# Patient Record
Sex: Male | Born: 1950 | Race: White | Hispanic: No | Smoking: Current some day smoker
Health system: Southern US, Community
[De-identification: ages and names within clinical notes are randomized; demographics above are authoritative.]

## PROBLEM LIST (undated history)

## (undated) DIAGNOSIS — R05 Cough: Secondary | ICD-10-CM

## (undated) DIAGNOSIS — I255 Ischemic cardiomyopathy: Secondary | ICD-10-CM

## (undated) DIAGNOSIS — E785 Hyperlipidemia, unspecified: Secondary | ICD-10-CM

## (undated) DIAGNOSIS — I519 Heart disease, unspecified: Secondary | ICD-10-CM

## (undated) DIAGNOSIS — I5022 Chronic systolic (congestive) heart failure: Secondary | ICD-10-CM

## (undated) DIAGNOSIS — Z95810 Presence of automatic (implantable) cardiac defibrillator: Secondary | ICD-10-CM

## (undated) DIAGNOSIS — G8929 Other chronic pain: Secondary | ICD-10-CM

## (undated) DIAGNOSIS — I480 Paroxysmal atrial fibrillation: Secondary | ICD-10-CM

## (undated) DIAGNOSIS — I2510 Atherosclerotic heart disease of native coronary artery without angina pectoris: Secondary | ICD-10-CM

## (undated) DIAGNOSIS — K829 Disease of gallbladder, unspecified: Secondary | ICD-10-CM

## (undated) DIAGNOSIS — N200 Calculus of kidney: Secondary | ICD-10-CM

## (undated) DIAGNOSIS — I482 Chronic atrial fibrillation, unspecified: Secondary | ICD-10-CM

## (undated) HISTORY — DX: Disease of gallbladder, unspecified: K82.9

## (undated) HISTORY — PX: CHOLECYSTECTOMY: SHX55

## (undated) HISTORY — DX: Calculus of kidney: N20.0

## (undated) HISTORY — DX: Heart disease, unspecified: I51.9

## (undated) HISTORY — DX: Paroxysmal atrial fibrillation: I48.0

## (undated) HISTORY — DX: Ischemic cardiomyopathy: I25.5

## (undated) HISTORY — DX: Atherosclerotic heart disease of native coronary artery without angina pectoris: I25.10

## (undated) HISTORY — DX: Other chronic pain: G89.29

---

## 2000-09-18 ENCOUNTER — Inpatient Hospital Stay (HOSPITAL_COMMUNITY): Admission: RE | Admit: 2000-09-18 | Discharge: 2000-09-19 | Payer: Self-pay | Admitting: Cardiology

## 2000-09-19 ENCOUNTER — Encounter: Payer: Self-pay | Admitting: Cardiology

## 2001-12-23 ENCOUNTER — Ambulatory Visit (HOSPITAL_COMMUNITY): Admission: RE | Admit: 2001-12-23 | Discharge: 2001-12-23 | Payer: Self-pay

## 2006-12-21 ENCOUNTER — Ambulatory Visit: Payer: Self-pay | Admitting: Internal Medicine

## 2006-12-21 ENCOUNTER — Inpatient Hospital Stay (HOSPITAL_COMMUNITY): Admission: EM | Admit: 2006-12-21 | Discharge: 2006-12-24 | Payer: Self-pay | Admitting: Emergency Medicine

## 2006-12-21 ENCOUNTER — Ambulatory Visit: Payer: Self-pay | Admitting: Cardiology

## 2006-12-25 ENCOUNTER — Ambulatory Visit: Payer: Self-pay | Admitting: Cardiology

## 2006-12-31 ENCOUNTER — Observation Stay (HOSPITAL_COMMUNITY): Admission: RE | Admit: 2006-12-31 | Discharge: 2007-01-01 | Payer: Self-pay | Admitting: Internal Medicine

## 2006-12-31 ENCOUNTER — Ambulatory Visit: Payer: Self-pay | Admitting: Internal Medicine

## 2006-12-31 HISTORY — PX: OTHER SURGICAL HISTORY: SHX169

## 2007-01-22 ENCOUNTER — Ambulatory Visit: Payer: Self-pay | Admitting: Internal Medicine

## 2007-01-26 ENCOUNTER — Ambulatory Visit: Payer: Self-pay | Admitting: Internal Medicine

## 2007-05-17 ENCOUNTER — Ambulatory Visit: Payer: Self-pay | Admitting: Internal Medicine

## 2007-07-16 ENCOUNTER — Ambulatory Visit: Payer: Self-pay | Admitting: Cardiology

## 2008-02-11 ENCOUNTER — Ambulatory Visit: Payer: Self-pay | Admitting: Internal Medicine

## 2008-03-28 ENCOUNTER — Ambulatory Visit: Payer: Self-pay | Admitting: Cardiology

## 2008-10-24 ENCOUNTER — Encounter: Payer: Self-pay | Admitting: Internal Medicine

## 2009-01-08 ENCOUNTER — Ambulatory Visit: Payer: Self-pay | Admitting: Cardiology

## 2009-02-05 ENCOUNTER — Encounter: Payer: Self-pay | Admitting: Cardiology

## 2009-02-07 ENCOUNTER — Encounter: Payer: Self-pay | Admitting: Internal Medicine

## 2009-02-07 ENCOUNTER — Ambulatory Visit: Payer: Self-pay | Admitting: Internal Medicine

## 2009-02-19 ENCOUNTER — Ambulatory Visit: Payer: Self-pay | Admitting: Cardiology

## 2009-02-28 ENCOUNTER — Encounter: Payer: Self-pay | Admitting: Physician Assistant

## 2009-02-28 ENCOUNTER — Encounter: Payer: Self-pay | Admitting: Cardiology

## 2009-03-07 ENCOUNTER — Ambulatory Visit: Payer: Self-pay | Admitting: Cardiology

## 2009-04-19 DIAGNOSIS — Z95810 Presence of automatic (implantable) cardiac defibrillator: Secondary | ICD-10-CM

## 2009-04-19 DIAGNOSIS — I255 Ischemic cardiomyopathy: Secondary | ICD-10-CM

## 2009-04-19 HISTORY — DX: Presence of automatic (implantable) cardiac defibrillator: Z95.810

## 2009-08-01 ENCOUNTER — Encounter: Payer: Self-pay | Admitting: Internal Medicine

## 2010-04-15 ENCOUNTER — Encounter (INDEPENDENT_AMBULATORY_CARE_PROVIDER_SITE_OTHER): Payer: Self-pay | Admitting: *Deleted

## 2010-06-11 ENCOUNTER — Encounter: Payer: Self-pay | Admitting: Cardiology

## 2010-06-13 ENCOUNTER — Encounter (INDEPENDENT_AMBULATORY_CARE_PROVIDER_SITE_OTHER): Payer: Self-pay | Admitting: *Deleted

## 2010-06-14 ENCOUNTER — Encounter: Payer: Self-pay | Admitting: Cardiology

## 2010-07-01 ENCOUNTER — Encounter: Payer: Self-pay | Admitting: Cardiology

## 2010-07-03 ENCOUNTER — Encounter: Payer: Self-pay | Admitting: Cardiology

## 2010-07-09 ENCOUNTER — Telehealth (INDEPENDENT_AMBULATORY_CARE_PROVIDER_SITE_OTHER): Payer: Self-pay | Admitting: *Deleted

## 2010-07-17 ENCOUNTER — Encounter: Payer: Self-pay | Admitting: Cardiology

## 2010-07-17 ENCOUNTER — Telehealth (INDEPENDENT_AMBULATORY_CARE_PROVIDER_SITE_OTHER): Payer: Self-pay | Admitting: *Deleted

## 2010-07-17 ENCOUNTER — Ambulatory Visit: Payer: Self-pay | Admitting: Cardiology

## 2010-07-17 DIAGNOSIS — I482 Chronic atrial fibrillation, unspecified: Secondary | ICD-10-CM

## 2010-07-17 DIAGNOSIS — R9431 Abnormal electrocardiogram [ECG] [EKG]: Secondary | ICD-10-CM

## 2010-07-17 DIAGNOSIS — I5022 Chronic systolic (congestive) heart failure: Secondary | ICD-10-CM | POA: Insufficient documentation

## 2010-07-31 ENCOUNTER — Telehealth: Payer: Self-pay | Admitting: Cardiology

## 2010-08-07 ENCOUNTER — Encounter (INDEPENDENT_AMBULATORY_CARE_PROVIDER_SITE_OTHER): Payer: Self-pay | Admitting: *Deleted

## 2010-08-20 ENCOUNTER — Telehealth (INDEPENDENT_AMBULATORY_CARE_PROVIDER_SITE_OTHER): Payer: Self-pay | Admitting: *Deleted

## 2010-08-21 DIAGNOSIS — E785 Hyperlipidemia, unspecified: Secondary | ICD-10-CM | POA: Insufficient documentation

## 2010-08-21 HISTORY — DX: Hyperlipidemia, unspecified: E78.5

## 2010-08-27 NOTE — Letter (Signed)
Summary: Device-Delinquent Check  [facility], Main Office  [address]   [place], [place] [address]   Phone: [phone]  Fax: [phone]     April 15, 2010 MRN: [id number]   [facility] [address] [place], [place]  [address]   Dear Mr. PULLARA,  According to our records, you have not had your implanted device checked in the recommended period of time.  We are unable to determine appropriate device function without checking your device on a regular basis.  Please call our office to schedule an appointment, with Dr. Johney Frame in [place], as soon as possible.  If you are having your device checked by another physician, please call us so that we may update our records.  Thank you,  Altha Harm, LPN  April 15, 2010 9:34 AM  [facility]

## 2010-08-27 NOTE — Letter (Signed)
Summary: Device-Delinquent Check  [facility], Main Office  [address]   [place], [place] [address]   Phone: [phone]  Fax: [phone]     June 13, 2010 MRN: [id number]   [facility] [address] [place], [place]  [address]   Dear Mr. DEMARIA,  According to our records, you have not had your implanted device checked in the recommended period of time.  We are unable to determine appropriate device function without checking your device on a regular basis.  Please call our office to schedule an appointment, with Dr Graciela Husbands ,  as soon as possible.  If you are having your device checked by another physician, please call us so that we may update our records.  Thank you,  Letta Moynahan, EMT  June 13, 2010 11:28 AM  [facility]

## 2010-08-27 NOTE — Letter (Signed)
Summary: Appointment -missed  [facility] [address]   [place], [place] [address]   Phone: [phone]  Fax: [phone]     August 01, 2009 MRN: [id number]      [facility] [address] [place], [place]  [address]      Dear Mr. BOYTE,  Our records indicate you missed your appointment on August 01, 2009                        with Dr. Johney Frame.   It is very important that we reach you to reschedule this appointment. We look forward to participating in your health care needs.   Please contact us at the number listed above at your earliest convenience to reschedule this appointment.   Sincerely,    Glass blower/designer

## 2010-08-28 ENCOUNTER — Encounter: Payer: Self-pay | Admitting: Cardiology

## 2010-08-29 NOTE — Progress Notes (Signed)
Summary: clarification on medical clearance  Phone Note Other Incoming Call back at ext. [phone] or [phone]   Caller: Dr. Gabriel Cirri - [place]  Summary of Call: Need to know if patient is clear for surgery.  Office note does not specifically mention this.   Hoover Brunette, LPN  July 17, 2010 2:13 PM   Please do addendum to office note.   Hoover Brunette, LPN  July 23, 2010 9:54 AM   Follow-up for Phone Call        chest patient is clear for surgery., I will make an addendum to the office note Follow-up by: Lewayne Bunting, MD, [facility],  July 23, 2010 11:29 AM  Additional Follow-up for Phone Call Additional follow up Details #1::        Will fax note to Dr. Gabriel Cirri.  Additional Follow-up by: Hoover Brunette, LPN,  July 23, 2010 4:52 PM

## 2010-08-29 NOTE — Progress Notes (Signed)
Summary: needs ov for cardiac clearance for gallbldder surgery  Phone Note Other Incoming Call back at [phone]    Caller: [facility] Summary of Call: Needs OV ASAP.  Needs clearance for gallbladder surgery.  Advised them that pt had not been seen since 01/2009 and also had multiple delinquent pacer checks.  Advised their office that MD would have to advise where to put on schedule.  Nothing available till late January.   Hoover Brunette, LPN  July 09, 2010 5:05 PM    Follow-up for Phone Call        Can see tomorrow on 1/2 day otherwise will have to wait until 1st available.  Follow-up by: Lewayne Bunting, MD, [facility],  July 16, 2010 4:51 AM  Additional Follow-up for Phone Call Additional follow up Details #1::        Already scheduled for 12/21 with GD.    Additional Follow-up by: Hoover Brunette, LPN,  July 16, 2010 9:00 AM

## 2010-08-29 NOTE — Letter (Signed)
Summary: Device-Delinquent Check  [facility], Main Office  [address]   [place], [place] [address]   Phone: [phone]  Fax: [phone]     August 07, 2010 MRN: [id number]   [facility] [address] [place], [place]  [address]   Dear [name],  According to our records, you have not had your implanted device checked in the recommended period of time.  We are unable to determine appropriate device function without checking your device on a regular basis.  Please call our office to schedule an appointment. with Dr Johney Frame, as soon as possible.  If you are having your device checked by another physician, please call us so that we may update our records.  Thank you,  Letta Moynahan, EMT  August 07, 2010 4:08 PM  [facility]  certified

## 2010-08-29 NOTE — Assessment & Plan Note (Signed)
Summary: pre-op clearance for gallbladder -agh   Visit Type:  Pre-op Evaluation Primary Provider:  Sherryll Burger   History of Present Illness: is a 60 year old male with a history of coronary artery disease, status post ICD implantation for primary prevention. The patient has severe ischemic cardiomyopathy with an ejection fraction of 30-35%. He has a baseline abnormal EKG with an old inferior and old anterolateral wall infarct pattern. He had multiple prior PCI's including of the LAD, circumflex and right coronary artery. He has a history of paroxysmal atrial fibrillation. He also has a history of appropriate tachycardia pacing for ventricular tachycardia.  The patient has been referred for preoperative evaluation. For quite some time now he has been suffering from abdominal pain. As a matter of fact the patient while sitting in the office complained of severe right lower quadrant pain. He states his pain has been constant for the last couple of weeks. It is intermittent and spasmodic in nature. At times he also had left-sided abdominal pain. He has underwent a CT scan of the abdomen which showed no acute abnormality although he does have bilateral nonobstructive renal calculi with the largest measuring only 3 mm. In addition he had a nuclear medicine gallbladder ultrasound done in the [facility] with an ejection fraction of 16%. He was also admitted with abdominal pain and the patient is now being considered for cholecystectomy. He also had elevated INR at that time.  The patient reports to me that he needs to be seen for preoperative clearance. He was told that he had a very abnormal EKG. However his EKG is totally unchanged from prior with an old inferior and anterior infarct pattern. He denies however any chest pain. He has chronic dyspnea. He tells me that since they stopped his Coumadin and also normal his other cardiac medications have been restarted. The patient is also currently taking aspirin. He  anticipates that surgery will be soon.the patient is a prior workup with colonoscopy several years ago but he has a repeat colonoscopy scheduled. Blood pressure in the office was however that the patient appears to have symptoms of irritable bowel syndrome particularly in the context of increased stress. He has had several bouts in his family with his brother-in-law who died in his sister who just recently had a stroke.  Preventive Screening-Counseling & Management  Alcohol-Tobacco     Smoking Status: current     Smoking Cessation Counseling: yes     Packs/Day: 1/2 PPD  Current Medications (verified): 1)  Percocet 5-325 Mg Tabs (Oxycodone-Acetaminophen) .... Take 1 Tablet By Mouth Three Times A Day 2)  Coreg 6.25 Mg Tabs (Carvedilol) .... Take 1 Tablet By Mouth Two Times A Day 3)  Lasix 20 Mg Tabs (Furosemide) .... Take 1 Tablet By Mouth Once A Day 4)  Lisinopril 5 Mg Tabs (Lisinopril) .... Take 1/2 Tab (2.5mg ) Daily 5)  Clonazepam 0.5 Mg Tabs (Clonazepam) .... Take 1/2 Tab (0.25mg ) Two Times A Day 6)  Dicyclomine Hcl 20 Mg Tabs (Dicyclomine Hcl) .... Take 1 Tablet By Mouth Three Times A Day As Needed 30 Minutes Before Meals  Allergies (verified): No Known Drug Allergies  Comments:  Nurse/Medical Assistant: The patient is currently on no medications. Old medications were removed from the medication list. Patient said he was taken off all meds by  PCP 2 weeks ago.  Past History:  Past Medical History: ICD - IN SITU (ICD-V45.02) CARDIOMYOPATHY, ISCHEMIC (ICD-414.8) Ejection fraction 30-35% Multiple PCI's involving the LAD circumflex and right coronary artery History  of atrial fibrillation. Renal calculi decreased ejection fraction gallbladder.     Social History: Packs/Day:  1/2 PPD  Review of Systems       The patient complains of blood in stool and leg swelling.  The patient denies fatigue, malaise, fever, weight gain/loss, vision loss, decreased hearing, hoarseness, chest  pain, palpitations, shortness of breath, prolonged cough, wheezing, sleep apnea, coughing up blood, abdominal pain, nausea, vomiting, diarrhea, heartburn, incontinence, blood in urine, muscle weakness, joint pain, rash, skin lesions, headache, fainting, dizziness, depression, anxiety, enlarged lymph nodes, easy bruising or bleeding, and environmental allergies.         the patient reports frequent bloating. He is on a clear liquid diet. He states that he has acute diarrhea he eats anything like tomatoes or vegetables. Abdominal pain as described  Vital Signs:  Patient profile:   60 year old male Height:      67 inches Weight:      201 pounds BMI:     31.59 Pulse rate:   91 / minute BP sitting:   110 / 73  (left arm) Cuff size:   large  Vitals Entered By: Carlye Grippe (July 17, 2010 10:00 AM)  Nutrition Counseling: Patient's BMI is greater than 25 and therefore counseled on weight management options.  Physical Exam  Additional Exam:  General:patient reports abdominal pain, right-sided. head: Normocephalic and atraumatic eyes PERRLA/EOMI intact, conjunctiva and lids normal nose: No deformity or lesions mouth normal dentition, normal posterior pharynx neck: Supple, no JVD.  No masses, thyromegaly or abnormal cervical nodes lungs: Normal breath sounds bilaterally without wheezing.  Normal percussion Chest: ICD well-positioned heart: regular rate and rhythm with normal S1 and S2, no S3 or S4.  PMI is normal.  No pathological murmurs abdomen: Normal bowel sounds, abdomen is soft and nontender without masses, organomegaly or hernias noted.  No hepatosplenomegaly, diffuse tenderness on palpation particularly right lower left lower quadrant. No rebound or guarding however. musculoskeletal: Back normal, normal gait muscle strength and tone normal pulsus: Pulse is normal in all 4 extremities Extremities: No peripheral pitting edema neurologic: Alert and oriented x 3 skin: Intact without  lesions or rashes cervical nodes: No significant adenopathy psychologic: Normal affect    EKG  Procedure date:  07/17/2010  Findings:      normal sinus rhythm. Inferior Q waves as well as anterolateral Q waves consistent with old inferior and anterior/lateral wall myocardial infarction   ICD Specifications Following MD:  Sherryl Manges, MD     ICD Vendor:  [employer] Jude     ICD Model Number:  [id number]     ICD Serial Number:  [id number] ICD DOI:  12/31/2006     ICD Implanting MD:  Sherryl Manges, MD  Lead 1:    Location: RA     DOI: 12/31/2006     Model #: [id number]     Serial #: [id number]     Status: active Lead 2:    Location: RV     DOI: 12/31/2006     Model #: [id number]     Serial #: [id number]     Status: active  Indications::  ICM, PAF   ICD Follow Up ICD Dependent:  No      Brady Parameters Mode DDD     Lower Rate Limit:  40     Upper Rate Limit 120 PAV 200     Sensed AV Delay:  180  Tachy Zones VF:  190     VT:  210  VT1:  240     Impression & Recommendations:  Problem # 1:  ELECTROCARDIOGRAM, ABNORMAL (ICD-794.31) EKG is totally unchanged from prior tracing. The patient has known ischemic cardiomyopathy with inferior and anterolateral Q waves by EKG. His ejection fraction is 35% The following medications were removed from the medication list:    Nitroglycerin 0.4 Mg Subl (Nitroglycerin) .Marland Kitchen... Dissolve one tab under tongue every 5 minutes as needed for severe chest pain, not to exceed 3 in 15 min time frame    Metoprolol Succinate 100 Mg Xr24h-tab (Metoprolol succinate) .Marland Kitchen... Take one tablet by mouth daily His updated medication list for this problem includes:    Coreg 6.25 Mg Tabs (Carvedilol) .Marland Kitchen... Take 1 tablet by mouth two times a day    Lisinopril 5 Mg Tabs (Lisinopril) .Marland Kitchen... Take 1/2 tab (2.5mg ) daily  Orders: EKG w/ Interpretation ([address])Future Orders: T-Basic Metabolic Panel [id number]) ... 07/24/2010  Problem # 2:  ICD - IN SITU (ICD-V45.02) the patient reports no  intercurrent discharges. His ICD will need to be disabled at the time of surgery. St. Jude can be called for this at the time of surgery.  Problem # 3:  CHRONIC SYSTOLIC HEART FAILURE (ICD-428.22) the patient has evidence of volume overload. He has lower extremity edema and he is currently not on any cardiac medications. Start the patient on Coreg 6.25 mg twice a day, Lasix 20 mg p.o. q. daily, lisinopril 2.5. Q. daily and we will obtain an electrolyte panel in one week. The following medications were removed from the medication list:    Nitroglycerin 0.4 Mg Subl (Nitroglycerin) .Marland Kitchen... Dissolve one tab under tongue every 5 minutes as needed for severe chest pain, not to exceed 3 in 15 min time frame    Metoprolol Succinate 100 Mg Xr24h-tab (Metoprolol succinate) .Marland Kitchen... Take one tablet by mouth daily    Digoxin 0.125 Mg Tabs (Digoxin) .Marland Kitchen... Take 1 tablet by mouth once a day His updated medication list for this problem includes:    Coreg 6.25 Mg Tabs (Carvedilol) .Marland Kitchen... Take 1 tablet by mouth two times a day    Lasix 20 Mg Tabs (Furosemide) .Marland Kitchen... Take 1 tablet by mouth once a day    Lisinopril 5 Mg Tabs (Lisinopril) .Marland Kitchen... Take 1/2 tab (2.5mg ) daily  Future Orders: T-Basic Metabolic Panel [id number]) ... 07/24/2010  Problem # 4:  ABDOMINAL PAIN OTHER SPECIFIED SITE (ICD-789.09) failure dramatic improvement in the patient's abdominal pain with dicyclomine in the office. Given the patient also prescription with dicyclomine 20 mg p.o. t.i.d. half an hour before meals in addition clonazepam p.o. b.i.d. there is a significant stress factor involved here as excess exacerbated the patient's presumed spastic colon. He may well have gallbladder disease but the pain that he was experiencing the office today is not consistent with gallbladder disease. He does have renal calculi but this has been evaluated by his primary care doctor. Future Orders: T-Basic Metabolic Panel [id number]) ...  07/24/2010  Problem # 5:  CARDIOMYOPATHY, ISCHEMIC (ICD-414.8) the patient denies any substernal chest pain. As long as he continued perioperatively on his medications he should be at relatively low risk for any cardiac complications. He does not need any ischemia testing. The following medications were removed from the medication list:    Nitroglycerin 0.4 Mg Subl (Nitroglycerin) .Marland Kitchen... Dissolve one tab under tongue every 5 minutes as needed for severe chest pain, not to exceed 3 in 15 min time frame    Metoprolol Succinate 100 Mg Xr24h-tab (Metoprolol succinate) .Marland KitchenMarland KitchenMarland KitchenMarland Kitchen  Take one tablet by mouth daily    Digoxin 0.125 Mg Tabs (Digoxin) .Marland Kitchen... Take 1 tablet by mouth once a day His updated medication list for this problem includes:    Coreg 6.25 Mg Tabs (Carvedilol) .Marland Kitchen... Take 1 tablet by mouth two times a day    Lasix 20 Mg Tabs (Furosemide) .Marland Kitchen... Take 1 tablet by mouth once a day    Lisinopril 5 Mg Tabs (Lisinopril) .Marland Kitchen... Take 1/2 tab (2.5mg ) daily  Problem # 6:  ATRIAL FIBRILLATION (ICD-427.31) patient has a history of atrial fibrillation but is in normal sinus rhythm. He has been taken off Coumadin. I suspect this is in anticipation of surgery. However patient will need to be started on Coumadin as soon as possible once surgery been completed. The following medications were removed from the medication list:    Metoprolol Succinate 100 Mg Xr24h-tab (Metoprolol succinate) .Marland Kitchen... Take one tablet by mouth daily    Digoxin 0.125 Mg Tabs (Digoxin) .Marland Kitchen... Take 1 tablet by mouth once a day His updated medication list for this problem includes:    Coreg 6.25 Mg Tabs (Carvedilol) .Marland Kitchen... Take 1 tablet by mouth two times a day  Patient Instructions: 1)  Coreg 6.25mg  two times a day 2)  Lasix 20mg  daily 3)  Lisinopril 2.5mg  daily 4)  Labs:  BMET in 7-10 days 5)  Clonazepam 0.25mg  two times a day  6)  Dicyclomine 20mg  30 minutes before meals as needed  7)  Follow up in  3  months Prescriptions: DICYCLOMINE HCL 20 MG TABS (DICYCLOMINE HCL) Take 1 tablet by mouth three times a day as needed 30 minutes before meals  #30 x 1   Entered by:   Hoover Brunette, LPN   Authorized by:   Lewayne Bunting, MD, [facility]   Signed by:   Hoover Brunette, LPN on 81/19/1478   Method used:   Electronically to        [facility] (retail)       [address]       [place], [place]  [address]       Ph: [phone]       Fax: [phone]   RxID:   [id number] CLONAZEPAM 0.5 MG TABS (CLONAZEPAM) take 1/2 tab (0.25mg ) two times a day  #15 x 0   Entered by:   Hoover Brunette, LPN   Authorized by:   Lewayne Bunting, MD, [facility]   Signed by:   Hoover Brunette, LPN on 34/74/2595   Method used:   Print then Give to Patient   RxID:   [id number] LISINOPRIL 5 MG TABS (LISINOPRIL) take 1/2 tab (2.5mg ) daily  #15 x 6   Entered by:   Hoover Brunette, LPN   Authorized by:   Lewayne Bunting, MD, [facility]   Signed by:   Hoover Brunette, LPN on 16/60/6301   Method used:   Electronically to        [facility] (retail)       [address]       [place], [place]  [address]       Ph: [phone]       Fax: [phone]   RxID:   [id number] LASIX 20 MG TABS (FUROSEMIDE) Take 1 tablet by mouth once a day  #30 x 6   Entered by:   Hoover Brunette, LPN   Authorized by:   Lewayne Bunting,  MD, [facility]   Signed by:   Hoover Brunette, LPN on 08/05/3233   Method used:   Electronically to        [facility] (retail)       [address]       [place], [place]  [address]       Ph: [phone]       Fax: [phone]   RxID:   [id number] COREG 6.25 MG TABS (CARVEDILOL) Take 1 tablet by mouth two times a day  #60 x 6   Entered by:   Hoover Brunette, LPN   Authorized by:   Lewayne Bunting, MD, [facility]   Signed by:   Hoover Brunette, LPN on 62/70/3500   Method used:   Electronically to        [facility] (retail)       [address]       [place], [place]   [address]       Ph: [phone]       Fax: [phone]   RxID:   [id number]    Medication Administration  Medication # 1:    Medication: Dicyclomine    Diagnosis: ABDOMINAL PAIN OTHER SPECIFIED SITE (ICD-789.09)    Dose: 20mg     Route: po    Comments: pain better on right than left     Patient tolerated medication without complications    Given by: Dr. Andee Lineman   Orders Added: 1)  T-Basic Metabolic Panel [id number] 2)  EKG w/ Interpretation [93[address]]   Appended Document: pre-op clearance for gallbladder -agh patient has significant underlying heart disease but is hemodynamically stable. He status post ICD implantation. He should be able to undergo gallbladder surgery at relatively low risk. ICD will need to be disabled perioperatively.

## 2010-08-29 NOTE — Progress Notes (Signed)
  Phone Note From Other Clinic   Caller: Dr. Justice Britain Summary of Call: I received a phone call from Dr. Gabriel Cirri regarding [name]. The patient is being scheduled for elective cholecystectomy. He is on Coumadin with a history of atrial fibrillation, actually off and on the medication over the last month. Dr. Marlene Bast explained that the patient had been back on Coumadin since his most recent visit with Dr. Andee Lineman and his INR level was subtherapeutic during a recent check with Dr. Sherryll Burger His typical Coumadin dose of 7.5 mg daily was augmented with a single 10 mg dose. Subsequent to this, Coumadin was held beginning approximately 4 days ago in anticipation of gallbladder surgery today, however a recheck INR was found to be 2.6 today and surgery was cancelled. Patient's history also includes ischemic cardiomyopathy with ICD. Dr. Gabriel Cirri and I discussed the situation, and I recommended that the patient continue to hold Coumadin with recheck INR on Friday. If INR level is still trending downward, could potentially reschedule surgery for Monday and coordinate ICD interrogation with a device representative on that day. I recommended that the Coumadin not be reversed with vitamin K at this point. Initial call taken by: Loreli Slot, MD, [facility],  July 31, 2010 1:15 PM     Appended Document:  Noted.

## 2010-08-29 NOTE — Medication Information (Signed)
Summary: [facility] D/C MEDICATION LIST   [facility] D/C MEDICATION LIST   Imported By: Zachary George 07/16/2010 14:23:09  _____________________________________________________________________  External Attachment:    Type:   Image     Comment:   External Document

## 2010-08-29 NOTE — Progress Notes (Signed)
Summary: Lipitor refill       Phone Note Call from Patient   Caller: VOICEMAIL MESSAGE  Summary of Call: Message left - needing Lipitor filled.  [facility].  Left message to return call.  Hoover Brunette, LPN  August 20, 2010 11:45 AM   Follow-up for Phone Call        States he was originally getting from [facility].  Stated that he would get refills from them for 3 years at a time.  At no point within that time frame did he have cholesterol labs to follow on this.  Lipitor was not on med list when here in December.   Would like to have [place] manage for him in the future.  Advised him to get labs at [facility] ([facility]) & after MD review we could supply med.  After review, GD may or may not decide to continue the Lipitor or give you a different med that he feels suitable for you.  Patient verbalized understanding.  Follow-up by: Hoover Brunette, LPN,  August 21, 2010 12:10 PM  Additional Follow-up for Phone Call Additional follow up Details #1::        yes I agree with that Additional Follow-up by: Lewayne Bunting, MD, [facility],  August 23, 2010 10:37 AM  New Problems: LONG-TERM (CURRENT) USE OF OTHER MEDICATIONS (ICD-V58.69) HYPERLIPIDEMIA (ICD-272.4)   New Problems: LONG-TERM (CURRENT) USE OF OTHER MEDICATIONS (ICD-V58.69) HYPERLIPIDEMIA (ICD-272.4)

## 2010-09-04 NOTE — Medication Information (Signed)
Summary: RX Folder/ [facility]PON[facility]  RX Folder/ [facility]ONSE   Imported By: Dorise Hiss 08/28/2010 15:31:53  _____________________________________________________________________  External Attachment:    Type:   Image     Comment:   External Document

## 2010-09-06 ENCOUNTER — Encounter: Payer: Self-pay | Admitting: Internal Medicine

## 2010-09-13 ENCOUNTER — Telehealth: Payer: Self-pay | Admitting: *Deleted

## 2010-09-24 NOTE — Cardiovascular Report (Signed)
Summary: Certified Letter Returned - Not doing f/u  Certified Letter Returned - Not doing f/u   Imported By: Debby Freiberg 09/18/2010 13:18:59  _____________________________________________________________________  External Attachment:    Type:   Image     Comment:   External Document

## 2010-10-11 ENCOUNTER — Encounter: Payer: Self-pay | Admitting: *Deleted

## 2010-10-23 ENCOUNTER — Ambulatory Visit: Payer: Self-pay | Admitting: Cardiology

## 2010-10-23 ENCOUNTER — Encounter: Payer: Self-pay | Admitting: Cardiology

## 2010-10-23 DIAGNOSIS — I2510 Atherosclerotic heart disease of native coronary artery without angina pectoris: Secondary | ICD-10-CM | POA: Insufficient documentation

## 2010-12-10 NOTE — Op Note (Signed)
[name], [name]             ACCOUNT NO.:  [id number]   MEDICAL RECORD NO.:  [id number]          PATIENT TYPE:  INP   LOCATION:  2852                         FACILITY:  [facility]   PHYSICIAN:  Duke Salvia, MD, FACCDATE OF BIRTH:  [birth year]   DATE OF PROCEDURE:  12/31/2006  DATE OF DISCHARGE:                               OPERATIVE REPORT   PREOPERATIVE DIAGNOSIS:  Ischemic cardiomyopathy, paroxysmal atrial  fibrillation.   POSTOPERATIVE DIAGNOSIS:  Ischemic cardiomyopathy, paroxysmal atrial  fibrillation.   PROCEDURE:  Dual-chamber defibrillator implantation.   Following obtaining informed consent, the patient was brought to the  electrophysiology laboratory and placed on the fluoroscopic table in  supine position.  After routine prep and drape of the right upper chest  (patient being left handed) lidocaine was infiltrated in prepectoral  groove and incision was made and carried down layer of the prepectoral  fascia electrocautery and sharp dissection.  A pocket was formed  similarly.  Hemostasis was obtained.   Thereafter, attention was turned to access to extrathoracic right  subclavian vein which was accomplished without difficulty without the  aspiration of air or puncture of the artery.  Sequentially 8-French and  7-French hemostatic tearaway introducer sheath was placed through which  was passed a St. Jude Durata 7122 single coil fact fixation  defibrillator lead serial number [id number] and a St. Jude 1688 TC active  fixation atrial lead serial number [id number].  Under fluoroscopic guidance  these were manipulated to the right ventricular apex and the right  atrial appendage respectively where the bipolar R wave was 14.2 with a  pace impedance of 835 ohms, a threshold 0.7 volts at 0.5 milliseconds,  the current at threshold 0.9 MA.  The bipolar P-wave was 3.9 with a pace  impedance of 743 ohms, a threshold 0.8 volts at 0.5 milliseconds.  Current threshold was 1.2 MA.   In both cases the current of injury was  brisk and there is no diaphragmatic stimulation at 10 volts.  These  leads were secured to prepectoral fascia and then attached to a St. Jude  Current DRRF ICD model 2207, serial number [id number].  Through the device  bipolar R wave was greater than 12 with a pace impedance of 650, a  threshold 0.5 at 0.5.  The P-wave was greater than 5 mV with a pace  impedance of 580 ohms, a threshold 0.7 at 0.5 milliseconds.  A high-  voltage impedance was 71 ohms, which resulted in a prediction of  appropriate pulse width or tilt of 50/50 were 5/3 milliseconds.   With these parameters recorded, the pocket was copiously irrigated with  antibiotic containing saline solution.  Hemostasis was assured.  The  leads and pulse generator were placed in the pocket secured to the  prepectoral fascia.   Defibrillation threshold testing was undertaken.  Ventricular  fibrillation was induced via the T-wave shock.  After a total duration  of 6.5 seconds, a 15 joules shock was delivered through a measured  resistance of 76 ohms terminating ventricular fibrillation restoring  sinus rhythm.  The device was implanted.  The wound was closed  in three  layers in  normal fashion.  Wound was washed and dried and a benzoin Steri-Strip  dressing was applied.  Needle counts, sponge counts and instrument  counts were correct at the end of procedure according to staff.  The  patient tolerated the procedure without apparent complication.      Duke Salvia, MD, [facility]  Electronically Signed     SCK/MEDQ  D:  12/31/2006  T:  12/31/2006  Job:  [id number]

## 2010-12-10 NOTE — Assessment & Plan Note (Signed)
[facility]                          [facility] NOTE   NAME:[name], [name]                    MRN:          [id number]  DATE:01/08/2009                            DOB:          08-11-1950    REFERRING PHYSICIAN:  Kirstie Peri, MD   HISTORY OF PRESENT ILLNESS:  The patient is a 60 year old male with a  history of coronary artery disease and previously implanted ICD for  primary prevention.  The patient has coronary artery disease with  multiple prior PCIs including of the LAD, circumflex and right coronary  artery.  The patient also has a prior history of atrial fibrillation.  The patient state at times his heart is still running away.  During  his last office visit, however, the patient appeared to be normal sinus  rhythm.  His episodes of palpitations, however, rather infrequent.  He  reports occasional blacking out spells always after coughing, this  occurs when he is sitting in the chair.  He unfortunately continues to  smoke.  The patient does not have any exertional syncope, however.  The  patient stated he blacked out 2 days ago when he had a severe coughing  spell.  He also had no recent chest x-ray to evaluate for underlying  lung disease.  As noted above, the patient does have a defibrillator  implanted.  He also reports double vision over the last several months  which last sometimes several hours.  He states that he was told that he  had blockages in his carotids, but that was never investigated.  He also  has sometimes difficulty walking and holding his balance.  He denies of  any chest pain, orthopnea, or PND.   MEDICATIONS:  1. Warfarin as directed.  2. Lipitor 80 mg half tab p.o. daily.  3. Digoxin 0.125 daily.  4. Aspirin 325 mg p.o. daily.  5. Metoprolol 50 mg p.o. daily.  6. Prednisone 40 mg p.o. daily.   REVIEW OF SYSTEMS:  The patient also reports symptoms consistent with  claudication bilaterally.  He states  that he is easily fatigued.  He  also reports suprapubic tenderness and dysuria and frequency.   PHYSICAL EXAMINATION:  VITAL SIGNS:  Blood pressure 119/85, heart rate  93, weight is 191 pounds.  GENERAL:  A well-nourished white male in no apparent distress.  HEENT:  Pupils are isocoric.  Conjunctivae are clear.  NECK:  Supple.  Normal carotid upstroke with no definite carotid bruits.  LUNGS:  Diminished breath sounds bilaterally with faint wheezes.  HEART:  Regular rate and rhythm.  Normal S1 and S2.  CHEST:  The ICD implant is noted in the right chest and moved over into  the anterior axillary line.  ABDOMEN:  Soft, nontender.  No rebound or guarding.  There is suprapubic  tenderness.  EXTREMITIES:  Difficult to palpate femoral pulses 1+ bilaterally, but no  bruits.  There is a dorsalis pedis pulses bilaterally, but no posterior  tibial pulses bilaterally.  There is no cyanosis.   PROBLEM LIST:  1. Ischemic cardiomyopathy, ejection fraction 30-35%.  2. Status post prior percutaneous  coronary intervention with details      above in 2008.  3. Noncompliance.  4. Cough syncope.  5. Status post implantable cardioverter-defibrillator implant with      lateral migration.  6. Rule out urinary tract infection/prostatitis.  7. Diplopia.  8. Rule out carotid artery disease.  9. Rule out peripheral vascular disease.   PLAN:  1. The patient will have a UA routine done given his complaints of      dysuria and frequency.  2. The patient requests lidocaine ointment, but unfortunately cannot      give this as there is no indication for arthritis.  3. I did give the patient a prescription for Tussionex, 1 teaspoon      q.12 for cough and we will obtain a chest x-ray to make sure that      he does not have any significant pulmonary abnormalities.  4. The patient will have carotid Dopplers done in the setting of his      diplopia and also ABIs for his claudication.  5. I asked him to discuss  with Dr. Graciela Husbands on the next visit whether      there is any indication to revise the ICD which has migrated to the      right frontal axillary line which impairs him with his seatbelt.     Learta Codding, MD,FACC  Electronically Signed    GED/MedQ  DD: 01/08/2009  DT: 01/09/2009  Job #: [id number]   cc:   Kirstie Peri, MD

## 2010-12-10 NOTE — Cardiovascular Report (Signed)
NAMESABIN, GIBEAULT             ACCOUNT NO.:  [id number]   MEDICAL RECORD NO.:  [id number]          PATIENT TYPE:  INP   LOCATION:  2921                         FACILITY:  [facility]   PHYSICIAN:  Arturo Morton. Riley Kill, MD, FACCDATE OF BIRTH:  03/17/1951   DATE OF PROCEDURE:  12/22/2006  DATE OF DISCHARGE:                            CARDIAC CATHETERIZATION   INDICATIONS:  Mr. Earwood is a 60 year old gentleman who has had a  complex past medical history.  He has had multivessel coronary  intervention at [facility].  He has been followed nicely by Dr. Call in  that capacity.  He has had known left ventricular dysfunction and the  issue of ICD has been considered.  He has had diarrhea and decreased  oral intake over the past few weeks, and basically stopped a lot of his  medications.  He is on Coumadin, aspirin, and long-term clopidogrel.  The patient was transferred emergently from the hospital in [place] to  [facility] and seen by Dr. Daleen Squibb, and subsequently referred to the  catheterization laboratory.  An informed consent was obtained from the  patient.   PROCEDURE:  1. Left heart catheterization  2. Selective coronary arteriography.  3. Selective left ventriculography.   DESCRIPTION OF PROCEDURE:  The patient was brought to the  catheterization laboratory, prepped and draped in usual fashion.  Through an anterior puncture the right femoral artery was easily  entered.  A 5-French sheath was initially placed.  Following this, views  of the left coronary artery were obtained.  There is essentially no left  main artery.  There were separate ostia for the LAD and circumflex.  The  right coronary artery was engaged.  Central aortic and left ventricular  pressures were measured with a pigtail.  Ventriculography was then  performed in the RAO projection.  There were no complications.  He was  taken off the table and into the holding area for direct manual  hemostasis.   HEMODYNAMIC  DATA:  1. Central aortic pressure 94/68, mean 81.  2. Left ventricular pressure 111/33.  3. There was no gradient on pullback across aortic valve.   ANGIOGRAPHIC DATA.:  1. Ventriculography was done in the RAO projection.  Estimated      ejection fraction would be in the range of 30-35%.  There did not      appear to be significant mitral regurgitation, although mild mitral      regurgitation cannot be excluded.  The anteroapical, apical and      distal inferior wall all appeared to be akinetic although the      inferior base and the anterior base seem to move reasonably well.  2. The left anterior descending artery has about 40% to perhaps 50%      segmental plaquing at the ostium.  The vessel then opens up and is      stented in the midportion which has 30-40% in-stent narrowing noted      in the RAO views, but this is less impressive in the RAO cranial      and LAO cranial views.  The mid and distal  LAD appear to be free of      critical disease.  3. The circumflex proper demonstrates a very large marginal branch.      There are overlapping stents going into the circumflex system.      There are multiple distal sub branches which are widely patent.      This stent itself is also patent with no more than about 10-20%      narrowing.  There is a small AV circumflex which comes off the      midportion of the stent.  This has sort of a hazy ostium.  There is      also suggestion from the right coronary injection that perhaps this      AV circumflex fills retrograde.  4. The right coronary artery has about 30-50% segmental narrowing      proximally and then there is a stent with probably 20-30%      narrowing.  The PDA and PLA are without critical narrowing.   IMPRESSION:  1. Moderately severe left ventricular dysfunction with elevated LVEDP      and wall motion abnormalities as described.  2. Continued patency of the LAD stent with moderate ostial LAD      disease, although not  critical.  3. Continued patency of the circumflex stent with subtotal occlusion      of the AV circumflex which is small.  4. Mild to moderate ostial narrowing of the right coronary artery with      continued patency of the mid vessel stent.   DISPOSITION:  The patient will likely need an EP consult.  We will get a  2-D echo to better assess overall left ventricular systolic function and  get an estimate from this.  ICD has been considered in the past.      Maisie Fus D. Riley Kill, MD, [facility]  Electronically Signed     TDS/MEDQ  D:  12/22/2006  T:  12/22/2006  Job:  [id number]   cc:   Thomas C. Daleen Squibb, MD, [facility]  [place]  Dr. Merlyn Albert Call

## 2010-12-10 NOTE — Assessment & Plan Note (Signed)
[name]                          [facility]ICE NOTE   NAME:[name], [name]                    MRN:          [id number]  DATE:02/19/2009                            DOB:          [birth year]    REFERRING PHYSICIAN:  Kirstie Peri, MD   HISTORY OF PRESENT ILLNESS:  The patient is a 60 year old male with a  history of coronary artery disease and status post ICD implantation for  primary prevention.  The patient was followed up recently by Dr. Graciela Husbands.  He had some intercurrent appropriate and tachycardia pacing.  The  patient has coronary artery disease with multiple prior PCIs including  of the LAD, circumflex, and right coronary artery.  The patient also has  a prior history of atrial fibrillation.  The patient states at times he  feels like his heart is running away.  He reported this during the last  office visit on January 08, 2009, however, at that time he appeared to be  in normal sinus rhythm.  He has had no further blacking out spells after  coughing.  He reports no exertional syncope.  He also reported dysuria  and frequency, and the UA revealed that the patient had hematuria and  bacteriuria, although he has no positive growth on culture.   CURRENT MEDICATIONS:  1. Warfarin as directed.  2. Lipitor 40 mg p.o. daily.  3. Digoxin 0.125 mg p.o. daily.  4. Aspirin 375 daily.  5. Metoprolol 50 mg p.o. daily.   PHYSICAL EXAMINATION:  VITAL SIGNS:  Blood pressure is 130/90 with a  heart rate of 108 beats per minute.  Weight is 193 pounds.  Pulse  oximetry reveals saturation of 96%.  GENERAL:  Well-nourished white male in no apparent distress.  HEENT:  Pupils isochoric.  Conjunctivae clear.  NECK:  Supple.  Normal carotid upstroke.  No definite carotid bruits.  LUNGS:  Diminished breath sounds bilaterally but no wheezing.  HEART:  Regular rate and rhythm.  Normal S1 and S2.  CHEST:  ICD implant noted with migration into the anterior  axillary  line.  ABDOMEN:  Soft, nontender.  No rebound or guarding.  Some suprapubic  tenderness.  EXTREMITIES:  Difficult to palpate femoral pulses, 1+ bilaterally.  No  bruits.  No cyanosis.   PROBLEMS:  1. Ischemic cardiomyopathy, ejection fraction 30-35%, stable, with no      intercurrent heart failure exacerbation.  2. Status post prior multiple percutaneous coronary interventions with      details above in 2008.  3. Status post implantable cardioverter-defibrillator interrogation      followed by Dr. Graciela Husbands with intercurrent antitachycardia pacing      (appropriate).  4. Cough syncope, resolved.  5. Rule out urinary tract infection/prostatitis (positive urinalysis      for bacteria and hematuria).  6. Diplopia, resolved with no evidence of carotid artery disease.  7. No evidence of peripheral vascular disease.   PLAN:  1. From a cardiac standpoint, the patient is stable.  His shortness of      breath is actually much improved.  He has no substernal chest pain.  2. I have reviewed with the patient all his intercurrent studies      including carotid Dopplers and ABIs.  They were all within normal      limits.  3. The patient's UA is abnormal, and I have given him prescription for      ciprofloxacin 250 mg p.o. q.12 x3 days.  He will have a repeat UA      done, and if still abnormal he will be referred to Urology.  4. Per Dr. Graciela Husbands, no indication for revision of ICD.  5. The patient has significant tachycardia on exam today, and I will      check an EKG to make sure he has no significant arrhythmia, i.e.,      atrial flutter or atrial fibrillation, although the latter is      unlikely given his regular rhythm.  I will also increase his      metoprolol to 100 mg p.o. daily.     [name] Codding, MD,FACC  Electronically Signed    GED/MedQ  DD: 02/19/2009  DT: 02/20/2009  Job #: [id number]   cc:   Kirstie Peri, MD

## 2010-12-10 NOTE — Discharge Summary (Signed)
NAMEKELLIN, [place]             ACCOUNT NO.:  [id number]   MEDICAL RECORD NO.:  [id number]          PATIENT TYPE:  INP   LOCATION:  3731                         FACILITY:  [facility]   PHYSICIAN:  Duke Salvia, MD, FACCDATE OF BIRTH:  02/15/1951   DATE OF ADMISSION:  12/31/2006  DATE OF DISCHARGE:  01/01/2007                               DISCHARGE SUMMARY   PRIMARY CARDIOLOGIST:  Dr. Juanito Doom.   SECONDARY PRIMARY CARDIOLOGIST:  Dr. Teodoro Kil, [place], [place].   PRIMARY CARE PHYSICIAN:  Dr. Neita Carp, [place], [place].   ELECTROPHYSIOLOGIST:  Dr. Sherryl Manges.   PROCEDURES PERFORMED DURING HOSPITALIZATION:  1. Implantation of dual-chamber defibrillator/pacemaker.      a.     This is a Production manager 7122 single-coil defibrillator,       lead serial [id number], and a St. Jude 1688TC active fixation       atrial lead, serial [id number], placed per Dr. Graciela Husbands on December 31, 2006.   DISCHARGE DIAGNOSES:  1. Ischemic cardiomyopathy.      a.     Ejection fraction decline to 30 to 35% per cath on Dec 22, 2006.  2. Atrial fibrillation.  3. Coronary artery disease.      a.     Status post multiple percutaneous coronary interventions       subsequent to myocardial infarctions in the past, all at [facility].  4. Hypertension.  5. Hyperlipidemia.  6. Coumadin therapy.  7. Status post cardiac catheterization Dec 22, 2006 revealing an EF 30      to 35%.  There did not appear to be significant mitral regurg,      although mild mitral regurgitation cannot be excluded.  The      anterior apical and distal inferior wall all appear to be akinetic      through the inferior base; the anterior base seemed to move      reasonably well.  Left anterior descending artery had about 40% to      perhaps 50% segmental plaquing at the ostium.  The vessel then      opens up to its stented in the mid portion which has 30 to 40% in-      stent narrowing noted in  the RA views, but this is less impressive      in the RAO cranial and LAD cranial views.  The mid and distal LAD      appear to be free of critical disease.  The circumflex proper      demonstrates a very large marginal branch.  There were overlapping      stents going through the circumflex system.  There are multiple      distal sub branches which are widely patent.  The stent itself is      also patent with no more than 10 to 20% narrowing.  There is a      small AV circumflex which comes off the mid portion of the stent.  This has sort of a hazy ostium.  This is also suggestion of a right      coronary ejection that perhaps this is an AV circumflex fills      retrograde.  The right coronary artery has about 30 to 50%      segmental narrowing proximally, and then there is a stent with      probably 20 to 30% narrowing.  The PDA and the PLA are without      critical narrowing.      a.     Moderate to severe left ventricular dysfunction with       elevated LVEDP and wall motion abnormalities, continued patency of       the LAD stent with moderate ostial LAD disease although not       critical, continued patency of the circumflex stent with subtotal       occlusion of the AV circumflex which is small, a mild to moderate       ostial narrowing of the right coronary artery with continued       patency of the mid vessel stent.   HOSPITAL COURSE:  This is a 60 year old Caucasian male patient of Dr.  Sherryl Manges and physicians as listed above who is admitted for  implantation of dual-chamber AICD defibrillator/pacemaker with the above-  mentioned diagnoses.  The patient was admitted on December 31, 2006 and  procedure was performed by Dr. Graciela Husbands as described above.  The patient  tolerated the procedure well without any complications, evidence of  hematoma or infection at the pacemaker site.  The patient did have good  pain control post procedure and recovered well.  The patient did have  some  continuation of chronic cough which was treated with guaifenesin.  The patient was seen and examined by Dr. Lewayne Bunting, and on discharge  had found to be stable.  The patient will be discharged home with post  AICD pacemaker implantation instructions and postoperative home care  with follow-up appointments with Dr. Graciela Husbands and primary care physician  and primary cardiologist on discharge.   LABS ON DISCHARGE:  PT 13.6, INR 1.0.  Hemoglobin 15.6, hematocrit 45.4,  white blood cells 11.9, platelets 252.  BNP 353.  Sodium 138, potassium  3.8, chloride 107, CO2 of 25, glucose 93, BUN 11, creatinine 0.87.   DISCHARGE MEDICATIONS:  1. Coumadin 75 mg p.o. daily.  2. Protonix 40 mg twice daily.  3. Zantac 150 daily at bedtime.  4. Mucinex DM 600 mg 2 in the a.m. and 2 in the p.m.  5. Prednisone 40 mg daily.  6. Toprol-XL 25 mg daily.  7. Amiodarone 200 mg daily.  8. Enteric-coated aspirin 325 daily.  9. Plavix 75 mg daily.  10.Lipitor 80 mg daily at bedtime.  11.Percocet 5/325 one to two tablets q.4h. p.r.n. pain.   ALLERGIES:  NO KNOWN DRUG ALLERGIES.   FOLLOWUP PLANS AND APPOINTMENT:  1. The patient will be followed in the [facility] on Wednesday, June      25 at 10;40 a.m. at the [facility] tertiary office.  2. The patient will follow up with Dr. Graciela Husbands on Tuesday, September 30      at noon.  3. The patient will follow up with primary care physician on his own      accord for continued medical management.  4. The patient will follow up with his primary cardiologist as well      for followup and continued cardiac  management post release by Dr.      Graciela Husbands.  5. The patient has been given post pacemaker implantation instructions      and home care and wound care.      Bettey Mare. Lyman Bishop, NP      Duke Salvia, MD, [facility]  Electronically Signed    KML/MEDQ  D:  01/01/2007  T:  01/01/2007  Job:  [id number]  cc:   Thomas C. Daleen Squibb, MD, [facility]  Dr. Teodoro Kil in [facility]

## 2010-12-10 NOTE — Assessment & Plan Note (Signed)
[facility]                          [facility] NOTE   NAME:[name], [name]                    MRN:          [id number]  DATE:03/28/2008                            DOB:          1951-05-01    HISTORY OF PRESENT ILLNESS:  The patient is a 60 year old male with a  history of ischemic cardiomyopathy.  The patient had a catheterization  in 2008 which demonstrated an ejection fraction of 30-35%.  He is also  status post ICD implant previously performed at [facility].  The patient  has a history of atrial fibrillation.  The patient was referred by Dr.  Graciela Husbands for general cardiology followup.  He has no chest pain, orthopnea,  or PND.  The patient reports left-sided flank pain.  He is concerned he  has a kidney stone.   MEDICATIONS:  1. Warfarin.  2. Lipitor.  3. Spironolactone.  4. Amiodarone, although the patient is not certain about that and he      thinks that he actually might be on metoprolol.  He will call back      to confirm.  5. Digoxin 0.125.  6. Baby aspirin 825 daily.   PHYSICAL EXAMINATION:  VITAL SIGNS:  Blood pressure is 123/82, heart  rate 84, and weight is 182.  NECK:  Normal carotid upstrokes.  No carotid bruits.  LUNGS:  Clear.  HEART:  Regular rate and rhythm with normal S1 and S2.  ABDOMEN:  Soft.  EXTREMITIES:  No edema.  Normal reflexes.   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   PROBLEM LIST:  1. Ischemic cardiomyopathy, ejection fraction of 30-35%.  2. Status post prior coronary intervention with last catheterization      in 2008, stable symptoms.  3. Noncompliance.   PLAN:  1. The patient will call back to confirm his medications.  2. I have ordered an echo to reassess his ejection fraction.  3. The patient can follow up with Dr. Graciela Husbands.  I did not make any      change in the patient's medical regime at the present time.     Learta Codding, MD,FACC  Electronically Signed    GED/MedQ  DD: 03/28/2008  DT: 03/29/2008  Job #: [id number]

## 2010-12-10 NOTE — H&P (Signed)
NAMEARMONDO, CECH             ACCOUNT NO.:  [id number]   MEDICAL RECORD NO.:  [id number]          PATIENT TYPE:  INP   LOCATION:  1823                         FACILITY:  [facility]   PHYSICIAN:  Thomas C. Wall, MD, FACCDATE OF BIRTH:  Jun 08, 1951   DATE OF ADMISSION:  12/21/2006  DATE OF DISCHARGE:                              HISTORY & PHYSICAL   PRIMARY CARDIOLOGIST:  Dr. Claris Gower in [place], [facility]   PRIMARY CARE PHYSICIAN:  Dr. Neita Carp [place]   CHIEF COMPLAINT:  Chest pain.   HISTORY OF PRESENT ILLNESS:  Mr. Kotlyar is a 60 year old male patient  with a history of coronary disease, status post multiple myocardial  infarctions in the past, treated with PCI which have all been done at  [facility] in [place], who presents in transfer  from [facility] today secondary to possible acute coronary  syndrome.  He notes a recent history of nausea, vomiting, and diarrhea  for the past week.  He has been diagnosed with gastroenteritis.  He has  not been able to take any of his medications.  This morning while he was  outside working with pool, he developed a left-sided chest discomfort  that he describes as pressure with radiation up to his left arm and  shoulder.  He had no associated diaphoresis or shortness of breath.  His  pain was similar to his previous unstable angina, and he went to the  emergency room.  In the emergency room, he was found to be sinus  tachycardia with a heart rate in the 140s.  He received nitroglycerin,  morphine, IV Lopressor x3, and one p.o. dose of Lopressor.  He had some  EKG changes that were somewhat concerning for acute coronary syndrome,  and he was transferred to [facility] Emergency Room.  He is  currently pain free.  Upon arrival, his pressure was in the 70s  systolically.  His nitroglycerin has been turned down and now has been  turned off.  He was noted to have a potassium of 2.9, and  we are  repleting that now.   PAST MEDICAL HISTORY:  1. Coronary artery disease.      a.     Status post multiple percutaneous coronary interventions       subsequent to myocardial infarctions in the past, all at [facility].  2. Hypertension.  3. Hyperlipidemia.  4. Paroxysmal atrial fibrillation.      a.     Coumadin therapy.   He denies any diabetes mellitus.   Denies any surgeries.   MEDICATIONS:  He is unsure about his medications.  From memory, he tells  [place] he is on Coreg 20 mg daily, Lipitor 40 mg daily, Coumadin, aspirin,  Plavix, lisinopril.  He thinks he is also on atenolol and a diuretic.   ALLERGIES:  NO KNOWN DRUG ALLERGIES.   SOCIAL HISTORY:  The patient lives in [place] [address] [place] with his brother and  his son.  He is divorced, has four children.  He is also disabled.  He  smokes  a quarter pack of cigarettes per day.   FAMILY HISTORY:  Significant for coronary disease in his sister as well  as his father.   REVIEW OF SYSTEMS:  Please see HPI.  Denies any fevers, chills,  headache, dysuria, hematuria, bright red blood per rectum, or melena.  He does sleep on two pillows.  Denies any paroxysmal nocturnal dyspnea.  He does have almost daily palpitations.  Denies any significant cough.  Rest of the systems are negative.   PHYSICAL EXAM:  GENERAL:  He is a well nourished, well nourished.  VITAL SIGNS:  Blood pressure 87/51, pulse 77, respirations 15.  HEENT:  Normal.  NECK:  Without JVD.  Carotids without bruits bilaterally.  Endocrine  without thyromegaly.  Lymph without lymphadenopathy.  CARDIAC:  Normal S1, S2; regular rate and rhythm without murmurs.  LUNGS:  Decreased breath sounds bilaterally.  Expiratory wheezes  diffusely.  SKIN:  Without rash.  ABDOMEN:  Soft, nontender with normoactive bowel sounds.  No  organomegaly.  EXTREMITIES: Without clubbing, cyanosis, or edema.  MUSCULOSKELETAL:  Without joint deformity.  NEUROLOGIC:  He is  alert and oriented x3.  Cranial nerves II-XII are  grossly intact.   Chest x-ray:  Report from [facility] is pending.  Upon initial  review, it appears that he has no acute disease in his chest.  EKG:  Normal sinus rhythm with a heart rate of 81.  Slight ST elevation in V1  through V3.  T-wave inversions in V2 through V6 - question acute  anterolateral MI.   LABORATORY DATA:  Initial cardiac enzymes negative.  INR 1.  AST 16, ALT  23, alkaline phosphatase 93, total protein 2.5, albumin 4.1, total  bilirubin 0.4.  Sodium 136, potassium 2.6, BUN 18, creatinine 1, glucose  138.  Hemoglobin 17.8, hematocrit 51.9, platelet count 301,000, white  count 13,000.   IMPRESSION:  1. Acute coronary syndrome.  2. Coronary artery disease.      a.     History of multiple percutaneous coronary interventions in       the past secondary to myocardial infarctions, per his report at       [facility]      b.     Records pending.  3. Hypertension.  4. Hyperlipidemia.  5. Paroxysmal atrial fibrillation.      a.     Coumadin therapy.  6. Hypokalemia.  7. Gastroenteritis.  8. Hypotension.  9. Tobacco abuse.   PLAN:  The patient was also interviewed and interviewed and examined by  Dr. Daleen Squibb.  Plan to admit him to step-down bed and treat him with  aspirin, heparin, beta blocker.  His nitroglycerin will be discontinued  secondary to his low blood pressure.  He is pain free at this time.  As  long as he remains pain free, we will plan on cardiac  catheterization tomorrow.  We will continue his isoenzymes and get his  records from [facility].  Risks and benefits of cardiac  catheterization have been explained to the patient as well as his  family, and he agrees to proceed.      Tereso Newcomer, PA-C      Jesse Sans. Daleen Squibb, MD, [facility]  Electronically Signed   SW/MEDQ  D:  12/21/2006  T:  12/21/2006  Job:  [id number]   cc:   Loney Loh, MD  Bertis Ruddy, MD, [facility]

## 2010-12-10 NOTE — Cardiovascular Report (Signed)
[facility]                   [facility] DEVICE CLINIC NOTE   NAME:[name], [name]                    MRN:          [id number]  DATE:02/07/2009                            DOB:          Nov 23, 1950    [name] is seen in followup for an ICD implanted for primary  prevention in the setting of ischemic heart disease.  He has had no  intercurrent problems with that.  (See below).   He has been having problems with some fatigue, weakness, cough as well  as some flank pain.  He has been seen by Dr. Sherryll Burger for this.  Nothing  specifically localizing has here to date been identified.   He also saw Dr. Andee Lineman recently, who undertook vascular studies results  of which are not yet back.   His current medications include Coumadin, Lipitor, digoxin, aspirin, and  metoprolol.   PHYSICAL EXAMINATION:  VITAL SIGNS:  His blood pressure today is 109/76,  his pulse is 89, his weight is 194.  NECK:  Veins were flat.  LUNGS:  Clear.  CHEST:  Normal.  There was a little bit of warmth over the defibrillator  site.  HEART:  Sounds were regular with an early systolic murmur.  ABDOMEN:  Soft.  EXTREMITIES:  Had no edema.  NEUROLOGIC:  He is in no acute distress.  SKIN:  Warm and dry.   Interrogation of his St. Jude ICD demonstrates battery voltage of 3.17  with an atrial impedance of 430, an amplitude of 5, and a threshold of  0.5.  The RV impedance was 360 with an amplitude of 11.5 and the  threshold was 1 volt of 0.5.  There are couple of episodes of  nonsustained atrial arrhythmia of relatively brief duration.   IMPRESSION:  1. Ischemic cardiomyopathy with prior percutaneous coronary      intervention.  2. Status post implantable cardioverter-defibrillator for primary      prevention.  3. Ventricular tachycardia with appropriate intercurrent ventricular      tachycardia therapy.  4. History of paroxysmal atrial fibrillation previously on  amiodarone,      but still on Coumadin.  5. Question systemic infection.   [name] is doing quite well.  There is a little warmth at his  device site.  I do not think there is any evidence of localized  infection and there has been no fevers or chills to suggest systemic  bacteremia.   We will keep an eye on this.  We will see him again in 3 months' time in  the Device Clinic.     Duke Salvia, MD, [facility]  Electronically Signed    SCK/MedQ  DD: 02/07/2009  DT: 02/08/2009  Job #: [id number]   cc:   Kirstie Peri, MD

## 2010-12-10 NOTE — Assessment & Plan Note (Signed)
[facility]                         ELECTROPHYSIOLOGY OFFICE NOTE   NAME:CAMPBELLHalton, Neas                    MRN:          [id number]  DATE:05/17/2007                            DOB:          1951/04/05    Mr. Rivet comes in with a myriad of complaints related to his  defibrillator.  His defibrillator is about 22 months old.  He described  discomfort with shifting gears.  He is also quite tender to touch.  He  mentioned to somebody, not to me, that he was on the street buying pain  medications for this as well as buying Viagra on the street.  He  describes multiple shocks in the past which are presumably related to  his atrial fibrillation.   MEDICATIONS:  1. Protonix 40 b.i.d.  2. Zantac.  3. Mucinex.  4. Prednisone 40.  5. Toprol 25.  6. Amiodarone 200.  7. Aspirin.  8. Plavix.  9. Lipitor.   PHYSICAL EXAMINATION:  VITAL SIGNS:  His blood pressure was 121/83.  His  pulse was 101.  LUNGS:  Clear.  HEART:  Sounds were regular.  EXTREMITIES:  Without edema.  CHEST:  The pacemaker pocket was well healed.  There was a little bit of  fullness just lateral to the device.  There was no overlying erythema or  calor.  It is tender.   Interrogation of his St. Jude current device demonstrates an R wave of 5  with an impedance of 465, threshold of 0.75 at 0.5, which was also true  in the RV.  The impedance was 530 and the amplitude was 12.   There were multiple episodes of false mode switch.  These were related  to a variety of phenomenon:  A.  PVCs resulting in isorhythmic  dissociation and A sense following in PVARP with inappropriate  ventricular pacing.  B.  PVCs resulting in a PMT like out rhythm.  We  have programmed his VIP on.  His outputs were reprogrammed.   I should also mention that the patient had an understanding that his  device implant procedure was complicated and long.  While I do not have  the specifics in front of me, I did  have the operative note which  described no complications.  The patient's procedure start time was  about 8:50 or so and he was out of the room at 10:10.  I do not have the  end time either, so there is some misunderstanding as to the duration of  the procedure.  The discharge summary also  describes no post implant complications.  We will plan to see him again  in 3 months' time in the device clinic.   We will need to make sure that his amiodarone surveillance labs are  being followed.     Duke Salvia, MD, [facility]  Electronically Signed    SCK/MedQ  DD: 05/17/2007  DT: 05/17/2007  Job #: [id number]

## 2010-12-10 NOTE — Discharge Summary (Signed)
[name], [name]             ACCOUNT NO.:  [id number]   MEDICAL RECORD NO.:  [id number]          PATIENT TYPE:  INP   LOCATION:  2041                         FACILITY:  [facility]   PHYSICIAN:  Jesse Sans. Wall, MD, FACCDATE OF BIRTH:  [birth year]   DATE OF ADMISSION:  12/21/2006  DATE OF DISCHARGE:  12/24/2006                               DISCHARGE SUMMARY   This patient has no known drug allergies.   Time for this dictation greater than 40 minutes.   FINAL DIAGNOSES:  1. Admission with chest pain left-sided with radiation to the left      arm, similar to anginal equivalent in past.  2. History of myocardial infarctions 1993, 1998, 2003 with      complication of ventricular fibrillation arrest, 2005.  3. Troponin I studies 0.02 then 0.02 and then 0.02.  4. Left heart catheterization May 27 showing decline in ejection      fraction from prior study August of 2005 which was 40 to 45% to 30      to 35%.  Stents which were placed are all patent.  Patient has a      stent in the mid-LAD at 30% in-stent restenosis.  The patient has a      long overlapping stent in the proximal left circumflex, no      restenosis.  The patient also has stents in the proximal-to-mid      right coronary artery which are patent.  5. History of cough syncope with pulmonary critical care consult this      admission and medical adjustment prior to discharge.   SECONDARY DIAGNOSES:  1. Coronary artery disease with history of prior myocardial      infarctions, known coronary artery disease and previous stenting.  2. Hypertension.  3. Dyslipidemia.  4. Paroxysmal atrial fibrillation/Coumadin therapy.  5. Echocardiogram October of 2005, ejection fraction 40 to 45%.  6. Strong family history of coronary artery disease.  7. Ongoing tobacco habituation.  8. Class II chronic, systolic congestive heart failure by [place]      Heart Association classification.   Patient has a decline in ejection fraction despite  maximum medical  therapy.  Patient will also receive treatment for cough syncope  consisting of Protonix twice daily, Zantac at bedtime, prednisone daily  and the choice of a cardioselective beta-blocker.   BRIEF HISTORY:  [name] is a 60 year old male.  He has known  coronary artery disease and a history of prior myocardial infarctions.  He has had stents placed at [facility] in the right  coronary artery, the proximal left circumflex and the mid-LAD.  Patient  presented with left-sided chest pain with radiation to the left arm May  26, this was similar to his known anginal equivalent in the past.  In  the emergency room, troponin I studies were 0.2 x3; his BNP was 148.  Patient will be schedule for catheterization, will continue his beta-  blocker, his ACE inhibitor.  His Coumadin is on hold and INR is  subtherapeutic allowing for a catheterization to proceed.   HOSPITAL COURSE:  Patient  presented with left-sided chest pain.  He  underwent left heart catheterization on May 2.  The study showed that  the stents in the right coronary artery, stents in the left circumflex  and the stents in the mid-LAD were all patent.  His ejection fraction  was 30 to 35%, this represents a decrease from a prior echocardiogram  October of 2005 with an ejection fraction of 40 to 45%.  Patient has  been on maximum medical therapy for coronary artery disease/congestive  heart failure/ischemic cardiomyopathy.  Because the patient has a  history of myocardial infarction and declining ejection fraction, he was  referred to electrophysiology for possible cardioverted defibrillator  implantation.  His activity levels are fairly contained.  He has a [place] Heart Association class II chronic systolic congestive heart  failure with little functional impairment.  He also has a history of  ventricular fibrillation arrest in 2003 after heart attack.  This also  would qualify him for  cardioverted defibrillator.  During history and  physical for electrophysiology, the patient mentioned that he has had  syncope five or six times usually following severe protracted coughing.  When he is driving, he simply pulls over to the side of the road when  he feels coughing come on.  To this end Dr. Graciela Husbands, electrophysiologist,  had ordered a pulmonary consult and they recommended several medications  which will be outlined in the discharge of medications list.  Patient  was discharged on May 29 to return to the hospital for cardioverted  defibrillator implant on Thursday, June 5.  [name] goes home on  the following medications:   1. Protonix 40 mg twice daily before meals.  2. Zantac 150 mg daily at bedtime.  3. Mucinex DM 600 mg tablets, two tabs in the morning, two tabs in the      evening.  4. Prednisone 40 mg daily, this is a new medication.  5. Toprol XL 25 mg daily, this is also new.  6. Amiodarone 200 mg daily.  7. Enteric-coated aspirin 325 mg daily.  8. Plavix 75 mg daily.  9. Lipitor 80 mg daily at bedtime, this is a new dose.  10.Coumadin 7.5 mg daily.  He is to start Thursday, May 29 and to stop      after his Sunday dose, June 1.   He is to stop  taking Coreg, stop taking lisinopril, stop taking  atenolol.   He will return to [facility] center at 6 o'clock in the  morning Thursday, June 5.  He is asked to eat nothing after midnight  Wednesday, June 4.  He may take his medications in the morning of June  5.   LABORATORY STUDIES THIS ADMISSION:  Serum electrolytes on May 28:  Sodium 138, potassium 3.8, chloride 107, carbonate 25, BUN is 11,  creatinine 0.87, glucose is 93.  Complete blood count on the day of discharge May 29:  Hemoglobin 15.6,  hematocrit 45.4, white cells 11.9, platelets are 252.  The protime on  admission May 26, 13.8, INR 1.0.  His alkaline phosphatase this admission was 72, SGOT 17, SGPT is 12.  Troponin I studies this   admission:  0.02 x3.  His TSH this admission was 1.951.      Maple Mirza, PA      Maisie Fus C. Daleen Squibb, MD, [facility]  Electronically Signed    GM/MEDQ  D:  12/24/2006  T:  12/24/2006  Job:  [id number]   cc:  Dr. Neita Carp  Dr. Vida Rigger, MD, [facility]

## 2010-12-10 NOTE — Cardiovascular Report (Signed)
NAMEBUNYAN, [name]             ACCOUNT NO.:  [id number]   MEDICAL RECORD NO.:  [id number]          PATIENT TYPE:  INP   LOCATION:  2921                         FACILITY:  [facility]   PHYSICIAN:  [name] Morton. Riley Kill, MD, FACCDATE OF BIRTH:  [birth year]   DATE OF PROCEDURE:  12/22/2006  DATE OF DISCHARGE:                            CARDIAC CATHETERIZATION   INDICATIONS:  [name] is a complicated 60 year old gentleman who  has had prior multivessel percutaneous coronary intervention.  He is  also had known left ventricular dysfunction.  He presented with diarrhea  and dehydration and subsequently stopped all of his medicines.  He then  presented with chest pain and a heart rate 140.  He was transferred to  [facility], seen by Dr. Daleen [name] and referred for cardiac  catheterization after improvement in his condition.   PROCEDURE:  1. Left heart catheterization  2. Selective coronary angiography.  3. Selective left ventriculography.   DESCRIPTION OF PROCEDURE:  The patient was brought to the  catheterization laboratory after informed consent and prepped and draped  in usual fashion.  Through an anterior puncture the right femoral artery  was easily entered.  A 5-French sheath was placed.  There was no left  main ostium. Views of the left and right coronaries were obtained as  well as central aortic and left ventricular pressures.  Ventriculography  was performed in the RAO projection.  We ended up using a JL-3 5  diagnostic catheter to engage the left coronary to better visualize this  vessel.  The patient tolerated the procedure well, although the LVEDP  was noted to be quite high.  He was taken to the holding area in  satisfactory clinical condition.   HEMODYNAMIC DATA.:  1. Central aortic pressure 94/68, mean 81.  2. Left ventricular pressure 111/33.  3. No gradient on pullback across the aortic valve.   ANGIOGRAPHIC DATA.:  1. Ventriculography was done in the RAO  projection.  Overall estimated      ejection fraction is in the range of 30 to 35%.  The anteroapical,      apical and distal inferior wall all appear to be severely      hypokinetic to almost dyskinetic.  The proximal basal segment      anterolaterally and the inferobasal segment all appear to move.  2. There are separate ostia for the LAD and circumflex.  3. The LAD has about 40% segmental to 50% segmental plaquing at the      ostium.  However, it does not appear to be critical.  The mid      vessel has a previously placed stent with about 30% narrowing in      the RAO views.  This is less so in the RAO cranial views.  There      are several diagonal branches all of which appear to be intact.  4. The circumflex has overlapping stents.  It overlaps a small AV      circumflex which is probably hazy and subtotally occluded.  The      distal circumflex consists of multiple bifurcating marginal  branches which are widely patent.  5. The right coronary artery has diffuse 30-50% narrowing in the      ostium and then a widely patent stent.  There is a PDA and      posterolateral branch all of which appear to be patent and there      does appear to be potentially a collateral into the AV circumflex.   IMPRESSION:  1. Moderately severe reduction in left ventricular function as noted      previously.  2. Continued patencies of the LAD, circumflex and right coronary      artery stents.  3. 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  4. 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   DISPOSITION:  At the present time the patient likely will need to be  treated medically.  He will need close follow-up and we will get an EP  consult as he may need an implantable defibrillator.  I will have Dr.  Ladona [name] see him in the hospital.      [name] Morton. Riley Kill, MD, [facility]  Electronically Signed      TDS/MEDQ  D:  12/22/2006  T:  12/22/2006  Job:  [id number]   cc:   [name] C. [name] Squibb, MD, [facility]  Dr. Merlyn Albert [name]  [name]

## 2010-12-10 NOTE — Consult Note (Signed)
[name], [name]             ACCOUNT NO.:  [id number]   MEDICAL RECORD NO.:  [id number]          PATIENT TYPE:  INP   LOCATION:  2041                         FACILITY:  [facility]   PHYSICIAN:  Charlaine Dalton. Sherene Sires, MD, FCCPDATE OF BIRTH:  07[birth year]12[birth year]1950   DATE OF CONSULTATION:  DATE OF DISCHARGE:                                 CONSULTATION   CHIEF COMPLAINT:  Cough   HISTORY:  This is a 60 year old white man says he said allergies for  years typically worse in the spring and the fall and associated with  nasal and throat congestion, with tendency to cough that is mostly  productive in the mornings of maybe a tablespoon or two of mucoid  sputum.  He is much worse over the last several months, however,  especially over the last month with coughing paroxysms that are so  severe that he passes out.  This has occurred apparently while on ACE  inhibitors (he was not real clear on his medicines, but lisinopril is  listed).  He is better since being in the hospital and of course he has  been not allowed to smoke while he is in the hospital nor is he any  longer on an ACE, though not clear when his last dose was.  He denies  any increased itching, sneezing or sinus pain over baseline or purulent  sputum, pleuritic pain, orthopnea, PND or nocturnal exacerbation of  cough.  He does complain of chronic dyspnea on exertion, but was found  to have an LVEDP of 33 on Cath on 05/27 with ejection fraction of 35%  and is maintained on nonspecific beta blockers as well.   The patient has already tried Claritin, multiple over-the-counter  antihistamines and multiple inhalers which he says maybe helps may be  not.   PAST MEDICAL HISTORY:  1. Ischemic heart disease, status post multiple percutaneous      interventions at [facility].  2. Hypertension.  3. Hyperlipidemia.  4. Paroxysmal atrial fibrillation.   ALLERGIES:  None known.   MEDICATIONS:  At present he is  maintained on amiodarone, Coreg (only at  6.25 mg b.i.d. however) Ecotrin, Lipitor, Plavix, Protonix.   SOCIAL HISTORY:  He continues to smoke maybe a 1/4 pack per day.  He is  on disability.  He is divorced with four children.  He lives in  [place].  Marland KitchenHe has no unusual travel, pet or hobby exposure history.  He has difficulty paying for his medications.   FAMILY HISTORY:  His negative for respiratory diseases. Positive for  coronary disease in his sister as well as his father.   REVIEW OF SYSTEMS:  Taken in detail and significant for occasional  reflux.   PHYSICAL EXAMINATION:  GENERAL:  This is a pleasant white male in no  acute distress.  VITAL SIGNS:  Stable.  HEENT:  Reveals middle turbinate edema and oropharynx clear.  NECK:  Supple without cervical adenopathy or tenderness. Trachea is  midline.  LUNGS:  Fields reveal slight hyperresonant to percussion with some more  pseudo wheeze than true wheeze appreciated over the upper airway.  When  he felt the urge to cough it occurred early on expiration, but he did  not have any coughing fits during inspiratory or expiratory maneuvers.  HEART:  Regular rhythm with a soft S3.  ABDOMEN:  Soft and benign with negative Hoover sign.  No palpable  ascites, palpable tenderness or palpable hepatosplenomegaly.  EXTREMITIES:  Warm without calf tenderness, cyanosis, clubbing, or  edema.  NEUROLOGIC:  No focal deficits or pathologic reflexes.  SKIN: Exam was warm and dry.   Chest x-ray:  Showed minimal increase in interstitial markings.   LAB DATA:  Included a normal BMET, BNP of 353.   IMPRESSION:  Classic cough syncope, which has occurred at least three  times in a patient who has what appears to be seasonal rhinitis with  post nasal drip syndrome on the surface, but is probably being fueled by  both reflux and ACE inhibitors.  Therefore, he obviously needs to be  maintained off of ACE inhibitors, but also treated for reflux   aggressively and part of reflux treatment of course is to have him stop  smoking completely and avoid all mint and menthol products.   I discussed all this with the patient who seemed reluctant to either  commit to taking medications directed at reflux (because he is not sure  he can afford them) or on the other hand also stopping smoking.  In this  setting I do not believe it would be safe to allow this patient to drive  a car and I told point-blank that this was the case.   Hopefully off of ACE inhibitors, although he may still have some  coughing, the upper airway component that is so severe will abate  although he still may have a chronic cough, he will not cough so hard he  has syncope.   Ideally he should also not be on non-specific beta blockers, so if the  cough persists after implementing the above recommendations I would  consider changing coreg to bisoprolol if ok with cardiology (Dr Caprice Red  has approved this substitution in similar patients if needed).      Charlaine Dalton. Sherene Sires, MD, [facility]  Electronically Signed     MBW/MEDQ  D:  12/23/2006  T:  12/24/2006  Job:  [id number]   cc:   Fara Chute

## 2010-12-10 NOTE — Cardiovascular Report (Signed)
[facility]                   [facility] DEVICE CLINIC NOTE   NAME:CAMPBELLHadley, Detloff                    MRN:          [id number]  DATE:02/11/2008                            DOB:          1950/09/02    Mr. Bee was seen in followup for coronary artery disease with  previously implanted ICD, as best as I know was implanted for primary  prevention.  He has coronary artery disease with multiple prior PCI.  He  underwent catheterization in May 2008 with patencies of the stents in  his LAD, circ, and right, and he subsequently underwent ICD implantation  as noted.  He also has a history of atrial fibrillation.   He has not been taking his medication for reasons that he is not all  that forthright about today with his son in the room.  The list was  legion and include warfarin, Plavix, Lipitor, spironolactone,  amiodarone, digoxin, NitroQuick, Viagra, and prednisone.   PHYSICAL EXAMINATION:  VITAL SIGNS:  On examination today, his blood  pressure was 119/83 with a pulse of 103.  He says that his heart rate  has been fast for years.  NECK:  His neck veins were flat.  His carotids are brisk.  HEART:  His heart sounds were regular with an early systolic murmur.  ABDOMEN:  Soft.  EXTREMITIES:  No edema.   Interrogation of his [employer] ICD demonstrates a P-wave of greater than  5 with impedance of 410.  The threshold 0.75 at 0.5 in both chambers.  The R-wave was 12.  The impedance of 410.  The intervals was greater  than 3.20.   Also, demonstrated multiple episodes of ventricular tachycardia treated  with antitachycardia pacing.   IMPRESSION:  1. Ischemic cardiomyopathy with multiple prior percutaneous coronary      intervention and patent stents most recently implanted a number of      years ago.  2. Previously implanted implantable cardioverter-defibrillator for      primary prevention with appropriate subsequent antitachycardia  pacing.  3. Previous care at [facility] and apparently he was told that he was no      longer welcome there, following his having been cared for at [facility]?  4. Difficulties with affording medications.  5. Sinus tachycardia.   Mr. Bonfanti tachycardia is not as best I can tell all that old,  reviewing the chart from 2008 at [facility], his heart rates were in  the 70s and 80s.  We will check a BMET, CBC, and TSH today to see if  they are not clarifying.   We also renewed his medications including Toprol 50 mg a day, digoxin  0.125 mg a day, Coumadin 5 mg followed by Dr. Sherril Croon and Lipitor at 80.  Currently, we will plan to hold off on his  amiodarone.  He is not taking his Plavix anyway, and it has been some  years since the stents were placed.   We will plan to see him again in 3 months' time in the Device Clinic.     Duke Salvia, MD, [facility]  Electronically Signed    SCK/MedQ  DD: 02/11/2008  DT: 02/12/2008  Job #: [id number]

## 2010-12-10 NOTE — Consult Note (Signed)
NAMEDEIDRICK, RAINEY             ACCOUNT NO.:  [id number]   MEDICAL RECORD NO.:  [id number]          PATIENT TYPE:  INP   LOCATION:  2921                         FACILITY:  [facility]   PHYSICIAN:  Duke Salvia, MD, FACCDATE OF BIRTH:  July 07, 1951   DATE OF CONSULTATION:  12/23/2006  DATE OF DISCHARGE:                                 CONSULTATION   REFERRING PHYSICIAN:  Jesse Sans. Wall, MD   REASON FOR CONSULTATION:  Thank you very much for asking [place] to see [name] in consultation for consideration of ICD implantation  for primary prevention of sudden cardiac death.   [name] is a 60 year old gentleman with complex and severe coronary  artery disease.  He has history of repeated myocardial infarctions in  1993, 1998, 2000 and 2003, the latter of which was complicated by VF  arrest.  He has had multiple stents placed including the LAD,  overlapping stents in the circumflex and a proximal stent in the RCA.  He has modest residual disease.  His ejection fraction has deteriorated  currently from a couple years ago about 45% now down to the 30% range.   He presented to the hospital this time because of chest pain and  tachycardia in the context of dehydration.  He ruled out for myocardial  infarction and underwent a catheterization which demonstrated the  results noted previously.   The patient has a history of recurrent syncope.  This has been going on  over the last couple of months and is always provoked by severe  coughing.  The patient smokes.  She says smoking doesn't cause my  problems and at least prior to this reporter's discussion, was not  willing to consider discontinuing.   The patient has tachy-palpitations and carries a diagnosis of paroxysmal  atrial fibrillation; he takes Coumadin and amiodarone.   Thromboembolic risk factors are notable for LV dysfunction and  hypertension.   PAST MEDICAL HISTORY:  In addition to the above is notable for:  1. GE  reflux disease.  2. Dyslipidemia.  3. Hypertension.   REVIEW OF SYSTEMS:  Noted on the intake sheet and is broadly negative,  apart from was noted previously.   PAST SURGICAL HISTORY:  Negative.   MEDICATIONS ON ADMISSION:  Not clear to the patient; the currently  include:  1. Aspirin.  2. Lipitor 80.  3. Plavix 75.  4. Carvedilol 6.25.  5. Amiodarone 200.  6. Protonix 40.   ALLERGIES:  He has no known drug allergies.   SOCIAL HISTORY:  He is divorced.  He lives with his brother, who has  recently been diagnosed with cancer and is anticipating new-onset  radiation therapy.  He smokes a quarter of a pack per day or so.   He does not use alcohol or recreational drugs.   EXAMINATION:  GENERAL:  He is a middle-aged Caucasian male appearing his  stated age of 60.  VITAL SIGNS:  His blood pressure 101/73.  His pulse was 88.  HEENT:  Exam demonstrated no icterus or xanthoma.  NECK:  Veins were flat.  Carotids were brisk and  full bilaterally  without bruits.  BACK:  Without kyphosis or scoliosis.  LUNGS:  Clear.  HEART:  Sounds were regular with an early systolic murmur.  ABDOMEN:  Soft with active bowel sounds, without midline pulsation or  hepatomegaly.  EXTREMITIES:  Femoral pulses were 2+.  Distal pulses were intact.  There  was no clubbing, cyanosis or edema.  NEUROLOGICAL:  Exam was grossly  normal.  SKIN:  Warm and dry.   LABORATORY AND ACCESSORY CLINICAL DATA:  Electrocardiogram dated  yesterday demonstrated sinus rhythm at 85 with intervals of  0.15/0.10/0.37.  The axis was at 100 degrees.  There was evidence of a  prior inferoposterior wall MI and evidence of a prior anterior wall MI.  There was low voltage and occasional PVCs.   Other laboratories were notable for a creatinine of 0.8, a BNP of 330, a  hemoglobin of 15.5.   IMPRESSION:  1. Ischemic heart disease.      a.     Status post multiple myocardial infarctions.      b.     Depressed left ventricular  function with ejection fraction       of 30% to 35%.      c.     Class II congestive symptoms.  2. Narrow QRS.  3. Paroxysmal atrial fibrillation, on Coumadin and amiodarone.  4. Recurrent syncope -- cough, subacute over months.   DISCUSSION:  [name] has recurrent syncope in the setting of  smoking and coughing.  This is typically a pulmonary inflammatory  process and I have ask Pulmonary to come in and consult to give [place] some  advice as how to ameliorated the situation and try to prevent further  cough and cough-induced syncope.  He is advised that he should not be  driving because of cough-induced syncope.   Of parallel importance is the issue of primary prevention of sudden  cardiac death in this relatively young gentleman with severe ischemic  heart disease.  He has had recurrent palpitations and while these have  been presumed to be atrial fibrillation, I guess it is possible that  they could represent ventricular tachycardia.  In any case, his risks of  sudden death are real and the potential benefits of life-saving therapy  such as an ICD are demonstrated in MADIT II Trial.  I have reviewed with  the patient the potential benefits as well as potential risks of ICD  implantation and he would like to proceed.  However, his brother is  beginning radiation therapy for newly diagnosed cancer this week and he  would like to be discharged and come back next week for implantation and  I think that that is a very reasonable decision.   He is also left-handed and so it will be important to implant the device  on the right side.   I have reviewed with him the potential benefits well as potential risks  including, but not limited to death, perforation, infection and device  malfunctioning and he understands these risks and is willing to proceed.   Thank you for the consultation.      Duke Salvia, MD, [facility]  Electronically Signed    SCK/MEDQ  D:  12/23/2006  T:   12/24/2006  Job:  [id number]   cc:   [facility]  [facility] Electrophysiology Laboratory  [facility]  Loney Loh MD

## 2010-12-13 NOTE — Discharge Summary (Signed)
[place]  Patient:    [name], [name]                    MRN: [id number] Adm. Date:  [id number] Disc. Date: [id number] Attending:  Talitha Givens Dictator:   Brita Romp, P.A. CC:         Dr. Fara Chute, [address], [place], [place] [address]  [facility], [place], [place]  Lewayne Bunting, M.D.   Discharge Summary  DISCHARGE DIAGNOSES: 1. Coronary artery disease. 2. Hyperlipidemia. 3. Gastroesophageal reflux disease.  HOSPITAL COURSE:  The patient received in transfer from [facility] with the diagnosis of possible unstable angina.  He was brought to [facility] for urgent catheterization.  The patient was taken to the catheterization lab by Dr. Charlies Constable.  He noted there was no left main coronary arteries since there were separate ostia of both the LAD and circumflex artery.  In the left anterior descending, the proximal and mid vessel stent had a 40% narrowing proximally and a 50% distally.  The rest of the vessel was free of major obstruction.  In the marginal branch of the circumflex artery, there were tandem 50%, 40% and 50% lesion in proximal and mid portions.  In the right coronary artery, there noted to be tandem overlying stents in the proximal region with 30% segmental narrowing in the mid to distal portions of the stent.  There was also noted to be 50% narrowing in the mid right coronary artery.  Left ventriculography showed hypokinesis in the inferior wall.  Estimated ejection fraction was 45%. Dr. Juanda Chance felt that none of the lesions appeared to be tight enough to be flow limiting or to cause ischemia.  He was uncertain of the etiology of the patients chest pain, tachycardia or elevated white count.  He discussed the findings with Dr. Antoine Poche.  The following day, the patient was seen by Dr. Bonnee Quin.  The patient stated that he felt better.  Dr. Riley Kill noted the patients heart rate, while previously fast, was  now well-controlled on Lopressor.  The EKG showed no acute changes and the patients groin looked stable.  He also noted no edema o calf tenderness and felt the patient was stable for discharge.  DISCHARGE MEDICATIONS: 1. Lipitor 20 mg q.d. 2. Lopressor 25 mg b.i.d. 3. Enteric coated aspirin 325 mg q.d. 4. Nexium as previously taken.  ACTIVITY:  Avoid heavy activity for 48 hours.  DIET:  Eat a heart-healthy diet.  SPECIAL INSTRUCTIONS:  The patient was instructed to carefully clean the wound area with soap and water.  FOLLOWUP:  On discharge, the patient is to contact the [facility] for follow-up appointment with Dr. Andee Lineman.  LABORATORY DATA AND X-RAY FINDINGS:  No lab values were obtained during this hospital stay. DD:  10/23/00 TD:  10/25/00 Job: [id number]

## 2010-12-13 NOTE — Cardiovascular Report (Signed)
[place]  Patient:    [name] [name]                    MRN: [id number] Proc. Date: 09/18/00 Adm. Date:  [id number] Disc. Date: [id number] Attending:  Talitha Givens CC:         Fara Chute, M.D.  [facility]  Cardiopulmonary Laboratory   Cardiac Catheterization  PROCEDURES PERFORMED:  Cardiac catheterization.  CLINICAL HISTORY:  Mr. Gendron is 60 years old and has documented coronary disease.  He has had multiple interventions at [facility] including tandem stents in the right coronary artery and a stent in the LAD.  His last intervention was, I believe, in 1997.  He presented to [facility] today with chest pain and tachycardia and had an elevated white count.  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  He was seen in consultation by Dr. Antoine Poche with a diagnosis of possible unstable angina and was transferred here for urgent catheterization.  DESCRIPTION OF PROCEDURE:  The procedure was performed via the right femoral artery using an arterial sheath and 6 French preformed coronary catheters.  A front wall arterial puncture was performed and Hexabrix contrast was used.  A distal aortogram was performed to rule out abdominal aortic aneurysm.  The patient tolerated the procedure well and left the laboratory in satisfactory condition.  RESULTS:  There was no left main since there were separate ostia of the LAD and circumflex artery.  Left anterior descending:  The left anterior descending artery gave rise to three diagonal branches and a large septal perforator.  The stent which was located in the proximal and mid vessel had 40% narrowing in its proximal portion and 50% narrowing in its distal portion.  The rest of the vessel was free of major obstruction.  Circumflex artery:  The circumflex artery gave rise to a large intermediate branch which bifurcated into three sub-branches.  The circumflex also  gave rise to an AV branch which terminated into two small posterolateral branches. There were tandem 50, 40 and 50 lesions in the proximal and midportion of the circumflex marginal branch.  Right coronary artery:  The right coronary is a moderate sized vessel that gave rise to two right ventricular branch, a posterior descending branch and a posterolateral branch.  There were tandem overlying stents in the proximal right coronary artery with 30% segmental narrowing in the mid to distal portions of the stents.  There was 50% narrowing in the mid right coronary artery.  LEFT VENTRICULOGRAPHY:  The left ventriculogram performed in the RAO projection showed hypokinesis of the inferior wall.  The anterolateral wall moved well.  The estimated ejection fraction was 45%.  DISTAL AORTOGRAM:  Distal aortogram was performed which showed patent renal artery stenosis and no significant aortoiliac obstruction.  The aortic pressure was 87/62 and left ventricular pressure was 87/22.  CONCLUSIONS:  Coronary artery disease, status post multiple prior percutaneous coronary interventions with 40% within the stent and the proximal LAD, 50% narrowing in the circumflex marginal vessel, 30% narrowing within the stents in the proximal right coronary artery with 50% narrowing in the mid right coronary artery and inferior wall hypokinesis.  RECOMMENDATIONS:  None of the lesions appear tight enough to be flow-limiting or to cause ischemia.  I am not certain of the etiology of the patients chest pain, tachycardia and elevated white count.  We will plan to keep him for observation and repeat his blood count tomorrow.  I discussed these findings with Dr. Antoine Poche. DD:  09/18/00 TD:  09/21/00 Job: [id number]

## 2011-01-07 ENCOUNTER — Other Ambulatory Visit: Payer: Self-pay | Admitting: Cardiology

## 2011-01-20 ENCOUNTER — Other Ambulatory Visit: Payer: Self-pay | Admitting: Cardiology

## 2011-04-14 ENCOUNTER — Other Ambulatory Visit: Payer: Self-pay | Admitting: Cardiology

## 2011-05-15 LAB — PROTIME-INR: INR: 1

## 2011-06-16 ENCOUNTER — Encounter: Payer: Self-pay | Admitting: Cardiology

## 2011-06-16 ENCOUNTER — Ambulatory Visit (INDEPENDENT_AMBULATORY_CARE_PROVIDER_SITE_OTHER): Payer: Self-pay | Admitting: Cardiology

## 2011-06-16 VITALS — BP 112/78 | HR 79 | Ht 67.0 in | Wt 196.0 lb

## 2011-06-16 DIAGNOSIS — Z79899 Other long term (current) drug therapy: Secondary | ICD-10-CM

## 2011-06-16 DIAGNOSIS — I4891 Unspecified atrial fibrillation: Secondary | ICD-10-CM

## 2011-06-16 DIAGNOSIS — R05 Cough: Secondary | ICD-10-CM

## 2011-06-16 DIAGNOSIS — I2510 Atherosclerotic heart disease of native coronary artery without angina pectoris: Secondary | ICD-10-CM

## 2011-06-16 DIAGNOSIS — E785 Hyperlipidemia, unspecified: Secondary | ICD-10-CM

## 2011-06-16 DIAGNOSIS — R054 Cough syncope: Secondary | ICD-10-CM

## 2011-06-16 DIAGNOSIS — I2589 Other forms of chronic ischemic heart disease: Secondary | ICD-10-CM

## 2011-06-16 DIAGNOSIS — R55 Syncope and collapse: Secondary | ICD-10-CM | POA: Insufficient documentation

## 2011-06-16 DIAGNOSIS — I5022 Chronic systolic (congestive) heart failure: Secondary | ICD-10-CM

## 2011-06-16 HISTORY — DX: Cough syncope: R05.4

## 2011-06-16 MED ORDER — LOSARTAN POTASSIUM 25 MG PO TABS: 25.0000 mg | ORAL_TABLET | Freq: Every day | ORAL | Status: DC

## 2011-06-16 MED ORDER — CARVEDILOL 12.5 MG PO TABS: 12.5000 mg | ORAL_TABLET | Freq: Two times a day (BID) | ORAL | Status: DC

## 2011-06-16 MED ORDER — RIVAROXABAN 20 MG PO TABS: 20.0000 mg | ORAL_TABLET | Freq: Every day | ORAL | Status: DC

## 2011-06-16 NOTE — Patient Instructions (Signed)
   Increase Carvedilol to 12.5mg  twice a day  Stop Metoprolol  Stop Lisinopril  Begin Losartan 25mg  daily  Stop Warfarin today   Begin Xarelto tomorrow (Tuesday, 06/17/11) 20mg  daily Your physician recommends that you go to the [facility] for lab work in 1 month for [employer]. If the results of your test are normal or stable, you will receive a letter.  If they are abnormal, the nurse will contact you by phone. Your physician wants you to follow up in: 6 months.  You will receive a reminder letter in the mail one-two months in advance.  If you don't receive a letter, please call our office to schedule the follow up appointment

## 2011-06-16 NOTE — Assessment & Plan Note (Signed)
No heart failure symptoms. The patient is euvolemic. Continue with current medical therapy.

## 2011-06-16 NOTE — Progress Notes (Signed)
CC: Status post cholecystectomy in a patient with ischemic heart disease  HPI: The patient is a 60 year old male with history of ischemic heart disease, status post ICD implantation, ejection fraction 30-35%. Status post prior multiple PCI's. This includes interventions to the LAD, circumflex and right coronary artery. The patient has paroxysmal atrial fibrillation but states that his PT/INR has been subtherapeutic. He also has a prior history of appropriate tachycardia pacing for ventricular tachycardia. He has done well after his cholecystectomy. He denies any chest pain. Shortness of breath orthopnea PND. He has no palpitations. The patient does report that he has a frequent cough which on several occasions has led to syncopal episodes which occur just very briefly.  PMH: reviewed and listed in Problem List in Electronic Records (and see below) Past Medical History  Diagnosis Date  . ICD (implantable cardiac defibrillator), biventricular, in situ   . Cardiomyopathy     cardiomyopathy ischemic ejection fraction 30-35%   . Arrhythmia     atrial fibrillation INR consistently subtherapeutic    . Renal calculi   . Gall bladder disease     decreased ejection fraction gallbladder status post cholecystectomy     Allergies/SH/FHX : available in Electronic Records for review Cough possibly secondary to ACE inhibitors.  Medications: Current Outpatient Prescriptions  Medication Sig Dispense Refill  . digoxin (LANOXIN) 0.125 MG tablet Take 125 mcg by mouth daily.        . furosemide (LASIX) 20 MG tablet Take 20 mg by mouth daily.        Marland Kitchen NITROSTAT 0.4 MG SL tablet DISSOLVE ONE TABLET UNDER THE TONGUE EVERY 5 MINUTES AS NEEDED FOR CHEST PAIN.  DO NOT EXCEED A TOTAL OF 3 DOSES IN 15 MINUTES  25 each  1  . omeprazole (PRILOSEC) 20 MG capsule Take 20 mg by mouth 2 (two) times daily.        Marland Kitchen warfarin (COUMADIN) 5 MG tablet Take 5 mg by mouth as directed. Per Dr. Sherryll Burger office         ROS: No nausea  or vomiting. No fever or chills.No melena or hematochezia.No bleeding.No claudication  Physical Exam: BP 112/78  Pulse 79  Ht 5\' 7"  (1.702 m)  Wt 196 lb (88.905 kg)  BMI 30.70 kg/m2 General:Well-nourished white male in no distress  Neck:Normal carotid upstroke no carotid bruits  Lungs:Clear breath sounds bilaterally no wheezing  Cardiac:Regular rate and rhythm with normal S1-S2 no murmur rubs or gallops  Vascular:No edema. Normal peripheral pulses  Skin:Warm and dry.  12lead ZOX:WRUEAV sinus rhythm  Limited bedside ECHO:N/A   Assessment and Plan

## 2011-06-16 NOTE — Assessment & Plan Note (Addendum)
No recurrent chest pain. Continue with current medical therapy. The patient was taken to beta blockers. We have discontinued metoprolol and increased carvedilol to 12.5 mg by mouth twice a day

## 2011-06-16 NOTE — Assessment & Plan Note (Signed)
Patient remains in normal sinus rhythm but INRs of been consistently subtherapeutic. I discussed with the patient the pros and cons of starting Xarelto and discontinuing Coumadin. We have given him samples and we will try to see if he does well with Xarelto. His INR was 2.1 today at asked him to stop his Coumadin today and start with Xarelto in the morning.

## 2011-06-16 NOTE — Assessment & Plan Note (Signed)
Follow by patient's primary care physician.

## 2011-06-16 NOTE — Assessment & Plan Note (Signed)
We'll discontinue lisinopril and change to losartan 25 mg by mouth daily

## 2011-07-15 ENCOUNTER — Other Ambulatory Visit: Payer: Self-pay | Admitting: Cardiology

## 2011-08-13 ENCOUNTER — Other Ambulatory Visit: Payer: Self-pay | Admitting: Cardiology

## 2011-11-08 ENCOUNTER — Other Ambulatory Visit: Payer: Self-pay | Admitting: Cardiology

## 2012-02-25 ENCOUNTER — Ambulatory Visit: Payer: Medicare Other | Admitting: Physician Assistant

## 2012-02-27 ENCOUNTER — Other Ambulatory Visit: Payer: Self-pay | Admitting: Cardiology

## 2012-03-01 DIAGNOSIS — R109 Unspecified abdominal pain: Secondary | ICD-10-CM

## 2012-03-02 DIAGNOSIS — I471 Supraventricular tachycardia: Secondary | ICD-10-CM

## 2012-03-02 DIAGNOSIS — I5023 Acute on chronic systolic (congestive) heart failure: Secondary | ICD-10-CM

## 2012-03-03 DIAGNOSIS — I4891 Unspecified atrial fibrillation: Secondary | ICD-10-CM

## 2012-03-03 DIAGNOSIS — R000 Tachycardia, unspecified: Secondary | ICD-10-CM

## 2012-03-08 ENCOUNTER — Telehealth: Payer: Self-pay | Admitting: Cardiology

## 2012-03-08 NOTE — Telephone Encounter (Signed)
Patient really concerned about his heart rate after taking new medication. It is 48. Would like nurse to call him asap

## 2012-03-08 NOTE — Telephone Encounter (Signed)
Left message for patient to call office.  

## 2012-03-09 NOTE — Telephone Encounter (Addendum)
Patient was given pacerone by Dr. Andee Lineman during hospitalization recently. Patient was informed to take 2/day for 30 days then decrease to daily.  Started taking this 2/day on Friday,Saturday and Sunday. Patient only took one yesterday due to low heart rate being 48 per monitor at [facility]. Patient denies having any dizziness or lightheaded. Patient hasn't taken HR today since he doesn't have a monitor at home but plans to purchase one. Patient did inform nurse that he saw his PCP Sherryll Burger) on yesterday and missed nurse's call due to being at [facility] having test done that were ordered by his PCP. Please advise.

## 2012-03-17 ENCOUNTER — Encounter: Payer: Self-pay | Admitting: Physician Assistant

## 2012-03-17 ENCOUNTER — Ambulatory Visit (INDEPENDENT_AMBULATORY_CARE_PROVIDER_SITE_OTHER): Payer: Medicare Other | Admitting: Physician Assistant

## 2012-03-17 VITALS — BP 102/71 | HR 93 | Ht 67.0 in | Wt 201.0 lb

## 2012-03-17 DIAGNOSIS — I4891 Unspecified atrial fibrillation: Secondary | ICD-10-CM

## 2012-03-17 DIAGNOSIS — Z95810 Presence of automatic (implantable) cardiac defibrillator: Secondary | ICD-10-CM

## 2012-03-17 DIAGNOSIS — I5022 Chronic systolic (congestive) heart failure: Secondary | ICD-10-CM

## 2012-03-17 NOTE — Progress Notes (Signed)
Primary Cardiologist: Simona Huh, MD   HPI: Post hospital followup from [facility], status post ICD discharge.  Interrogation of device indicated that this was an inappropriate shock, secondary to PAF RVR. NSR was restored. Patient had known history of PAF. He had been previously treated with Coumadin, then subsequently switched to Xarelto. He had recently run out of Xarelto, and this was resumed. Electrolytes, TSH normal.   2-D echo: EF 25-30%; apical aneurysm with false tendon, question LV thrombus  Patient was started on amiodarone at 400 mg twice a day (x3 days), then 400 mg daily (x1 month), and to be continued at 200 mg daily. Plan also was for outpatient EP referral.  Patient reports occasional palpitations since discharge; although Saturday night he awoke believing his defibrillator had gone off again, but now thinks that this was just a dream. From a heart failure standpoint, he weighs himself daily, refrains from added salt, and reportedly has lost 7 pounds since his hospitalization.   Recent labs, August 12: BUN 20, creatinine 1.1, potassium 4.0.  12-lead EKG today indicates maintenance of NSR at 88 bpm, occasional PVC   No Known Allergies  Current Outpatient Prescriptions  Medication Sig Dispense Refill  . amiodarone (PACERONE) 200 MG tablet Take 200 mg by mouth 2 (two) times daily.      . carvedilol (COREG) 12.5 MG tablet TAKE ONE TABLET BY MOUTH TWICE DAILY  60 tablet  6  . ciprofloxacin (CIPRO) 500 MG tablet Take 500 mg by mouth 2 (two) times daily.      . cyclobenzaprine (FLEXERIL) 10 MG tablet Take 10 mg by mouth as needed.      . digoxin (LANOXIN) 0.125 MG tablet Take 125 mcg by mouth daily.        . furosemide (LASIX) 40 MG tablet Take 40 mg by mouth 2 (two) times daily.      Marland Kitchen losartan (COZAAR) 25 MG tablet Take 1 tablet (25 mg total) by mouth daily.  30 tablet  6  . metroNIDAZOLE (FLAGYL) 500 MG tablet Take 500 mg by mouth 3 (three) times daily.      Marland Kitchen NITROSTAT 0.4 MG  SL tablet DISSOLVE ONE TABLET UNDER THE TONGUE EVERY 5 MINUTES AS NEEDED FOR CHEST PAIN.  DO NOT EXCEED A TOTAL OF 3 DOSES IN 15 MINUTES  25 each  2  . omeprazole (PRILOSEC) 20 MG capsule Take 20 mg by mouth 2 (two) times daily.        . potassium chloride SA (K-DUR,KLOR-CON) 20 MEQ tablet Take 20 mEq by mouth daily.      Carlena Hurl 20 MG TABS TAKE ONE TABLET BY MOUTH EVERY DAY  30 tablet  3  . DISCONTD: furosemide (LASIX) 20 MG tablet TAKE ONE TABLET BY MOUTH EVERY DAY  30 tablet  6    Past Medical History  Diagnosis Date  . ICD (implantable cardiac defibrillator), biventricular, in situ   . Cardiomyopathy     cardiomyopathy ischemic  . Arrhythmia     atrial fibrillation  . Renal calculi   . Gall bladder disease     decreased ejection fraction gallbladder    Past Surgical History  Procedure Date  . Difibrillator insertion     ICD-ST JUDE    History   Social History  . Marital Status: Married    Spouse Name: N/A    Number of Children: N/A  . Years of Education: N/A   Occupational History  . RETIRED    Social History Main Topics  .  Smoking status: Current Everyday Smoker -- 0.5 packs/day for 40 years    Types: Cigarettes  . Smokeless tobacco: Never Used  . Alcohol Use: Not on file  . Drug Use: Not on file  . Sexually Active: Not on file   Other Topics Concern  . Not on file   Social History Narrative   DISABLED ,DIVORCED    Family History  Problem Relation Age of Onset  . Coronary artery disease Other     family history of CAD    ROS: no nausea, vomiting; no fever, chills; no melena, hematochezia; no claudication  PHYSICAL EXAM: BP 102/71  Pulse 93  Ht 5\' 7"  (1.702 m)  Wt 201 lb (91.173 kg)  BMI 31.48 kg/m2 GENERAL: 61 year old male; NAD HEENT: NCAT, PERRLA, EOMI; sclera clear; no xanthelasma NECK: palpable bilateral carotid pulses, no bruits; no JVD; no TM LUNGS: CTA bilaterally CARDIAC: RRR (S1, S2); no significant murmurs; no rubs or  gallops ABDOMEN: soft, non-tender; intact BS EXTREMETIES: intact distal pulses; no significant peripheral edema SKIN: warm/dry; no obvious rash/lesions MUSCULOSKELETAL: no joint deformity NEURO: no focal deficit; NL affect   EKG: reviewed and available in Electronic Records   ASSESSMENT & PLAN:  Atrial fibrillation Maintaining NSR on amiodarone, which was recently started in hospital, per Dr. Andee Lineman. Plan is to continue amiodarone at a total of 400 mg daily (x1 month), then on a maintenance dose of 200 mg daily. Also was also recently resumed, after patient had recently run out of this prescription. We'll also continue both carvedilol and digoxin at current doses. Reassess clinical status in 3 months, with Dr. Andee Lineman.  CHRONIC SYSTOLIC HEART FAILURE Euvolemic by history and exam. Patient reportedly has lost 7 pounds since hospitalization. Recent post hospital labs indicated stable renal function. Continue current diuretic dose.  ICD - IN SITU Status post recent inappropriate ICD shock, secondary to AF RVR, converted to NSR. We will arrange patient to be seen by our EP service, here in [place], for further recommendations.    Gene Usbaldo Pannone, PAC

## 2012-03-17 NOTE — Patient Instructions (Signed)
   Amiodarone 400mg  daily (currently doing 200mg  twice a day) - should be on this x 1 month, then 200mg  daily  EP referral  Follow up in  3 months

## 2012-03-17 NOTE — Assessment & Plan Note (Signed)
Euvolemic by history and exam. Patient reportedly has lost 7 pounds since hospitalization. Recent post hospital labs indicated stable renal function. Continue current diuretic dose.

## 2012-03-17 NOTE — Assessment & Plan Note (Signed)
Status post recent inappropriate ICD shock, secondary to AF RVR, converted to NSR. We will arrange patient to be seen by our EP service, here in [place], for further recommendations.

## 2012-03-17 NOTE — Telephone Encounter (Signed)
Decrease pacerone to 200mg  qdaily and apply 24 hour Holter to monitor HR. Should have appointment in 1-2 weeks also. Patient has ICD in place so HR should not go lower then 40BPM anyway.

## 2012-03-17 NOTE — Assessment & Plan Note (Signed)
Maintaining NSR on amiodarone, which was recently started in hospital, per Dr. Andee Lineman. Plan is to continue amiodarone at a total of 400 mg daily (x1 month), then on a maintenance dose of 200 mg daily. Also was also recently resumed, after patient had recently run out of this prescription. We'll also continue both carvedilol and digoxin at current doses. Reassess clinical status in 3 months, with Dr. Andee Lineman.

## 2012-03-18 ENCOUNTER — Other Ambulatory Visit: Payer: Self-pay | Admitting: *Deleted

## 2012-03-18 MED ORDER — DIGOXIN 125 MCG PO TABS: 125.0000 ug | ORAL_TABLET | Freq: Every day | ORAL | Status: DC

## 2012-03-19 NOTE — Telephone Encounter (Signed)
Nurse informed Degent that patient seen by PA the same day he responded to phone note. MD said to disregard his previous message and follow plan of PA and office visit instructions.

## 2012-03-31 ENCOUNTER — Other Ambulatory Visit: Payer: Self-pay | Admitting: Cardiology

## 2012-04-15 ENCOUNTER — Encounter: Payer: Self-pay | Admitting: Internal Medicine

## 2012-04-15 ENCOUNTER — Ambulatory Visit (INDEPENDENT_AMBULATORY_CARE_PROVIDER_SITE_OTHER): Payer: Medicare Other | Admitting: Internal Medicine

## 2012-04-15 VITALS — BP 110/88 | HR 70 | Ht 67.0 in | Wt 199.8 lb

## 2012-04-15 DIAGNOSIS — Z79899 Other long term (current) drug therapy: Secondary | ICD-10-CM

## 2012-04-15 DIAGNOSIS — Z95810 Presence of automatic (implantable) cardiac defibrillator: Secondary | ICD-10-CM

## 2012-04-15 DIAGNOSIS — I2510 Atherosclerotic heart disease of native coronary artery without angina pectoris: Secondary | ICD-10-CM

## 2012-04-15 DIAGNOSIS — I2589 Other forms of chronic ischemic heart disease: Secondary | ICD-10-CM

## 2012-04-15 DIAGNOSIS — I4891 Unspecified atrial fibrillation: Secondary | ICD-10-CM

## 2012-04-15 DIAGNOSIS — I5022 Chronic systolic (congestive) heart failure: Secondary | ICD-10-CM

## 2012-04-15 LAB — ICD DEVICE OBSERVATION
AL AMPLITUDE: 5 mv
AL THRESHOLD: 0.75 V
ATRIAL PACING ICD: 1 pct
DEV-0020ICD: NEGATIVE
DEVICE MODEL ICD: 454266
RV LEAD THRESHOLD: 1.25 V
TZAT-0012SLOWVT: 200 ms
TZAT-0013SLOWVT: 4
TZAT-0018FASTVT: NEGATIVE
TZAT-0018SLOWVT: NEGATIVE
TZAT-0019SLOWVT: 7.5 V
TZAT-0020FASTVT: 1 ms
TZAT-0020SLOWVT: 1 ms
TZON-0003FASTVT: 285 ms
TZON-0003SLOWVT: 315 ms
TZON-0004SLOWVT: 12
TZON-0005FASTVT: 6
TZON-0005SLOWVT: 6
TZON-0010FASTVT: 80 ms
TZST-0001FASTVT: 2
TZST-0001SLOWVT: 2
TZST-0001SLOWVT: 4
TZST-0003FASTVT: 34 J
TZST-0003FASTVT: 34 J
TZST-0003SLOWVT: 30 J
TZST-0003SLOWVT: 34 J
VENTRICULAR PACING ICD: 8.4 pct

## 2012-04-15 MED ORDER — CARVEDILOL 25 MG PO TABS: 25.0000 mg | ORAL_TABLET | Freq: Two times a day (BID) | ORAL | Status: DC

## 2012-04-15 MED ORDER — AMIODARONE HCL 200 MG PO TABS: 200.0000 mg | ORAL_TABLET | Freq: Every day | ORAL | Status: DC

## 2012-04-15 NOTE — Assessment & Plan Note (Signed)
Normal ICD function See Pace Art report Zones reprogrammed to single VF zone at 211 bpm with 16 intervals

## 2012-04-15 NOTE — Patient Instructions (Addendum)
Remote monitoring is used to monitor your Pacemaker of ICD from home. This monitoring reduces the number of office visits required to check your device to one time per year. It allows [place] to keep an eye on the functioning of your device to ensure it is working properly. You are scheduled for a device check from home on July 19, 2012. You may send your transmission at any time that day. If you have a wireless device, the transmission will be sent automatically. After your physician reviews your transmission, you will receive a postcard with your next transmission date.   Your physician wants you to follow-up in: 1 year with Dr Johney Frame.  You will receive a reminder letter in the mail two months in advance. If you don't receive a letter, please call our office to schedule the follow-up appointment.  Your physician has recommended you make the following change in your medication: Decrease your amiodarone to 200 mg daily. Increased your carvedilol to 25 mg twice daily. You may take (2) of your 12.5 mg tablets twice daily until they are finished. All other medications will remain the same. Your physician recommends that you return for lab work in: today at [facility] for [facility] profile,CBC,TSH and T4.

## 2012-04-15 NOTE — Assessment & Plan Note (Signed)
No ischemic symptoms Increase coreg as tolerated   Follow-up in our general cardiology clinic

## 2012-04-15 NOTE — Progress Notes (Signed)
[facility], MD:  [name] is a 61 y.o. male with a h/o CAD and an ischemic CM sp ICD (SJM) by Dr Graciela Husbands  who presents today to reestablish care in the Electrophysiology device clinic.  He has not been recently compliant with follow-up and has not seen Dr Graciela Husbands in over three years. He recently presented with an ICD shocks due to afib with RVR.  He presented to [facility] and was initiated on amiodarone.  He is referred back to the EP clinic for further evaluation. His primary concern today is with chronic pain in his left mid abdomen.  This has been extensively evaluated (Per patient) without cause identified.  Today, he  denies symptoms of palpitations, chest pain, shortness of breath, orthopnea, PND, lower extremity edema, dizziness, presyncope, syncope, or neurologic sequela.  The patientis tolerating medications without difficulties and is otherwise without complaint today.   Past Medical History  Diagnosis Date  . Ischemic cardiomyopathy     cardiomyopathy ischemic  . Paroxysmal atrial fibrillation   . Renal calculi   . 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 Chronic systolic dysfunction of left ventricle   . Coronary artery disease   . Chronic pain     chronic left abdominal pain   Past Surgical History  Procedure Date  . Difibrillator insertion 12/31/06    SJM Current DR RF ICD implanted by Dr Graciela Husbands    History   Social History  . Marital Status: Married    Spouse Name: N/A    Number of Children: N/A  . Years of Education: N/A   Occupational History  . RETIRED    Social History Main Topics  . Smoking status: Current Some Day Smoker -- 0.5 packs/day for 40 years    Types: Cigarettes  . Smokeless tobacco: Never Used   Comment: he is not willing to quit  . Alcohol Use: No  . Drug Use: No  . Sexually Active: Not on file   Other Topics Concern  . Not on file   Social History Narrative   DISABLED ,DIVORCED    Family History    Problem Relation Age of Onset  . Coronary artery disease Other     family history of CAD    No Known Allergies  Current Outpatient Prescriptions  Medication Sig Dispense Refill  . amiodarone (PACERONE) 200 MG tablet Take 200 mg by mouth daily.       . carvedilol (COREG) 25 MG tablet Take 25 mg by mouth 2 (two) times daily with a meal.      . cyclobenzaprine (FLEXERIL) 10 MG tablet Take 10 mg by mouth as needed.      . digoxin (LANOXIN) 0.125 MG tablet Take 1 tablet (125 mcg total) by mouth daily.  30 tablet  6  . furosemide (LASIX) 40 MG tablet Take 40 mg by mouth 2 (two) times daily.      Marland Kitchen losartan (COZAAR) 25 MG tablet TAKE ONE TABLET BY MOUTH EVERY DAY  30 tablet  3  . NITROSTAT 0.4 MG SL tablet DISSOLVE ONE TABLET UNDER THE TONGUE EVERY 5 MINUTES AS NEEDED FOR CHEST PAIN.  DO NOT EXCEED A TOTAL OF 3 DOSES IN 15 MINUTES  25 each  2  . omeprazole (PRILOSEC) 20 MG capsule Take 20 mg by mouth 2 (two) times daily.        . potassium chloride SA (K-DUR,KLOR-CON) 20 MEQ tablet Take 20 mEq by mouth daily.      Marland Kitchen  XARELTO 20 MG TABS TAKE ONE TABLET BY MOUTH EVERY DAY  30 tablet  3    ROS- all systems are reviewed and negative except as per HPI  Physical Exam: Filed Vitals:   04/15/12 0818  BP: 110/88  Pulse: 70  Height: 5\' 7"  (1.702 m)  Weight: 199 lb 12.8 oz (90.629 kg)  SpO2: 94%    GEN- The patient is well appearing, alert and oriented x 3 today.   Head- normocephalic, atraumatic Eyes-  Sclera clear, conjunctiva pink Ears- hearing intact Oropharynx- clear Neck- supple, no JVP Lungs- Clear to ausculation bilaterally, normal work of breathing Chest- ICD pocket is well healed Heart- Regular rate and rhythm, no murmurs, rubs or gallops, PMI not laterally displaced GI- soft, moderatly ttp over the left flank which reproduces symptoms, no rebound/ guarding Extremities- no clubbing, cyanosis, or edema MS- no significant deformity or atrophy Skin- no rash or lesion Psych-  euthymic mood, full affect Neuro- strength and sensation are intact  Pacemaker interrogation- reviewed in detail today,  See PACEART report  Assessment and Plan:

## 2012-04-15 NOTE — Assessment & Plan Note (Signed)
Recent ICD shock for afib with RVR Decrease amiodarone to 200mg  daily today Check TFTs/LFTs As he is on xarelto, we will also check BMET and CBC today

## 2012-05-05 ENCOUNTER — Other Ambulatory Visit: Payer: Self-pay | Admitting: Cardiology

## 2012-05-05 ENCOUNTER — Other Ambulatory Visit: Payer: Self-pay | Admitting: *Deleted

## 2012-05-05 MED ORDER — FUROSEMIDE 40 MG PO TABS: 40.0000 mg | ORAL_TABLET | Freq: Two times a day (BID) | ORAL | Status: DC

## 2012-06-21 ENCOUNTER — Ambulatory Visit: Payer: Medicare Other | Admitting: Cardiology

## 2012-07-17 ENCOUNTER — Telehealth: Payer: Self-pay | Admitting: Physician Assistant

## 2012-07-17 NOTE — Telephone Encounter (Signed)
Patient called the answering service reporting that he is out of Xarelto tablets. He called his pharmacy for a refill, but was notified that there were none left. He does not know when his next follow-up appointment is. Reading his chart, he has had issues with following up in the past. Will provide 30 days of Xarelto and make appointment at [facility] office within 30 days for follow-up. Refills can be provided at that time.    Jacqulyn Bath, PA-C 07/17/2012 4:40 PM

## 2012-07-19 ENCOUNTER — Other Ambulatory Visit: Payer: Self-pay | Admitting: Cardiology

## 2012-07-19 ENCOUNTER — Encounter: Payer: Medicare Other | Admitting: *Deleted

## 2012-07-19 MED ORDER — NITROGLYCERIN 0.4 MG SL SUBL: 0.4000 mg | SUBLINGUAL_TABLET | SUBLINGUAL | Status: DC | PRN

## 2012-07-19 MED ORDER — RIVAROXABAN 20 MG PO TABS: 20.0000 mg | ORAL_TABLET | Freq: Every day | ORAL | Status: DC

## 2012-07-20 ENCOUNTER — Encounter: Payer: Self-pay | Admitting: *Deleted

## 2012-08-27 ENCOUNTER — Encounter: Payer: Self-pay | Admitting: *Deleted

## 2012-08-27 ENCOUNTER — Ambulatory Visit: Payer: Medicare Other | Admitting: Cardiology

## 2012-09-13 ENCOUNTER — Other Ambulatory Visit: Payer: Self-pay | Admitting: Cardiology

## 2012-09-13 MED ORDER — RIVAROXABAN 20 MG PO TABS: 20.0000 mg | ORAL_TABLET | Freq: Every day | ORAL | Status: DC

## 2012-09-13 MED ORDER — DIGOXIN 125 MCG PO TABS: 125.0000 ug | ORAL_TABLET | Freq: Every day | ORAL | Status: DC

## 2012-09-13 NOTE — Telephone Encounter (Signed)
RX RIFILL

## 2012-09-14 ENCOUNTER — Other Ambulatory Visit: Payer: Self-pay | Admitting: Physician Assistant

## 2012-09-30 ENCOUNTER — Encounter: Payer: Self-pay | Admitting: Adult Health

## 2012-09-30 NOTE — Progress Notes (Deleted)
   HPI: [name] is a 62 year old patient of Dr. Johney Frame with known history of CAD and ischemic heart myopathy status post ICD [employer] pacemaker. The patient is normally followed in the [facility] office and was admitted to [facility] in September of 2013 22 frequent ICD shocks in the setting of A. fib with RVR. He was initiated on amiodarone. He apparently has been followed in the past by Dr. Andee Lineman, the [facility] office or ongoing assessment and management.  No Known Allergies  Current Outpatient Prescriptions  Medication Sig Dispense Refill  . amiodarone (PACERONE) 200 MG tablet Take 1 tablet (200 mg total) by mouth daily.  30 tablet  6  . carvedilol (COREG) 25 MG tablet Take 1 tablet (25 mg total) by mouth 2 (two) times daily with a meal.  60 tablet  6  . cyclobenzaprine (FLEXERIL) 10 MG tablet Take 10 mg by mouth as needed.      . digoxin (LANOXIN) 0.125 MG tablet Take 1 tablet (125 mcg total) by mouth daily.  30 tablet  3  . furosemide (LASIX) 40 MG tablet Take 1 tablet (40 mg total) by mouth 2 (two) times daily.  60 tablet  3  . losartan (COZAAR) 25 MG tablet TAKE ONE TABLET BY MOUTH EVERY DAY  30 tablet  3  . nitroGLYCERIN (NITROSTAT) 0.4 MG SL tablet Place 1 tablet (0.4 mg total) under the tongue every 5 (five) minutes as needed for chest pain.  25 tablet  1  . omeprazole (PRILOSEC) 20 MG capsule Take 20 mg by mouth 2 (two) times daily.        . potassium chloride SA (K-DUR,KLOR-CON) 20 MEQ tablet Take 20 mEq by mouth daily.      . Rivaroxaban (XARELTO) 20 MG TABS Take 1 tablet (20 mg total) by mouth daily.  30 tablet  3   No current facility-administered medications for this visit.    Past Medical History  Diagnosis Date  . Ischemic cardiomyopathy     cardiomyopathy ischemic  . Paroxysmal atrial fibrillation   . Renal calculi   . 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 Chronic systolic dysfunction of left ventricle   . Coronary artery  disease   . Chronic pain     chronic left abdominal pain    Past Surgical History  Procedure Laterality Date  . Difibrillator insertion  12/31/06    SJM Current DR RF ICD implanted by Dr Graciela Husbands    ZOX:WRUEAV of systems complete and found to be negative unless listed above  PHYSICAL EXAM There were no vitals taken for this visit.  General: Well developed, well nourished, in no acute distress Head: Eyes PERRLA, No xanthomas.   Normal cephalic and atramatic  Lungs: Clear bilaterally to auscultation and percussion. Heart: HRRR S1 S2, without MRG.  Pulses are 2+ & equal.            No carotid bruit. No JVD.  No abdominal bruits. No femoral bruits. Abdomen: Bowel sounds are positive, abdomen soft and non-tender without masses or                  Hernia's noted. Msk:  Back normal, normal gait. Normal strength and tone for age. Extremities: No clubbing, cyanosis or edema.  DP +1 Neuro: Alert and oriented X 3. Psych:  Good affect, responds appropriately  EKG:  ASSESSMENT AND PLAN

## 2012-10-01 ENCOUNTER — Encounter: Payer: Medicare Other | Admitting: Adult Health

## 2012-10-03 NOTE — Progress Notes (Signed)
Rescheduled snow day.

## 2012-10-07 ENCOUNTER — Encounter: Payer: Self-pay | Admitting: Adult Health

## 2012-10-07 ENCOUNTER — Ambulatory Visit (INDEPENDENT_AMBULATORY_CARE_PROVIDER_SITE_OTHER): Payer: Medicare Other | Admitting: Adult Health

## 2012-10-07 ENCOUNTER — Ambulatory Visit (HOSPITAL_COMMUNITY)
Admission: RE | Admit: 2012-10-07 | Discharge: 2012-10-07 | Disposition: A | Discharge status: Home / Self Care | Payer: Medicare Other | Source: Ambulatory Visit | Attending: Adult Health | Admitting: Adult Health

## 2012-10-07 VITALS — BP 110/72 | HR 107 | Ht 67.0 in | Wt 198.0 lb

## 2012-10-07 DIAGNOSIS — I4891 Unspecified atrial fibrillation: Secondary | ICD-10-CM | POA: Insufficient documentation

## 2012-10-07 DIAGNOSIS — I2589 Other forms of chronic ischemic heart disease: Secondary | ICD-10-CM

## 2012-10-07 DIAGNOSIS — R0602 Shortness of breath: Secondary | ICD-10-CM | POA: Insufficient documentation

## 2012-10-07 DIAGNOSIS — I517 Cardiomegaly: Secondary | ICD-10-CM | POA: Insufficient documentation

## 2012-10-07 DIAGNOSIS — I2510 Atherosclerotic heart disease of native coronary artery without angina pectoris: Secondary | ICD-10-CM

## 2012-10-07 DIAGNOSIS — E785 Hyperlipidemia, unspecified: Secondary | ICD-10-CM

## 2012-10-07 DIAGNOSIS — Z95810 Presence of automatic (implantable) cardiac defibrillator: Secondary | ICD-10-CM | POA: Insufficient documentation

## 2012-10-07 LAB — COMPREHENSIVE METABOLIC PANEL
ALT: 26 U/L (ref 0–53)
CO2: 28 mEq/L (ref 19–32)
Calcium: 9.2 mg/dL (ref 8.4–10.5)
Chloride: 102 mEq/L (ref 96–112)
Creat: 1.12 mg/dL (ref 0.50–1.35)
Glucose, Bld: 91 mg/dL (ref 70–99)
Total Bilirubin: 0.5 mg/dL (ref 0.3–1.2)

## 2012-10-07 NOTE — Assessment & Plan Note (Signed)
During hospitalization he [place] in February of 2014 he was found to have A. fib RVR, had not been taking Xarelto or other medications. He was quite confused at that time. I reviewed notes completed by Dr. Andee Lineman during his evaluation of this patient. His heart rate is essentially controlled at this time on digoxin and amiodarone.Coreg was discontinued for unknown reason. I will check a BMET and a CBC for evaluation of anemia and kidney function with medication changes and use of Xarelto.Marland Kitchen

## 2012-10-07 NOTE — Progress Notes (Signed)
HPI: [name] is a 62 year old patient who was seen at [facility] by Dr. Andee Lineman that is followed in [place] office post discharge for ongoing evaluation and treatment of chronic atrial fibrillation, anemic heart myopathy, with history of St. Jude's ICD. He was admitted with confusion in February of 2014. The patient had echocardiogram which revealed LVEF of 25-30% with diastolic dysfunction. The patient had been without medications. And was restarted on this during hospitalization.   He comes today with complaints of left upper quadrant pain and flank pain on the left. He has a history of kidney stones and is due to followup with his nephrologist and by Dr. Kristian Covey in 2 days. He denies any medical noncompliance at this time. Has not been complaining of fluid retention or increased shortness of breath. He has a history of asthma and is using Symbicort inhalers for chronic wheezing and mild dyspnea on exertion. Otherwise he is without complaint with the exception of generalized fatigue. He has not had his pacemaker checked in approximately 9 months to one year.  No Known Allergies  Current Outpatient Prescriptions  Medication Sig Dispense Refill  . amiodarone (PACERONE) 200 MG tablet Take 1 tablet (200 mg total) by mouth daily.  30 tablet  6  . carvedilol (COREG) 25 MG tablet Take 1 tablet (25 mg total) by mouth 2 (two) times daily with a meal.  60 tablet  6  . cyclobenzaprine (FLEXERIL) 10 MG tablet Take 10 mg by mouth as needed.      . digoxin (LANOXIN) 0.125 MG tablet Take 1 tablet (125 mcg total) by mouth daily.  30 tablet  3  . furosemide (LASIX) 40 MG tablet Take 1 tablet (40 mg total) by mouth 2 (two) times daily.  60 tablet  3  . losartan (COZAAR) 25 MG tablet TAKE ONE TABLET BY MOUTH EVERY DAY  30 tablet  3  . nitroGLYCERIN (NITROSTAT) 0.4 MG SL tablet Place 1 tablet (0.4 mg total) under the tongue every 5 (five) minutes as needed for chest pain.  25 tablet  1  . omeprazole  (PRILOSEC) 20 MG capsule Take 20 mg by mouth 2 (two) times daily.        . potassium chloride SA (K-DUR,KLOR-CON) 20 MEQ tablet Take 20 mEq by mouth daily.      . Rivaroxaban (XARELTO) 20 MG TABS Take 1 tablet (20 mg total) by mouth daily.  30 tablet  3   No current facility-administered medications for this visit.    Past Medical History  Diagnosis Date  . Ischemic cardiomyopathy     cardiomyopathy ischemic  . Paroxysmal atrial fibrillation   . Renal calculi   . 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 Chronic systolic dysfunction of left ventricle   . Coronary artery disease   . Chronic pain     chronic left abdominal pain    Past Surgical History  Procedure Laterality Date  . Difibrillator insertion  12/31/06    SJM Current DR RF ICD implanted by Dr Graciela Husbands    ZOX:WRUEAV of systems complete and found to be negative unless listed above  PHYSICAL EXAM There were no vitals taken for this visit. General: Well developed, well nourished, in no acute distress, ruddy complexion.  Head: Eyes PERRLA, No xanthomas.   Normal cephalic and atramatic  Lungs: Inspiratory and expiratory wheezes, with frequent coughing.  Heart: HRIR S1 S2, distant heart sounds,without MRG.  Pulses are 2+ & equal.  No carotid bruit. No JVD.  No abdominal bruits. No femoral bruits. Abdomen: Bowel sounds are positive, abdomen soft and non-tender without masses or                  Hernia's noted. Msk:  Back normal, normal gait. Normal strength and tone for age. Extremities: No clubbing, cyanosis or edema.  DP +1 Neuro: Alert and oriented X 3. Psych:  Good affect, responds appropriately  [id number] fibrillation, evidence of inferior and laterolateral infarct. Rate of 107 bpm.  ASSESSMENT AND PLAN

## 2012-10-07 NOTE — Patient Instructions (Addendum)
Chest Xray today  Lab work today( CBC CMET )   Your physician recommends that you schedule a follow-up appointment in: 1 month

## 2012-10-07 NOTE — Assessment & Plan Note (Addendum)
There is no evidence of decompensation of systolic heart failure at this evaluation today. He is medically compliant but continues to have shortness of breath on exertion. I am hearing wheezes inspiratory and expiratory, and therefore will have a chest x-ray completed to evaluate more thoroughly. We will see him again in one month for ongoing evaluation and treatment close followup and encouragement with compliance compliance.   Review of echocardiogram completed during recent hospitalization in February of 20 14. Continue to demonstrated an EF of 25-30% with severely decreased left ventricular systolic function. He had multiple regional wall motion abnormalities. Was also found to have abnormal left ventricular diastolic function. If thrombus was visualized in the left ventricle. It will be important for him to continue Xarelto without fail to avoid CVA. He was noted by Dr. Andee Lineman that he felt the patient may have had a CVA secondary to medical noncompliance causing his acute mental status changes leading to recent hospitalization. Once he is on Xarelto for a minimum of 6 weeks we may need to consider TEE/ DCCV.  at Dr. Vern Claude discretion. Per echo the left atrium was mild to moderately dilated.

## 2012-10-07 NOTE — Assessment & Plan Note (Signed)
He states that he has no frank chest discomfort, but has chronic left upper quadrant pain which he said was also associated with an MI. I do not see where he has had a recent stress test although he was seen and evaluated at [facility] after medical noncompliance issues. He has had multiple PCI's including intervention of the LAD, circumflex, and right coronary artery. We will continue to follow him and consider repeating stress test should he remain symptomatic.

## 2012-10-07 NOTE — Assessment & Plan Note (Signed)
Did not find that he has had followup labs concerning this. He is due to see Dr. Sherryll Burger shortly for ongoing medical needs. It is recommended that he have fasting lipids completed for ongoing risk management.

## 2012-10-08 ENCOUNTER — Encounter: Payer: Self-pay | Admitting: *Deleted

## 2012-10-08 LAB — CBC
MCH: 32.3 pg (ref 26.0–34.0)
MCHC: 34.5 g/dL (ref 30.0–36.0)
MCV: 93.6 fL (ref 78.0–100.0)
Platelets: 223 10*3/uL (ref 150–400)
RBC: 5.3 MIL/uL (ref 4.22–5.81)
RDW: 13.4 % (ref 11.5–15.5)

## 2012-11-09 ENCOUNTER — Encounter: Payer: Self-pay | Admitting: Adult Health

## 2012-11-09 ENCOUNTER — Ambulatory Visit (INDEPENDENT_AMBULATORY_CARE_PROVIDER_SITE_OTHER): Payer: Medicare Other | Admitting: *Deleted

## 2012-11-09 ENCOUNTER — Ambulatory Visit (INDEPENDENT_AMBULATORY_CARE_PROVIDER_SITE_OTHER): Payer: Medicare Other | Admitting: Adult Health

## 2012-11-09 ENCOUNTER — Encounter: Payer: Self-pay | Admitting: *Deleted

## 2012-11-09 ENCOUNTER — Other Ambulatory Visit: Payer: Self-pay | Admitting: Internal Medicine

## 2012-11-09 VITALS — BP 110/77 | HR 100 | Ht 67.0 in | Wt 194.0 lb

## 2012-11-09 DIAGNOSIS — I5022 Chronic systolic (congestive) heart failure: Secondary | ICD-10-CM

## 2012-11-09 DIAGNOSIS — I2589 Other forms of chronic ischemic heart disease: Secondary | ICD-10-CM

## 2012-11-09 DIAGNOSIS — R0600 Dyspnea, unspecified: Secondary | ICD-10-CM

## 2012-11-09 DIAGNOSIS — R0609 Other forms of dyspnea: Secondary | ICD-10-CM

## 2012-11-09 DIAGNOSIS — R0602 Shortness of breath: Secondary | ICD-10-CM

## 2012-11-09 LAB — ICD DEVICE OBSERVATION
AL AMPLITUDE: 3.4 mv
AL IMPEDENCE ICD: 412.5 Ohm
BAMS-0001: 180 {beats}/min
BATTERY VOLTAGE: 2.5869 V
BRDY-0002RV: 40 {beats}/min
FVT: 0
HV IMPEDENCE: 70 Ohm
RV LEAD AMPLITUDE: 11.5 mv
RV LEAD IMPEDENCE ICD: 325 Ohm
TOT-0007: 3
TOT-0008: 0
TOT-0009: 1
TOT-0010: 38
VENTRICULAR PACING ICD: 5.8 pct
VF: 0

## 2012-11-09 MED ORDER — TRAMADOL HCL 50 MG PO TABS: 50.0000 mg | ORAL_TABLET | Freq: Four times a day (QID) | ORAL | Status: DC | PRN

## 2012-11-09 MED ORDER — DIGOXIN 250 MCG PO TABS: 250.0000 ug | ORAL_TABLET | Freq: Every day | ORAL | Status: DC

## 2012-11-09 NOTE — Patient Instructions (Addendum)
Your physician recommends that you schedule a follow-up appointment in:  1 - 1 -2 weeks with Dr Ladona Ridgel 2 - 3 months with Samara Deist  Your physician has recommended that you have a sleep study. This test records several body functions during sleep, including: brain activity, eye movement, oxygen and carbon dioxide blood levels, heart rate and rhythm, breathing rate and rhythm, the flow of air through your mouth and nose, snoring, body muscle movements, and chest and belly movement.  Your physician has recommended you make the following change in your medication:  1 - INCREASE Digoxin to 0.25 mg  2 - Tramadol 50 mg every 6 hours as needed Polysomnography (Sleep Studies) Polysomnography (PSG) is a series of tests used for detecting (diagnosing) obstructive sleep apnea and other sleep disorders. The tests measure how some parts of your body are working while you are sleeping. The tests are extensive and expensive. They are done in a sleep lab or hospital, and vary from center to center. Your caregiver may perform other more simple sleep studies and questionnaires before doing more complete and involved testing. Testing may not be covered by insurance. Some of these tests are:  An EEG (Electroencephalogram). This tests your brain waves and stages of sleep.  An EOG (Electrooculogram). This measures the movements of your eyes. It detects periods of REM (rapid eye movement) sleep, which is your dream sleep.  An EKG (Electrocardiogram). This measures your heart rhythm.  EMG (Electromyography). This is a measurement of how the muscles are working in your upper airway and your legs while sleeping.  An oximetry measurement. It measures how much oxygen (air) you are getting while sleeping.  Breathing efforts may be measured. The same test can be interpreted (understood) differently by different caregivers and centers that study sleep.  Studies may be given an apnea/hypopnea index (AHI). This is a number  which is found by counting the times of no breathing or under breathing during the night, and relating those numbers to the amount of time spent in bed. When the AHI is greater than 15, the patient is likely to complain of daytime sleepiness. When the AHI is greater than 30, the patient is at increased risk for heart problems and must be followed more closely. Following the AHI also allows you to know how treatment is working. Simple oximetry (tracking the amount of oxygen that is taken in) can be used for screening patients who:  Do not have symptoms (problems) of OSA.  Have a normal Epworth Sleepiness Scale Score.  Have a low pre-test probability of having OSA.  Have none of the upper airway problems likely to cause apnea.  Oximetry is also used to determine if treatment is effective in patients who showed significant desaturations (not getting enough oxygen) on their home sleep study. One extra measure of safety is to perform additional studies for the person who only snores. This is because no one can predict with absolute certainty who will have OSA. Those who show significant desaturations (not getting enough oxygen) are recommended to have a more detailed sleep study. Document Released: 01/18/2003 Document Revised: 10/06/2011 Document Reviewed: 07/14/2005 [facility] Patient Information 2013 [place], [place].

## 2012-11-09 NOTE — Assessment & Plan Note (Signed)
He continues to have a right chest pain at the site of his pacemaker, most often with seatbelt deployment. I have given him a letter allowing him to clear the shoulder shrapnel just under his arm but continue to wear his seat belt while driving. Have given in a prescription for tramadol 50 mg 1 by mouth every 6 hours when necessary discomfort.

## 2012-11-09 NOTE — Assessment & Plan Note (Signed)
The patient is complaining of dyspnea on exertion, fatigue, and insomnia. He admits to snoring. He has never had a sleep study completed. We will schedule this to evaluate for obstructive sleep apnea which is also contributing to his atrial fibrillation.

## 2012-11-09 NOTE — Progress Notes (Signed)
HPI [name] is a 61 year old patient of Dr. Daleen Squibb, formerly Dr. Andee Lineman, we are following for ongoing assessment and management of chronic atrial fibrillation, ischemic cardiomyopathy, with history of [employer] ICD. He was last seen in the office on 10/07/2012 with complaints of abdominal pain, with history of kidney stones. The patient also was having some chronic wheezing and dyspnea on exertion, but is using Symbicort and is finding relief. The patient last echocardiogram in fair 2014 demonstrated an EF of 25-30% with severely decreased left ventricle systolic function. The patient has been on Xarelto, with plan for DC cardioversion, after being followed by Dr. Daleen Squibb. On last visit the patient was followed with a BMET and a CBC. The patient's globe and was found to be 17.1 with hematocrit 49.2 BMET was unremarkable with a creatinine of 1.12.   With complaints of generalized fatigue, dyspnea on exertion, and insomnia. He is also very uncomfortable at his pacemaker site in the upper right chest. He states his seatbelt causes him to have a lot of pain in the air when he puts on his brakes and the seatbelt tightened around that area. His pacemaker has been interrogated here in the office and found that his heart rate is running in 100-110 beats per minute range. He denies medical noncompliance.  No Known Allergies  Current Outpatient Prescriptions  Medication Sig Dispense Refill  . amiodarone (PACERONE) 200 MG tablet Take 1 tablet (200 mg total) by mouth daily.  30 tablet  6  . budesonide-formoterol (SYMBICORT) 160-4.5 MCG/ACT inhaler Inhale 2 puffs into the lungs 2 (two) times daily.      . digoxin (LANOXIN) 0.125 MG tablet Take 1 tablet (125 mcg total) by mouth daily.  30 tablet  3  . furosemide (LASIX) 40 MG tablet Take 1 tablet (40 mg total) by mouth 2 (two) times daily.  60 tablet  3  . losartan (COZAAR) 25 MG tablet TAKE ONE TABLET BY MOUTH EVERY DAY  30 tablet  3  . nitroGLYCERIN (NITROSTAT)  0.4 MG SL tablet Place 1 tablet (0.4 mg total) under the tongue every 5 (five) minutes as needed for chest pain.  25 tablet  1  . omeprazole (PRILOSEC) 20 MG capsule Take 20 mg by mouth 2 (two) times daily.        . Rivaroxaban (XARELTO) 20 MG TABS Take 1 tablet (20 mg total) by mouth daily.  30 tablet  3   No current facility-administered medications for this visit.    Past Medical History  Diagnosis Date  . Ischemic cardiomyopathy     cardiomyopathy ischemic  . Paroxysmal atrial fibrillation   . Renal calculi   . 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 Chronic systolic dysfunction of left ventricle   . Coronary artery disease   . Chronic pain     chronic left abdominal pain    Past Surgical History  Procedure Laterality Date  . Difibrillator insertion  12/31/06    SJM Current DR RF ICD implanted by Dr Graciela Husbands    ZOX:WRUEAV of systems complete and found to be negative unless listed above  PHYSICAL EXAM BP 110/77  Pulse 100  Ht 5\' 7"  (1.702 m)  Wt 194 lb (87.998 kg)  BMI 30.38 kg/m2  SpO2 95%  General: Well developed, well nourished, in no acute distress Head: Eyes PERRLA, No xanthomas.   Normal cephalic and atramatic  Lungs: Clear bilaterally to auscultation and percussion. Heart: HRIR S1 S2, tachycardic  without MRG.  Pulses are 2+ & equal.            No carotid bruit. No JVD.  No abdominal bruits. No femoral bruits. Abdomen: Bowel sounds are positive, abdomen soft and non-tender without masses or                  Hernia's noted. Msk:  Back normal, normal gait. Normal strength and tone for age. Tender right upper chest at the site of pacemaker. Extremities: No clubbing, cyanosis or edema.  DP +1 Neuro: Alert and oriented X 3. Psych:  Good affect, responds appropriately    ASSESSMENT AND PLAN

## 2012-11-09 NOTE — Progress Notes (Signed)
ICD check 

## 2012-11-09 NOTE — Progress Notes (Deleted)
Name: [name]    DOB: 03/17/1951  Age: 62 y.o.  MR#: [id number]       PCP:  Kirstie Peri, MD      Insurance: Payor: MEDICARE  Plan: MEDICARE PART A AND B  Product Type: *No Product type*    CC:    Chief Complaint  Patient presents with  . Coronary Artery Disease  . Atrial Fibrillation  PT STATES HE FEELS ROUGH TODAY, DENIES CHEST PAIN/ADVISED THAT HE FEELS ACHY ALL OVER  VS Filed Vitals:   11/09/12 1315  BP: 110/77  Pulse: 100  Height: 5\' 7"  (1.702 m)  Weight: 194 lb (87.998 kg)  SpO2: 95%    Weights Current Weight  11/09/12 194 lb (87.998 kg)  10/07/12 198 lb (89.812 kg)  04/15/12 199 lb 12.8 oz (90.629 kg)    Blood Pressure  BP Readings from Last 3 Encounters:  11/09/12 110/77  10/07/12 110/72  04/15/12 110/88     Admit date:  (Not on file) Last encounter with RMR:  10/07/2012   Allergy Review of patient's allergies indicates no known allergies.  Current Outpatient Prescriptions  Medication Sig Dispense Refill  . amiodarone (PACERONE) 200 MG tablet Take 1 tablet (200 mg total) by mouth daily.  30 tablet  6  . budesonide-formoterol (SYMBICORT) 160-4.5 MCG/ACT inhaler Inhale 2 puffs into the lungs 2 (two) times daily.      . digoxin (LANOXIN) 0.125 MG tablet Take 1 tablet (125 mcg total) by mouth daily.  30 tablet  3  . furosemide (LASIX) 40 MG tablet Take 1 tablet (40 mg total) by mouth 2 (two) times daily.  60 tablet  3  . losartan (COZAAR) 25 MG tablet TAKE ONE TABLET BY MOUTH EVERY DAY  30 tablet  3  . nitroGLYCERIN (NITROSTAT) 0.4 MG SL tablet Place 1 tablet (0.4 mg total) under the tongue every 5 (five) minutes as needed for chest pain.  25 tablet  1  . omeprazole (PRILOSEC) 20 MG capsule Take 20 mg by mouth 2 (two) times daily.        . Rivaroxaban (XARELTO) 20 MG TABS Take 1 tablet (20 mg total) by mouth daily.  30 tablet  3   No current facility-administered medications for this visit.    Discontinued Meds:   There are no discontinued  medications.  Patient Active Problem List  Diagnosis  . CARDIOMYOPATHY, ISCHEMIC  . ICD - IN SITU  . Atrial fibrillation  . CHRONIC SYSTOLIC HEART FAILURE  . ABDOMINAL PAIN OTHER SPECIFIED SITE  . ELECTROCARDIOGRAM, ABNORMAL  . HYPERLIPIDEMIA  . Coronary artery disease  . Cough syncope    LABS    Component Value Date/Time   NA 138 10/07/2012 1546   K 4.2 10/07/2012 1546   CL 102 10/07/2012 1546   CO2 28 10/07/2012 1546   GLUCOSE 91 10/07/2012 1546   BUN 25* 10/07/2012 1546   CREATININE 1.12 10/07/2012 1546   CALCIUM 9.2 10/07/2012 1546   CMP     Component Value Date/Time   NA 138 10/07/2012 1546   K 4.2 10/07/2012 1546   CL 102 10/07/2012 1546   CO2 28 10/07/2012 1546   GLUCOSE 91 10/07/2012 1546   BUN 25* 10/07/2012 1546   CREATININE 1.12 10/07/2012 1546   CALCIUM 9.2 10/07/2012 1546   PROT 6.9 10/07/2012 1546   ALBUMIN 4.1 10/07/2012 1546   AST 26 10/07/2012 1546   ALT 26 10/07/2012 1546   ALKPHOS 71 10/07/2012 1546   BILITOT 0.5 10/07/2012  1546       Component Value Date/Time   WBC 12.1* 10/07/2012 1546   HGB 17.1* 10/07/2012 1546   HCT 49.6 10/07/2012 1546   MCV 93.6 10/07/2012 1546    Lipid Panel  No results found for this basename: chol, trig, hdl, cholhdl, vldl, ldlcalc    ABG No results found for this basename: phart, pco2, pco2art, po2, po2art, hco3, tco2, acidbasedef, o2sat     No results found for this basename: TSH   BNP (last 3 results) No results found for this basename: PROBNP,  in the last 8760 hours Cardiac Panel (last 3 results) No results found for this basename: CKTOTAL, CKMB, TROPONINI, RELINDX,  in the last 72 hours  Iron/TIBC/Ferritin No results found for this basename: iron, tibc, ferritin     EKG Orders placed in visit on 10/07/12  . EKG 12-LEAD     Prior Assessment and Plan Problem List as of 11/09/2012     ICD-9-CM   Coronary artery disease   Last Assessment & Plan   10/07/2012 Office Visit Written 10/07/2012  4:22 PM by Jodelle Gross, NP     He states that he has no frank chest discomfort, but has chronic left upper quadrant pain which he said was also associated with an MI. I do not see where he has had a recent stress test although he was seen and evaluated at [facility] after medical noncompliance issues. He has had multiple PCI's including intervention of the LAD, circumflex, and right coronary artery. We will continue to follow him and consider repeating stress test should he remain symptomatic.    CARDIOMYOPATHY, ISCHEMIC   Last Assessment & Plan   10/07/2012 Office Visit Edited 10/07/2012  4:28 PM by Jodelle Gross, NP     There is no evidence of decompensation of systolic heart failure at this evaluation today. He is medically compliant but continues to have shortness of breath on exertion. I am hearing wheezes inspiratory and expiratory, and therefore will have a chest x-ray completed to evaluate more thoroughly. We will see him again in one month for ongoing evaluation and treatment close followup and encouragement with compliance compliance.   Review of echocardiogram completed during recent hospitalization in February of 20 14. Continue to demonstrated an EF of 25-30% with severely decreased left ventricular systolic function. He had multiple regional wall motion abnormalities. Was also found to have abnormal left ventricular diastolic function. If thrombus was visualized in the left ventricle. It will be important for him to continue Xarelto without fail to avoid CVA. He was noted by Dr. Andee Lineman that he felt the patient may have had a CVA secondary to medical noncompliance causing his acute mental status changes leading to recent hospitalization. Once he is on Xarelto for a minimum of 6 weeks we may need to consider TEE/ DCCV.  at Dr. Vern Claude discretion. Per echo the left atrium was mild to moderately dilated.    ICD - IN SITU   Last Assessment & Plan   03/17/2012 Office Visit Written 03/17/2012  2:14 PM by Prescott Parma, PA     Status post recent inappropriate ICD shock, secondary to AF RVR, converted to NSR. We will arrange patient to be seen by our EP service, here in [place], for further recommendations.    Atrial fibrillation   Last Assessment & Plan   10/07/2012 Office Visit Written 10/07/2012  4:25 PM by Jodelle Gross, NP     During hospitalization he [facility] in February  of 2014 he was found to have A. fib RVR, had not been taking Xarelto or other medications. He was quite confused at that time. I reviewed notes completed by Dr. Andee Lineman during his evaluation of this patient. His heart rate is essentially controlled at this time on digoxin and amiodarone.Coreg was discontinued for unknown reason. I will check a BMET and a CBC for evaluation of anemia and kidney function with medication changes and use of Xarelto..    CHRONIC SYSTOLIC HEART FAILURE   Last Assessment & Plan   04/15/2012 Office Visit Written 04/15/2012  9:07 AM by Hillis Range, MD     Normal ICD function See Pace Art report Zones reprogrammed to single VF zone at 211 bpm with 16 intervals     ABDOMINAL PAIN OTHER SPECIFIED SITE   ELECTROCARDIOGRAM, ABNORMAL   HYPERLIPIDEMIA   Last Assessment & Plan   10/07/2012 Office Visit Written 10/07/2012  4:30 PM by Jodelle Gross, NP     Did not find that he has had followup labs concerning this. He is due to see Dr. Sherryll Burger shortly for ongoing medical needs. It is recommended that he have fasting lipids completed for ongoing risk management.    Cough syncope   Last Assessment & Plan   06/16/2011 Office Visit Written 06/16/2011  9:09 AM by June Leap, MD      We'll discontinue lisinopril and change to losartan 25 mg by mouth daily        Imaging: No results found.

## 2012-11-09 NOTE — Assessment & Plan Note (Signed)
CC pacemaker interrogation note for more details. However it is noted that his heart rate remains greater than 100 beats per minute and he is symptomatic with generalized fatigue and some mild shortness of breath. I will increase his digoxin to 0.25 mg daily. He will followup with Dr. Ladona Ridgel within the next couple of weeks, as he has not been seen by EP since changing his clinic visits from Dr. Margarita Mail office to the [place] office 3 months ago. He should continue Xarelto as directed.

## 2012-11-18 ENCOUNTER — Ambulatory Visit: Payer: Medicare Other | Attending: Adult Health | Admitting: Sleep Medicine

## 2012-11-18 VITALS — Ht 67.0 in | Wt 194.0 lb

## 2012-11-18 DIAGNOSIS — Z6830 Body mass index (BMI) 30.0-30.9, adult: Secondary | ICD-10-CM | POA: Insufficient documentation

## 2012-11-18 DIAGNOSIS — G4710 Hypersomnia, unspecified: Secondary | ICD-10-CM | POA: Insufficient documentation

## 2012-11-18 DIAGNOSIS — R0602 Shortness of breath: Secondary | ICD-10-CM

## 2012-11-23 NOTE — Procedures (Signed)
[facility] Dyan Creelman A. Gerilyn Pilgrim, MD     [email or web address]        NAME:  [name], [name]             ACCOUNT NO.:  [id number]  MEDICAL RECORD NO.:  [id number]          PATIENT TYPE:  OUT  LOCATION:  SLEEP LAB                     FACILITY:  [facility]  PHYSICIAN:  Selinda Korzeniewski A. Gerilyn Pilgrim, M.D. DATE OF BIRTH:  12-Oct-1950  DATE OF STUDY:  11/18/2012                           NOCTURNAL POLYSOMNOGRAM  REFERRING PHYSICIAN:  Bettey Mare. Lawrence, NP  INDICATIONS:  A 62 year old, who presents with hypersomnia, snoring, and difficulty sleeping.  MEDICATIONS:  Amiodarone, Symbicort, digoxin, Lasix, losartan, Xarelto, Prilosec, nitroglycerin, Ultram.  EPWORTH SLEEPINESS SCALE:  11.  BMI:  30.  ARCHITECTURAL SUMMARY:  The total recording time is 407 minutes.  Sleep efficiency 83%.  Sleep latency 23 minutes.  REM latency 49 minutes. Stage N1 14%, N2 69%, N3 0%, and REM sleep 17%.  RESPIRATORY SUMMARY:  Baseline oxygen saturation is 92, lowest saturation 82 during both REM and non-REM sleep.  The patient had prolonged episodes of saturations in the high 80s, about 88% associated without any obstructive events.  He was given 1 L of supplemental oxygen.  AHI is 1 and RDI 4.  LIMB MOVEMENT SUMMARY:  PLM index 6.  ELECTROCARDIOGRAM SUMMARY:  Average heart rate is 83.  The patient had quite frequent PVCs seen on every recording.  Additionally, there is several runs of ventricular tachycardia and intermittent atrial fibrillation.  IMPRESSION: 1. Hypoventilation syndrome. 2. Mild periodic limb movement disorder. 3. Abnormal sleep architecture with early REM latency, and likely due     to REM rebound phenomenon and less likely narcolepsy. 4. Frequent PVCs and runs of ventricular tachycardia and atrial     fibrillation.  RECOMMENDATION:  2 L nasal oxygen to treat the hypoventilation syndrome.  Thanks for this referral.    Rayshad Riviello A. Gerilyn Pilgrim, M.D.    KAD/MEDQ  D:  11/20/2012 19:47:39   T:  11/20/2012 20:05:33  Job:  [id number]

## 2012-11-26 ENCOUNTER — Telehealth: Payer: Self-pay | Admitting: *Deleted

## 2012-11-26 NOTE — Telephone Encounter (Signed)
Received the following notation from [facility] NP:  Please order O2 at 2/liter for this patient for HS use. Diagnosis, hypoventilation syndrome with abnormal sleep study. ----- Message ----- From: Beryle Beams, MD Sent: 11/24/2012 4:31 PM To: Jodelle Gross, NP

## 2012-11-26 NOTE — Telephone Encounter (Signed)
Called [facility] and was advised per pt insurance they can not bill for the O2, advised to contact [facility], was transferred to [facility] office to set up for pt per pt is not currently a pt of [facility], spoke to rep

## 2012-11-26 NOTE — Telephone Encounter (Signed)
Note order will have to pend until Monday per [facility] rep advised the order and sleep study will need to be faxed to [phone] and they will contact the office, manager YL made aware, will advise [facility] NP on Monday to sign order in pt chart

## 2012-11-30 NOTE — Telephone Encounter (Signed)
Diara from [facility] office with [facility] advised they did receive pt report and RX for O2 however the Or saturations on the report are not enough for the pt to have the O2 ordered advised Daira per [facility] and YL to advise [facility] to contact Dr Gerilyn Pilgrim office at [phone] or via fax [phone], diara advised she will contact Dr Gerilyn Pilgrim office concerning the matter for this pt

## 2012-12-07 ENCOUNTER — Encounter: Payer: Self-pay | Admitting: *Deleted

## 2012-12-07 ENCOUNTER — Ambulatory Visit (INDEPENDENT_AMBULATORY_CARE_PROVIDER_SITE_OTHER): Payer: Medicare Other | Admitting: Internal Medicine

## 2012-12-07 ENCOUNTER — Encounter: Payer: Self-pay | Admitting: Internal Medicine

## 2012-12-07 VITALS — BP 97/72 | HR 114 | Ht 67.0 in | Wt 193.0 lb

## 2012-12-07 DIAGNOSIS — Z95810 Presence of automatic (implantable) cardiac defibrillator: Secondary | ICD-10-CM

## 2012-12-07 DIAGNOSIS — I2589 Other forms of chronic ischemic heart disease: Secondary | ICD-10-CM

## 2012-12-07 DIAGNOSIS — I4891 Unspecified atrial fibrillation: Secondary | ICD-10-CM

## 2012-12-07 DIAGNOSIS — I5022 Chronic systolic (congestive) heart failure: Secondary | ICD-10-CM

## 2012-12-07 LAB — ICD DEVICE OBSERVATION
AL IMPEDENCE ICD: 410 Ohm
BAMS-0003: 70 {beats}/min
BATTERY VOLTAGE: 2.59 V
DEV-0020ICD: NEGATIVE
RV LEAD IMPEDENCE ICD: 300 Ohm

## 2012-12-07 NOTE — Patient Instructions (Addendum)
Your physician recommends that you schedule a follow-up appointment in: 3 MONTHS WITH [facility]  Your physician has requested that you have a Cardioversion.A Cardioversion uses a jolt of electricity to your heart either through paddles or wired patches attached to your chest. This is a controlled, usually prescheduled, procedure. This procedure is done at the hospital and you are not awake during the procedure. You usually go home the day of the procedure. Please see the instruction sheet given to you today for more information.  Your physician recommends that you return for lab work in: today (slips given) bmet, cbc

## 2012-12-08 ENCOUNTER — Other Ambulatory Visit: Payer: Self-pay | Admitting: *Deleted

## 2012-12-08 DIAGNOSIS — I10 Essential (primary) hypertension: Secondary | ICD-10-CM

## 2012-12-08 LAB — BASIC METABOLIC PANEL
CO2: 32 mEq/L (ref 19–32)
Chloride: 101 mEq/L (ref 96–112)
Creat: 1.04 mg/dL (ref 0.50–1.35)
Glucose, Bld: 87 mg/dL (ref 70–99)
Sodium: 142 mEq/L (ref 135–145)

## 2012-12-09 ENCOUNTER — Encounter: Payer: Self-pay | Admitting: Internal Medicine

## 2012-12-09 ENCOUNTER — Encounter (HOSPITAL_COMMUNITY): Payer: Self-pay | Admitting: Pharmacy Technician

## 2012-12-09 LAB — CBC
HCT: 48.5 % (ref 39.0–52.0)
Hemoglobin: 16.3 g/dL (ref 13.0–17.0)
MCV: 91.5 fL (ref 78.0–100.0)
RBC: 5.3 MIL/uL (ref 4.22–5.81)
WBC: 9.9 10*3/uL (ref 4.0–10.5)

## 2012-12-09 NOTE — Progress Notes (Signed)
HPI [name] returns today for followup. He is a 62 year old man with atrial fibrillation, COPD, chronic systolic heart failure, and an ischemic cardiomyopathy. He is status post ICD implantation. The patient has had an elevated heart rate and despite amiodarone, has reverted back to atrial fibrillation. He denies any ICD shock. He has class II to class III heart failure symptoms.  Current Outpatient Prescriptions  Medication Sig Dispense Refill  . amiodarone (PACERONE) 200 MG tablet Take 1 tablet (200 mg total) by mouth daily.  30 tablet  6  . budesonide-formoterol (SYMBICORT) 160-4.5 MCG/ACT inhaler Inhale 2 puffs into the lungs 2 (two) times daily.      . digoxin (LANOXIN) 0.25 MG tablet Take 1 tablet (250 mcg total) by mouth daily.  30 tablet  3  . furosemide (LASIX) 40 MG tablet Take 1 tablet (40 mg total) by mouth 2 (two) times daily.  60 tablet  3  . nitroGLYCERIN (NITROSTAT) 0.4 MG SL tablet Place 1 tablet (0.4 mg total) under the tongue every 5 (five) minutes as needed for chest pain.  25 tablet  1  . omeprazole (PRILOSEC) 20 MG capsule Take 20 mg by mouth 2 (two) times daily.        . Rivaroxaban (XARELTO) 20 MG TABS Take 1 tablet (20 mg total) by mouth daily.  30 tablet  3  . traMADol (ULTRAM) 50 MG tablet Take 1 tablet (50 mg total) by mouth every 6 (six) hours as needed for pain.  30 tablet  0  . losartan (COZAAR) 50 MG tablet Take 50 mg by mouth daily.       No current facility-administered medications for this visit.     Past Medical History  Diagnosis Date  . Ischemic cardiomyopathy     cardiomyopathy ischemic  . Paroxysmal atrial fibrillation   . Renal calculi   . 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 Chronic systolic dysfunction of left ventricle   . Coronary artery disease   . Chronic pain     chronic left abdominal pain    ROS:   All systems reviewed and negative except as noted in the HPI.   Past Surgical History  Procedure  Laterality Date  . Difibrillator insertion  12/31/06    SJM Current DR RF ICD implanted by Dr Graciela Husbands     Family History  Problem Relation Age of Onset  . Coronary artery disease Other     family history of CAD     History   Social History  . Marital Status: Married    Spouse Name: N/A    Number of Children: N/A  . Years of Education: N/A   Occupational History  . RETIRED    Social History Main Topics  . Smoking status: Current Some Day Smoker -- 0.50 packs/day for 40 years    Types: Cigarettes  . Smokeless tobacco: Never Used     Comment: he is not willing to quit  . Alcohol Use: No  . Drug Use: No  . Sexually Active: Not on file   Other Topics Concern  . Not on file   Social History Narrative   DISABLED ,DIVORCED     BP 97/72  Pulse 114  Ht 5\' 7"  (1.702 m)  Wt 193 lb (87.544 kg)  BMI 30.22 kg/m2  Physical Exam:  Well appearing 63 year old man,NAD HEENT: Unremarkable Neck:  7 cm JVD, no thyromegally Back:  No CVA tenderness Lungs:  Clear with no wheezes,  rales, or rhonchi. HEART:  IRegular tachycardia rhythm, no murmurs, no rubs, no clicks Abd:  soft, positive bowel sounds, no organomegally, no rebound, no guarding Ext:  2 plus pulses, no edema, no cyanosis, no clubbing Skin:  No rashes no nodules Neuro:  CN II through XII intact, motor grossly intact  EKG Atrial fibrillation with a rapid ventricular response DEVICE  Normal device function.  See PaceArt for details.   Assess/Plan:

## 2012-12-09 NOTE — Assessment & Plan Note (Signed)
His heart failure symptoms are class III. Hopefully they will improve with return of sinus rhythm.

## 2012-12-09 NOTE — Assessment & Plan Note (Signed)
His [employer] Jude dual-chamber ICD is working normally. We'll plan to recheck in several months. 

## 2012-12-09 NOTE — Assessment & Plan Note (Signed)
Despite his elevated ventricular rate, the patient denies anginal symptoms. He will continue his current medical therapy.

## 2012-12-09 NOTE — Assessment & Plan Note (Signed)
The patient remains in atrial fibrillation with an elevated heart rate. I've discussed the treatment options with the patient, and have recommended proceeding with cardioversion. This will be scheduled next several days. He has been systemically anticoagulated with Xarelto. He denies missing any doses.

## 2012-12-10 ENCOUNTER — Encounter (HOSPITAL_COMMUNITY)
Admission: RE | Disposition: A | Discharge status: Home / Self Care | Payer: Self-pay | Source: Ambulatory Visit | Attending: Internal Medicine

## 2012-12-10 ENCOUNTER — Ambulatory Visit (HOSPITAL_COMMUNITY)
Admission: RE | Admit: 2012-12-10 | Discharge: 2012-12-10 | Disposition: A | Discharge status: Home / Self Care | Payer: Medicare Other | Source: Ambulatory Visit | Attending: Internal Medicine | Admitting: Internal Medicine

## 2012-12-10 DIAGNOSIS — Z79899 Other long term (current) drug therapy: Secondary | ICD-10-CM | POA: Insufficient documentation

## 2012-12-10 DIAGNOSIS — J4489 Other specified chronic obstructive pulmonary disease: Secondary | ICD-10-CM | POA: Insufficient documentation

## 2012-12-10 DIAGNOSIS — I2589 Other forms of chronic ischemic heart disease: Secondary | ICD-10-CM | POA: Insufficient documentation

## 2012-12-10 DIAGNOSIS — Z8249 Family history of ischemic heart disease and other diseases of the circulatory system: Secondary | ICD-10-CM | POA: Insufficient documentation

## 2012-12-10 DIAGNOSIS — F17200 Nicotine dependence, unspecified, uncomplicated: Secondary | ICD-10-CM | POA: Insufficient documentation

## 2012-12-10 DIAGNOSIS — I4891 Unspecified atrial fibrillation: Secondary | ICD-10-CM | POA: Insufficient documentation

## 2012-12-10 DIAGNOSIS — J449 Chronic obstructive pulmonary disease, unspecified: Secondary | ICD-10-CM | POA: Insufficient documentation

## 2012-12-10 DIAGNOSIS — I509 Heart failure, unspecified: Secondary | ICD-10-CM | POA: Insufficient documentation

## 2012-12-10 DIAGNOSIS — I5022 Chronic systolic (congestive) heart failure: Secondary | ICD-10-CM | POA: Insufficient documentation

## 2012-12-10 DIAGNOSIS — Z95810 Presence of automatic (implantable) cardiac defibrillator: Secondary | ICD-10-CM | POA: Insufficient documentation

## 2012-12-10 DIAGNOSIS — R0602 Shortness of breath: Secondary | ICD-10-CM | POA: Insufficient documentation

## 2012-12-10 DIAGNOSIS — Z7901 Long term (current) use of anticoagulants: Secondary | ICD-10-CM | POA: Insufficient documentation

## 2012-12-10 HISTORY — PX: CARDIOVERSION: SHX1299

## 2012-12-10 SURGERY — CARDIOVERSION
Anesthesia: LOCAL | Wound class: Clean

## 2012-12-10 MED ORDER — SODIUM CHLORIDE 0.45 % IV SOLN: INTRAVENOUS | Status: DC

## 2012-12-10 MED ORDER — FENTANYL CITRATE 0.05 MG/ML IJ SOLN
INTRAMUSCULAR | Status: AC
  Filled 2012-12-10: qty 2

## 2012-12-10 MED ORDER — MIDAZOLAM HCL 2 MG/2ML IJ SOLN
INTRAMUSCULAR | Status: AC
  Filled 2012-12-10: qty 4

## 2012-12-10 MED ORDER — FUROSEMIDE 10 MG/ML IJ SOLN
INTRAMUSCULAR | Status: AC
  Filled 2012-12-10: qty 4

## 2012-12-10 MED ORDER — ACETAMINOPHEN 325 MG PO TABS
650.0000 mg | ORAL_TABLET | ORAL | Status: DC | PRN
  Administered 2012-12-10: 650 mg via ORAL

## 2012-12-10 MED ORDER — METOPROLOL TARTRATE 1 MG/ML IV SOLN
INTRAVENOUS | Status: AC
  Filled 2012-12-10: qty 5

## 2012-12-10 MED ORDER — ALBUTEROL SULFATE (5 MG/ML) 0.5% IN NEBU
2.5000 mg | INHALATION_SOLUTION | Freq: Once | RESPIRATORY_TRACT | Status: AC
  Administered 2012-12-10: 2.5 mg via RESPIRATORY_TRACT

## 2012-12-10 MED ORDER — ALBUTEROL SULFATE (5 MG/ML) 0.5% IN NEBU
INHALATION_SOLUTION | RESPIRATORY_TRACT | Status: AC
  Filled 2012-12-10: qty 0.5

## 2012-12-10 MED ORDER — ACETAMINOPHEN 325 MG PO TABS
ORAL_TABLET | ORAL | Status: AC
  Filled 2012-12-10: qty 2

## 2012-12-10 MED ORDER — MIDAZOLAM HCL 2 MG/2ML IJ SOLN
INTRAMUSCULAR | Status: AC
  Filled 2012-12-10: qty 2

## 2012-12-10 MED ORDER — FUROSEMIDE 10 MG/ML IJ SOLN
40.0000 mg | Freq: Once | INTRAMUSCULAR | Status: AC
  Administered 2012-12-10: 40 mg via INTRAVENOUS

## 2012-12-10 NOTE — Op Note (Signed)
EP Procedure Note  Procedure: DCCV Indication: symptomatic atrial fibrillation Complications: none immediately Findings: after informed consent was obtained, the patient was prepped in the usual fashion. He was sedated with 4 mg IV versed and 37.5 mcg of IV fentanyl. He was cardioverted with 200 J synch biphasic energy, restoring NSR. The patient was given 5 mg of IV lopressor after the procedure over 5 minutes to reduce the risk of ERAF.  [name].D.

## 2012-12-10 NOTE — H&P (View-Only) (Signed)
HPI [name] returns today for followup. He is a 62-year-old man with atrial fibrillation, COPD, chronic systolic heart failure, and an ischemic cardiomyopathy. He is status post ICD implantation. The patient has had an elevated heart rate and despite amiodarone, has reverted back to atrial fibrillation. He denies any ICD shock. He has class II to class III heart failure symptoms.  Current Outpatient Prescriptions  Medication Sig Dispense Refill  . amiodarone (PACERONE) 200 MG tablet Take 1 tablet (200 mg total) by mouth daily.  30 tablet  6  . budesonide-formoterol (SYMBICORT) 160-4.5 MCG/ACT inhaler Inhale 2 puffs into the lungs 2 (two) times daily.      . digoxin (LANOXIN) 0.25 MG tablet Take 1 tablet (250 mcg total) by mouth daily.  30 tablet  3  . furosemide (LASIX) 40 MG tablet Take 1 tablet (40 mg total) by mouth 2 (two) times daily.  60 tablet  3  . nitroGLYCERIN (NITROSTAT) 0.4 MG SL tablet Place 1 tablet (0.4 mg total) under the tongue every 5 (five) minutes as needed for chest pain.  25 tablet  1  . omeprazole (PRILOSEC) 20 MG capsule Take 20 mg by mouth 2 (two) times daily.        . Rivaroxaban (XARELTO) 20 MG TABS Take 1 tablet (20 mg total) by mouth daily.  30 tablet  3  . traMADol (ULTRAM) 50 MG tablet Take 1 tablet (50 mg total) by mouth every 6 (six) hours as needed for pain.  30 tablet  0  . losartan (COZAAR) 50 MG tablet Take 50 mg by mouth daily.       No current facility-administered medications for this visit.     Past Medical History  Diagnosis Date  . Ischemic cardiomyopathy     cardiomyopathy ischemic  . Paroxysmal atrial fibrillation   . Renal calculi   . 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 Chronic systolic dysfunction of left ventricle   . Coronary artery disease   . Chronic pain     chronic left abdominal pain    ROS:   All systems reviewed and negative except as noted in the HPI.   Past Surgical History  Procedure  Laterality Date  . Difibrillator insertion  12/31/06    SJM Current DR RF ICD implanted by Dr Klein     Family History  Problem Relation Age of Onset  . Coronary artery disease Other     family history of CAD     History   Social History  . Marital Status: Married    Spouse Name: N/A    Number of Children: N/A  . Years of Education: N/A   Occupational History  . RETIRED    Social History Main Topics  . Smoking status: Current Some Day Smoker -- 0.50 packs/day for 40 years    Types: Cigarettes  . Smokeless tobacco: Never Used     Comment: he is not willing to quit  . Alcohol Use: No  . Drug Use: No  . Sexually Active: Not on file   Other Topics Concern  . Not on file   Social History Narrative   DISABLED ,DIVORCED     BP 97/72  Pulse 114  Ht 5' 7" (1.702 m)  Wt 193 lb (87.544 kg)  BMI 30.22 kg/m2  Physical Exam:  Well appearing 62-year-old man,NAD HEENT: Unremarkable Neck:  7 cm JVD, no thyromegally Back:  No CVA tenderness Lungs:  Clear with no wheezes,   rales, or rhonchi. HEART:  IRegular tachycardia rhythm, no murmurs, no rubs, no clicks Abd:  soft, positive bowel sounds, no organomegally, no rebound, no guarding Ext:  2 plus pulses, no edema, no cyanosis, no clubbing Skin:  No rashes no nodules Neuro:  CN II through XII intact, motor grossly intact  EKG Atrial fibrillation with a rapid ventricular response DEVICE  Normal device function.  See PaceArt for details.   Assess/Plan: 

## 2012-12-10 NOTE — Progress Notes (Signed)
Called to holding area to administer neb treatment due to SOB. Pt has very diminished bs on R side, otherwise mostly clear. Pt tolerating neb at this time. RT will continue to monitor.

## 2012-12-10 NOTE — Interval H&P Note (Signed)
History and Physical Interval Note:  12/10/2012 10:05 AM  [name]  has presented today for surgery, with the diagnosis of Afib  The various methods of treatment have been discussed with the patient and family. After consideration of risks, benefits and other options for treatment, the patient has consented to  Procedure(s): CARDIOVERSION (N/A) as a surgical intervention .  The patient's history has been reviewed, patient examined, no change in status, stable for surgery.  I have reviewed the patient's chart and labs.  Questions were answered to the patient's satisfaction.     Leonia Reeves.D.

## 2012-12-12 ENCOUNTER — Telehealth: Payer: Self-pay | Admitting: Physician Assistant

## 2012-12-12 NOTE — Telephone Encounter (Signed)
Pt called just now Sunday 5/18 with question about his Xarelto. He chronically takes 20mg  daily. However, he picked up his rx yesterday as refilled by Dr. Margaretmary Eddy office and the instructions on the bottle say to take twice a day. He spelled out the name and dose for me to verify. He states he was never informed that he should increase dose. He denies being diagnosed with PE or DVT in the meantime, so a BID dosing does not make sense. I told him from cardiac standpoint, he should remain taking 20mg  daily - which he agrees with and understands. However, I told him he needs to contact Dr. Margaretmary Eddy office next to clarify why this medication was changed to twice a day. He verbalized understanding and gratitude. Pocahontas Cohenour PA-C

## 2012-12-14 ENCOUNTER — Encounter (INDEPENDENT_AMBULATORY_CARE_PROVIDER_SITE_OTHER): Payer: Self-pay | Admitting: *Deleted

## 2012-12-27 ENCOUNTER — Encounter: Payer: Self-pay | Admitting: Internal Medicine

## 2012-12-28 ENCOUNTER — Encounter: Payer: Self-pay | Admitting: Internal Medicine

## 2012-12-29 ENCOUNTER — Encounter: Payer: Self-pay | Admitting: Internal Medicine

## 2013-02-07 ENCOUNTER — Telehealth: Payer: Self-pay | Admitting: *Deleted

## 2013-02-07 MED ORDER — LOSARTAN POTASSIUM 50 MG PO TABS: 50.0000 mg | ORAL_TABLET | Freq: Every day | ORAL | Status: DC

## 2013-02-07 NOTE — Telephone Encounter (Signed)
rx sent to pharmacy by e-script  

## 2013-02-07 NOTE — Telephone Encounter (Signed)
Pt is completely out of losartin- needs called in to [facility] in [place].

## 2013-02-14 ENCOUNTER — Other Ambulatory Visit: Payer: Self-pay | Admitting: *Deleted

## 2013-02-14 MED ORDER — LOSARTAN POTASSIUM 50 MG PO TABS: 50.0000 mg | ORAL_TABLET | Freq: Every day | ORAL | Status: DC

## 2013-02-16 ENCOUNTER — Telehealth: Payer: Self-pay | Admitting: Nurse Practitioner

## 2013-02-16 ENCOUNTER — Other Ambulatory Visit: Payer: Self-pay | Admitting: Nurse Practitioner

## 2013-02-16 MED ORDER — RIVAROXABAN 20 MG PO TABS: 20.0000 mg | ORAL_TABLET | Freq: Every day | ORAL | Status: DC

## 2013-02-16 NOTE — Telephone Encounter (Signed)
Pt called stating that he will run out of Xarelto after this evening's dose.  He is calling to request a f/u appt as he was told that he could not receive refills on Xarelto until he arranged f/u.  I have sent in a refill into [facility] in [place] for Xarelto 20mg  daily, #30, 3 refills.  I will forward this note to our [facility] team so that f/u may be arranged.

## 2013-03-02 ENCOUNTER — Encounter: Payer: Medicare Other | Admitting: Adult Health

## 2013-03-02 NOTE — Progress Notes (Signed)
   HPI: [name] is a 62 year old patient of Dr. Daleen Squibb, formally of Dr. Andee Lineman, we are following for ongoing assessment and management of chronic atrial fibrillation, ischemic heart myopathy, with history of St. Jude ICD. He was last seen in the office on 11/09/2012 complaints of general fatigue, dyspnea on exertion, and insomnia. The patient was also complaining of pain in his pacemaker site in the right upper chest stating it to seat belt causes him to have a lot of pain as it crosses over that site. The patient had had his pacemaker interrogated with heart rate running 100 -110 beats per minute range. On that visit I increased his digoxin to 0.25 mg from 0.125 mg. Given a prescription for tramadol to take every 6 hours when necessary for pacemaker site discomfort. On 12/10/2012 the patient underwent cardioversion from atrial fibrillation to normal sinus rhythm by Dr. Sharrell Ku. He remains on Xarelto 20 mg daily.   No Known Allergies  Current Outpatient Prescriptions  Medication Sig Dispense Refill  . amiodarone (PACERONE) 200 MG tablet Take 1 tablet (200 mg total) by mouth daily.  30 tablet  6  . budesonide-formoterol (SYMBICORT) 160-4.5 MCG/ACT inhaler Inhale 2 puffs into the lungs 2 (two) times daily.      . digoxin (LANOXIN) 0.25 MG tablet Take 1 tablet (250 mcg total) by mouth daily.  30 tablet  3  . furosemide (LASIX) 40 MG tablet Take 1 tablet (40 mg total) by mouth 2 (two) times daily.  60 tablet  3  . losartan (COZAAR) 50 MG tablet Take 1 tablet (50 mg total) by mouth daily.  90 tablet  0  . nitroGLYCERIN (NITROSTAT) 0.4 MG SL tablet Place 1 tablet (0.4 mg total) under the tongue every 5 (five) minutes as needed for chest pain.  25 tablet  1  . omeprazole (PRILOSEC) 20 MG capsule Take 20 mg by mouth 2 (two) times daily.        . Rivaroxaban (XARELTO) 20 MG TABS Take 1 tablet (20 mg total) by mouth daily.  30 tablet  3  . traMADol (ULTRAM) 50 MG tablet Take 1 tablet (50 mg total) by  mouth every 6 (six) hours as needed for pain.  30 tablet  0   No current facility-administered medications for this visit.    Past Medical History  Diagnosis Date  . Ischemic cardiomyopathy     cardiomyopathy ischemic  . Paroxysmal atrial fibrillation   . Renal calculi   . 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 Chronic systolic dysfunction of left ventricle   . Coronary artery disease   . Chronic pain     chronic left abdominal pain    Past Surgical History  Procedure Laterality Date  . Difibrillator insertion  12/31/06    SJM Current DR RF ICD implanted by Dr Graciela Husbands    ROS: PHYSICAL EXAM There were no vitals taken for this visit.  EKG:  ASSESSMENT AND PLAN

## 2013-03-07 ENCOUNTER — Other Ambulatory Visit: Payer: Self-pay | Admitting: Internal Medicine

## 2013-03-08 ENCOUNTER — Other Ambulatory Visit: Payer: Self-pay | Admitting: Cardiology

## 2013-03-08 MED ORDER — DIGOXIN 250 MCG PO TABS: 250.0000 ug | ORAL_TABLET | Freq: Every day | ORAL | Status: DC

## 2013-03-11 ENCOUNTER — Ambulatory Visit: Payer: Medicare Other | Admitting: Adult Health

## 2013-03-18 ENCOUNTER — Ambulatory Visit: Payer: Medicare Other | Admitting: Adult Health

## 2013-03-21 ENCOUNTER — Ambulatory Visit (INDEPENDENT_AMBULATORY_CARE_PROVIDER_SITE_OTHER): Payer: Medicare Other | Admitting: Internal Medicine

## 2013-03-21 ENCOUNTER — Encounter: Payer: Self-pay | Admitting: *Deleted

## 2013-03-21 ENCOUNTER — Encounter: Payer: Self-pay | Admitting: Internal Medicine

## 2013-03-21 VITALS — BP 116/77 | HR 74 | Ht 66.0 in | Wt 192.0 lb

## 2013-03-21 DIAGNOSIS — I2510 Atherosclerotic heart disease of native coronary artery without angina pectoris: Secondary | ICD-10-CM

## 2013-03-21 DIAGNOSIS — I2589 Other forms of chronic ischemic heart disease: Secondary | ICD-10-CM

## 2013-03-21 DIAGNOSIS — R059 Cough, unspecified: Secondary | ICD-10-CM

## 2013-03-21 DIAGNOSIS — R05 Cough: Secondary | ICD-10-CM

## 2013-03-21 DIAGNOSIS — Z95810 Presence of automatic (implantable) cardiac defibrillator: Secondary | ICD-10-CM

## 2013-03-21 DIAGNOSIS — I4891 Unspecified atrial fibrillation: Secondary | ICD-10-CM

## 2013-03-21 DIAGNOSIS — I5022 Chronic systolic (congestive) heart failure: Secondary | ICD-10-CM

## 2013-03-21 LAB — ICD DEVICE OBSERVATION
AL AMPLITUDE: 3 mv
AL IMPEDENCE ICD: 400 Ohm
BAMS-0001: 180 {beats}/min
BAMS-0003: 70 {beats}/min
BATTERY VOLTAGE: 2.5869 V
DEVICE MODEL ICD: 454266
FVT: 0
MODE SWITCH EPISODES: 29
PACEART VT: 0
RV LEAD AMPLITUDE: 11.5 mv
TOT-0007: 3
TOT-0010: 40
VF: 0

## 2013-03-21 MED ORDER — AMIODARONE HCL 200 MG PO TABS: 200.0000 mg | ORAL_TABLET | Freq: Two times a day (BID) | ORAL | Status: DC

## 2013-03-21 MED ORDER — DIGOXIN 250 MCG PO TABS: 250.0000 ug | ORAL_TABLET | Freq: Every day | ORAL | Status: DC

## 2013-03-21 MED ORDER — FUROSEMIDE 40 MG PO TABS: 40.0000 mg | ORAL_TABLET | Freq: Two times a day (BID) | ORAL | Status: DC

## 2013-03-21 MED ORDER — LOSARTAN POTASSIUM 50 MG PO TABS: 50.0000 mg | ORAL_TABLET | Freq: Every day | ORAL | Status: DC

## 2013-03-21 MED ORDER — RIVAROXABAN 20 MG PO TABS: 20.0000 mg | ORAL_TABLET | Freq: Every day | ORAL | Status: DC

## 2013-03-21 MED ORDER — NITROGLYCERIN 0.4 MG SL SUBL: 0.4000 mg | SUBLINGUAL_TABLET | SUBLINGUAL | Status: DC | PRN

## 2013-03-21 NOTE — Patient Instructions (Addendum)
Your physician recommends that you schedule a follow-up appointment in: 3 months with Dr Ladona Ridgel and 3 Months with Gunnar Fusi  Your physician has recommended you make the following change in your medication:  1. Discontinue Digoxin 2. Start Pacerone 200 mg BID    Your physician has recommended that you have a Cardioversion (DCCV). Electrical Cardioversion uses a jolt of electricity to your heart either through paddles or wired patches attached to your chest. This is a controlled, usually prescheduled, procedure. Defibrillation is done under light anesthesia in the hospital, and you usually go home the day of the procedure. This is done to get your heart back into a normal rhythm. You are not awake for the procedure. Please see the instruction sheet given to you today.

## 2013-03-22 ENCOUNTER — Encounter: Payer: Self-pay | Admitting: Internal Medicine

## 2013-03-22 NOTE — Assessment & Plan Note (Signed)
His chronic systolic heart failure is class II to class III. He states that he typically feels better with more energy when he is in sinus rhythm. He will continue his current medications, but I've asked him to up titrate his amiodarone to 4 mg daily.

## 2013-03-22 NOTE — Assessment & Plan Note (Signed)
His [employer]. Jude dual-chamber ICD is working normally. No recent ICD therapies.

## 2013-03-22 NOTE — Progress Notes (Signed)
HPI [name] returns today for followup. He is a very pleasant 62 year old man with persistent atrial fibrillation. He was initially placed on amiodarone, and underwent cardioversion restoring sinus rhythm. He subsequently reverted back to atrial fibrillation. He notes that when he is in atrial fibrillation, he has less energy and feels more tired.  He denies chest pain or shortness of breath except with exertion. The patient has a history of chronic systolic heart failure, with an ischemic cardiomyopathy. He has had no recent ICD shocks. Interrogation of his ICD demonstrates that he has been in atrial fibrillation for approximately 4 months. No Known Allergies   Current Outpatient Prescriptions  Medication Sig Dispense Refill  . amiodarone (PACERONE) 200 MG tablet Take 1 tablet (200 mg total) by mouth 2 (two) times daily.  30 tablet  6  . budesonide-formoterol (SYMBICORT) 160-4.5 MCG/ACT inhaler Inhale 2 puffs into the lungs 2 (two) times daily.      . furosemide (LASIX) 40 MG tablet Take 1 tablet (40 mg total) by mouth 2 (two) times daily.  60 tablet  3  . losartan (COZAAR) 50 MG tablet Take 1 tablet (50 mg total) by mouth daily.  90 tablet  0  . nitroGLYCERIN (NITROSTAT) 0.4 MG SL tablet Place 1 tablet (0.4 mg total) under the tongue every 5 (five) minutes as needed for chest pain.  25 tablet  1  . omeprazole (PRILOSEC) 20 MG capsule Take 20 mg by mouth 2 (two) times daily.        . Rivaroxaban (XARELTO) 20 MG TABS tablet Take 1 tablet (20 mg total) by mouth daily.  30 tablet  3  . traMADol (ULTRAM) 50 MG tablet Take 1 tablet (50 mg total) by mouth every 6 (six) hours as needed for pain.  30 tablet  0   No current facility-administered medications for this visit.     Past Medical History  Diagnosis Date  . Ischemic cardiomyopathy     cardiomyopathy ischemic  . Paroxysmal atrial fibrillation   . Renal calculi   . 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  Chronic systolic dysfunction of left ventricle   . Coronary artery disease   . Chronic pain     chronic left abdominal pain    ROS:   All systems reviewed and negative except as noted in the HPI.   Past Surgical History  Procedure Laterality Date  . Difibrillator insertion  12/31/06    SJM Current DR RF ICD implanted by Dr Graciela Husbands     Family History  Problem Relation Age of Onset  . Coronary artery disease Other     family history of CAD     History   Social History  . Marital Status: Married    Spouse Name: N/A    Number of Children: N/A  . Years of Education: N/A   Occupational History  . RETIRED    Social History Main Topics  . Smoking status: Current Some Day Smoker -- 0.50 packs/day for 40 years    Types: Cigarettes  . Smokeless tobacco: Never Used     Comment: he is not willing to quit  . Alcohol Use: No  . Drug Use: No  . Sexual Activity: Not on file   Other Topics Concern  . Not on file   Social History Narrative   DISABLED ,DIVORCED     BP 116/77  Pulse 74  Ht 5\' 6"  (1.676 m)  Wt 192 lb (87.091 kg)  BMI 31 kg/m2  Physical Exam:  stable appearing middle-aged man, NAD HEENT: Unremarkable Neck:  6 cm JVD, no thyromegally Back:  No CVA tenderness Lungs:  Clear with no wheezes, rales, or rhonchi. HEART:  Regular rate rhythm, no murmurs, no rubs, no clicks Abd:  soft, positive bowel sounds, no organomegally, no rebound, no guarding Ext:  2 plus pulses, no edema, no cyanosis, no clubbing Skin:  No rashes no nodules Neuro:  CN II through XII intact, motor grossly intact  EKG - atrial fibrillation with a controlled ventricular response  DEVICE  Normal device function.  See PaceArt for details.   Assess/Plan:

## 2013-03-22 NOTE — Assessment & Plan Note (Signed)
We discussed the various treatment options in detail. That he is maintained atrial fibrillation on 200 mg of amiodarone  Suggest that we will need to do another cardioversion and up titration of his amiodarone if we're to keep him in sinus rhythm. He might be a candidate for atrial fibrillation ablation if he can be maintained sinus rhythm for several months on the higher dose of amiodarone. To this end, we will plan cardioversion in 4-6 weeks. He will continue his anticoagulation on 400 mg daily of amiodarone.

## 2013-04-01 ENCOUNTER — Encounter: Payer: Self-pay | Admitting: Adult Health

## 2013-04-01 ENCOUNTER — Ambulatory Visit (INDEPENDENT_AMBULATORY_CARE_PROVIDER_SITE_OTHER): Payer: Medicare Other | Admitting: Adult Health

## 2013-04-01 ENCOUNTER — Ambulatory Visit: Payer: Medicare Other | Admitting: Adult Health

## 2013-04-01 VITALS — Ht 66.0 in | Wt 196.2 lb

## 2013-04-01 DIAGNOSIS — I4891 Unspecified atrial fibrillation: Secondary | ICD-10-CM

## 2013-04-01 DIAGNOSIS — I2589 Other forms of chronic ischemic heart disease: Secondary | ICD-10-CM

## 2013-04-01 MED ORDER — DIGOXIN 125 MCG PO TABS: 0.1250 mg | ORAL_TABLET | Freq: Every day | ORAL | Status: DC

## 2013-04-01 NOTE — Progress Notes (Signed)
HPI: [name] is a 62 year old patient of Dr. Sharrell Ku we are following for ongoing assessment and management of atrial fibrillation, recently placed on amiodarone and underwent cardioversion restoring him to normal sinus rhythm. He subsequently reverted back to atrial fibrillation. The patient was last seen by Dr. Ladona Ridgel on 03/22/2013 14 relation of his ICD. He has chronic systolic heart failure class 2-3. Amiodarone was increased to 400 mg daily. He was continued on anticoagulation. He is here to evaluate his symptoms and INR.With planned cardioversion in 4-6 weeks if he remains in atrial fib and therapeutic.   [name] comes today with complaints of family issues with an adolescent son and has been causing him to have a lot of stress. He does of this the patient states he does not wish to proceed with a defibrillation or RFA ablation at this time. He wants to continue on medical therapy for now. The patient is quite emotional today as he discusses his issues with his son. Heart rate is elevated. He is medically compliant taking 200 mg twice a day of amiodarone as directed. No Known Allergies  Current Outpatient Prescriptions  Medication Sig Dispense Refill  . amiodarone (PACERONE) 200 MG tablet Take 1 tablet (200 mg total) by mouth 2 (two) times daily.  30 tablet  6  . budesonide-formoterol (SYMBICORT) 160-4.5 MCG/ACT inhaler Inhale 2 puffs into the lungs 2 (two) times daily.      . furosemide (LASIX) 40 MG tablet Take 1 tablet (40 mg total) by mouth 2 (two) times daily.  60 tablet  3  . losartan (COZAAR) 50 MG tablet Take 1 tablet (50 mg total) by mouth daily.  90 tablet  0  . nitroGLYCERIN (NITROSTAT) 0.4 MG SL tablet Place 1 tablet (0.4 mg total) under the tongue every 5 (five) minutes as needed for chest pain.  25 tablet  1  . omeprazole (PRILOSEC) 20 MG capsule Take 20 mg by mouth 2 (two) times daily.        . Rivaroxaban (XARELTO) 20 MG TABS tablet Take 1 tablet (20 mg total) by  mouth daily.  30 tablet  3  . traMADol (ULTRAM) 50 MG tablet Take 1 tablet (50 mg total) by mouth every 6 (six) hours as needed for pain.  30 tablet  0  . digoxin (LANOXIN) 0.125 MG tablet Take 1 tablet (0.125 mg total) by mouth daily.  90 tablet  3   No current facility-administered medications for this visit.    Past Medical History  Diagnosis Date  . Ischemic cardiomyopathy     cardiomyopathy ischemic  . Paroxysmal atrial fibrillation   . Renal calculi   . 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 Chronic systolic dysfunction of left ventricle   . Coronary artery disease   . Chronic pain     chronic left abdominal pain    Past Surgical History  Procedure Laterality Date  . Difibrillator insertion  12/31/06    SJM Current DR RF ICD implanted by Dr Graciela Husbands    ZOX:WRUEAV of systems complete and found to be negative unless listed above  PHYSICAL EXAM Ht 5\' 6"  (1.676 m)  Wt 196 lb 4 oz (89.018 kg)  BMI 31.69 kg/m2  General: Well developed, well nourished, in no acute distress Head: Eyes PERRLA, No xanthomas.   Normal cephalic and atramatic  Lungs: Clear bilaterally to auscultation and percussion. Heart: HRIR S1 S2, tachycardic with 1/6 systolic murmur..  Pulses are 2+ &  equal.            No carotid bruit. No JVD.   Abdomen: Bowel sounds are positive, abdomen soft and non-tender without masses or                  Hernia's noted. Msk:  Back normal, normal gait. Normal strength and tone for age. Extremities: No clubbing, cyanosis or edema.  DP +1 Neuro: Alert and oriented X 3. Psych:  Tearful affect, responds appropriately  EKG: Atrial fibrillation, rate of 115 beats per minute, with nonspecific T-wave abnormality. Possible anterior fascicular block.  ASSESSMENT AND PLAN

## 2013-04-01 NOTE — Progress Notes (Deleted)
Name: [name]    DOB: Jun 05, 1951  Age: 62 y.o.  MR#: [id number]       PCP:  Kirstie Peri, MD      Insurance: Payor: MEDICARE / Plan: MEDICARE PART A AND B / Product Type: *No Product type* /   CC:    Chief Complaint  Patient presents with  . Coronary Artery Disease  . Atrial Fibrillation   PT NOTES FATIGUE FOR THE PAST FEW DAYS, NO ENERGY, THIS NURSE WATCHED HR GO FROM 135 TO 108 DURING EKG TODAY  VS Filed Vitals:   04/01/13 1410  Height: 5\' 6"  (1.676 m)  Weight: 196 lb 4 oz (89.018 kg)    Weights Current Weight  04/01/13 196 lb 4 oz (89.018 kg)  03/21/13 192 lb (87.091 kg)  12/10/12 193 lb (87.544 kg)    Blood Pressure  BP Readings from Last 3 Encounters:  03/21/13 116/77  12/10/12 117/83  12/10/12 117/83     Admit date:  (Not on file) Last encounter with RMR:  11/09/2012   Allergy Review of patient's allergies indicates no known allergies.  Current Outpatient Prescriptions  Medication Sig Dispense Refill  . amiodarone (PACERONE) 200 MG tablet Take 1 tablet (200 mg total) by mouth 2 (two) times daily.  30 tablet  6  . budesonide-formoterol (SYMBICORT) 160-4.5 MCG/ACT inhaler Inhale 2 puffs into the lungs 2 (two) times daily.      . furosemide (LASIX) 40 MG tablet Take 1 tablet (40 mg total) by mouth 2 (two) times daily.  60 tablet  3  . losartan (COZAAR) 50 MG tablet Take 1 tablet (50 mg total) by mouth daily.  90 tablet  0  . nitroGLYCERIN (NITROSTAT) 0.4 MG SL tablet Place 1 tablet (0.4 mg total) under the tongue every 5 (five) minutes as needed for chest pain.  25 tablet  1  . omeprazole (PRILOSEC) 20 MG capsule Take 20 mg by mouth 2 (two) times daily.        . Rivaroxaban (XARELTO) 20 MG TABS tablet Take 1 tablet (20 mg total) by mouth daily.  30 tablet  3  . traMADol (ULTRAM) 50 MG tablet Take 1 tablet (50 mg total) by mouth every 6 (six) hours as needed for pain.  30 tablet  0   No current facility-administered medications for this visit.    Discontinued  Meds:   There are no discontinued medications.  Patient Active Problem List   Diagnosis Date Noted  . Dyspnea 11/09/2012  . Cough syncope 06/16/2011  . Coronary artery disease 10/23/2010  . HYPERLIPIDEMIA 08/21/2010  . Atrial fibrillation 07/17/2010  . CHRONIC SYSTOLIC HEART FAILURE 07/17/2010  . ABDOMINAL PAIN OTHER SPECIFIED SITE 07/17/2010  . ELECTROCARDIOGRAM, ABNORMAL 07/17/2010  . CARDIOMYOPATHY, ISCHEMIC 04/19/2009  . ICD - IN SITU 04/19/2009    LABS    Component Value Date/Time   NA 142 12/07/2012 1600   NA 138 10/07/2012 1546   K 3.9 12/07/2012 1600   K 4.2 10/07/2012 1546   CL 101 12/07/2012 1600   CL 102 10/07/2012 1546   CO2 32 12/07/2012 1600   CO2 28 10/07/2012 1546   GLUCOSE 87 12/07/2012 1600   GLUCOSE 91 10/07/2012 1546   BUN 26* 12/07/2012 1600   BUN 25* 10/07/2012 1546   CREATININE 1.04 12/07/2012 1600   CREATININE 1.12 10/07/2012 1546   CALCIUM 9.2 12/07/2012 1600   CALCIUM 9.2 10/07/2012 1546   CMP     Component Value Date/Time   NA 142 12/07/2012  1600   K 3.9 12/07/2012 1600   CL 101 12/07/2012 1600   CO2 32 12/07/2012 1600   GLUCOSE 87 12/07/2012 1600   BUN 26* 12/07/2012 1600   CREATININE 1.04 12/07/2012 1600   CALCIUM 9.2 12/07/2012 1600   PROT 6.9 10/07/2012 1546   ALBUMIN 4.1 10/07/2012 1546   AST 26 10/07/2012 1546   ALT 26 10/07/2012 1546   ALKPHOS 71 10/07/2012 1546   BILITOT 0.5 10/07/2012 1546       Component Value Date/Time   WBC 9.9 12/08/2012 1223   WBC 12.1* 10/07/2012 1546   HGB 16.3 12/08/2012 1223   HGB 17.1* 10/07/2012 1546   HCT 48.5 12/08/2012 1223   HCT 49.6 10/07/2012 1546   MCV 91.5 12/08/2012 1223   MCV 93.6 10/07/2012 1546    Lipid Panel  No results found for this basename: chol, trig, hdl, cholhdl, vldl, ldlcalc    ABG No results found for this basename: phart, pco2, pco2art, po2, po2art, hco3, tco2, acidbasedef, o2sat     No results found for this basename: TSH   BNP (last 3 results) No results found for this basename: PROBNP,   in the last 8760 hours Cardiac Panel (last 3 results) No results found for this basename: CKTOTAL, CKMB, TROPONINI, RELINDX,  in the last 72 hours  Iron/TIBC/Ferritin No results found for this basename: iron, tibc, ferritin     EKG Orders placed in visit on 04/01/13  . EKG 12-LEAD     Prior Assessment and Plan Problem List as of 04/01/2013   Coronary artery disease   Last Assessment & Plan   10/07/2012 Office Visit Written 10/07/2012  4:22 PM by Jodelle Gross, NP     He states that he has no frank chest discomfort, but has chronic left upper quadrant pain which he said was also associated with an MI. I do not see where he has had a recent stress test although he was seen and evaluated at [facility] after medical noncompliance issues. He has had multiple PCI's including intervention of the LAD, circumflex, and right coronary artery. We will continue to follow him and consider repeating stress test should he remain symptomatic.    CARDIOMYOPATHY, ISCHEMIC   Last Assessment & Plan   12/07/2012 Office Visit Written 12/09/2012 12:40 PM by Marinus Maw, MD     Despite his elevated ventricular rate, the patient denies anginal symptoms. He will continue his current medical therapy.    ICD - IN SITU   Last Assessment & Plan   03/21/2013 Office Visit Written 03/22/2013  5:43 PM by Marinus Maw, MD     His [employer] dual-chamber ICD is working normally. No recent ICD therapies.    Atrial fibrillation   Last Assessment & Plan   03/21/2013 Office Visit Written 03/22/2013  5:45 PM by Marinus Maw, MD     We discussed the various treatment options in detail. That he is maintained atrial fibrillation on 200 mg of amiodarone  Suggest that we will need to do another cardioversion and up titration of his amiodarone if we're to keep him in sinus rhythm. He might be a candidate for atrial fibrillation ablation if he can be maintained sinus rhythm for several months on the higher dose of amiodarone. To this  end, we will plan cardioversion in 4-6 weeks. He will continue his anticoagulation on 400 mg daily of amiodarone.    CHRONIC SYSTOLIC HEART FAILURE   Last Assessment & Plan   03/21/2013 Office  Visit Written 03/22/2013  5:44 PM by Marinus Maw, MD     His chronic systolic heart failure is class II to class III. He states that he typically feels better with more energy when he is in sinus rhythm. He will continue his current medications, but I've asked him to up titrate his amiodarone to 4 mg daily.    ABDOMINAL PAIN OTHER SPECIFIED SITE   ELECTROCARDIOGRAM, ABNORMAL   HYPERLIPIDEMIA   Last Assessment & Plan   10/07/2012 Office Visit Written 10/07/2012  4:30 PM by Jodelle Gross, NP     Did not find that he has had followup labs concerning this. He is due to see Dr. Sherryll Burger shortly for ongoing medical needs. It is recommended that he have fasting lipids completed for ongoing risk management.    Cough syncope   Last Assessment & Plan   06/16/2011 Office Visit Written 06/16/2011  9:09 AM by June Leap, MD      We'll discontinue lisinopril and change to losartan 25 mg by mouth daily    Dyspnea   Last Assessment & Plan   11/09/2012 Office Visit Written 11/09/2012  1:59 PM by Jodelle Gross, NP     The patient is complaining of dyspnea on exertion, fatigue, and insomnia. He admits to snoring. He has never had a sleep study completed. We will schedule this to evaluate for obstructive sleep apnea which is also contributing to his atrial fibrillation.        Imaging: No results found.

## 2013-04-01 NOTE — Patient Instructions (Addendum)
Your physician recommends that you schedule a follow-up appointment in: 1 month  Your physician has recommended you make the following change in your medication:  1.Start digoxin 0.125 mg daily  Your physician recommends that you return for lab work in 3 weeks, will mail lab slips.

## 2013-04-01 NOTE — Assessment & Plan Note (Signed)
The patient refuses cardioversion or RFA. He wishes to be continued on medical management only. Heart rate is elevated today, and he states that it has been elevated under undue stress concerning his adolescent son. I will reinstitute digoxin 0.125 mg daily. He will followup with the BMET in 2 weeks, and be seen again in one month.  Of note, I have given him the phone number to [facility], and adolescent mental health agency, and [facility], with offices local to [place].

## 2013-04-01 NOTE — Assessment & Plan Note (Signed)
No evidence of decompensated CHF. There is no lower extremity edema. He states he has been feeling weak and tired but believes this to be related to his are in social circumstances concerning his son. Elevated heart rate may also be playing into this. Digoxin as directed above.

## 2013-04-05 ENCOUNTER — Encounter: Payer: Self-pay | Admitting: Internal Medicine

## 2013-04-19 ENCOUNTER — Encounter: Payer: Self-pay | Admitting: *Deleted

## 2013-04-19 ENCOUNTER — Other Ambulatory Visit: Payer: Self-pay | Admitting: *Deleted

## 2013-04-19 DIAGNOSIS — I4891 Unspecified atrial fibrillation: Secondary | ICD-10-CM

## 2013-04-29 ENCOUNTER — Encounter: Payer: Self-pay | Admitting: Adult Health

## 2013-04-29 ENCOUNTER — Encounter: Payer: Medicare Other | Admitting: Adult Health

## 2013-04-29 NOTE — Progress Notes (Signed)
    HPI: [name] is a 62 year old patient of Dr. Ladona Ridgel we are following for ongoing assessment and management of atrial fibrillation, recently placed on amiodarone and had cardioversion restoring him to normal sinus rhythm temporarily. He reverted back to atrial fibrillation. The patient was seen by Dr. Ladona Ridgel in August of 2014 for his ICD interrogation. He was continued on Coumadin. On last visit with me on 04/01/2013 the patient was complaining of rapid heart rhythm, but also having a lot of social issues. He continued amiodarone 2 mg twice a day per Dr. Ladona Ridgel. However his heart rate was 115 beats per minute. On that visit the patient was started on digoxin 0.125 mg daily, with followup BMET. He was not found to have any evidence of heart failure on that visit. He is here for close followup concerning his response to medication. Unfortunately his labs were not completed prior to this office visit.  No Known Allergies  Current Outpatient Prescriptions  Medication Sig Dispense Refill  . amiodarone (PACERONE) 200 MG tablet Take 1 tablet (200 mg total) by mouth 2 (two) times daily.  30 tablet  6  . budesonide-formoterol (SYMBICORT) 160-4.5 MCG/ACT inhaler Inhale 2 puffs into the lungs 2 (two) times daily.      . digoxin (LANOXIN) 0.125 MG tablet Take 1 tablet (0.125 mg total) by mouth daily.  90 tablet  3  . furosemide (LASIX) 40 MG tablet Take 1 tablet (40 mg total) by mouth 2 (two) times daily.  60 tablet  3  . losartan (COZAAR) 50 MG tablet Take 1 tablet (50 mg total) by mouth daily.  90 tablet  0  . nitroGLYCERIN (NITROSTAT) 0.4 MG SL tablet Place 1 tablet (0.4 mg total) under the tongue every 5 (five) minutes as needed for chest pain.  25 tablet  1  . omeprazole (PRILOSEC) 20 MG capsule Take 20 mg by mouth 2 (two) times daily.        . Rivaroxaban (XARELTO) 20 MG TABS tablet Take 1 tablet (20 mg total) by mouth daily.  30 tablet  3  . traMADol (ULTRAM) 50 MG tablet Take 1 tablet (50 mg  total) by mouth every 6 (six) hours as needed for pain.  30 tablet  0   No current facility-administered medications for this visit.    Past Medical History  Diagnosis Date  . Ischemic cardiomyopathy     cardiomyopathy ischemic  . Paroxysmal atrial fibrillation   . Renal calculi   . 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 Chronic systolic dysfunction of left ventricle   . Coronary artery disease   . Chronic pain     chronic left abdominal pain    Past Surgical History  Procedure Laterality Date  . Difibrillator insertion  12/31/06    SJM Current DR RF ICD implanted by Dr Graciela Husbands    ROS: PHYSICAL EXAM There were no vitals taken for this visit.  EKG:  ASSESSMENT AND PLAN

## 2013-05-09 ENCOUNTER — Encounter (HOSPITAL_COMMUNITY): Payer: Self-pay | Admitting: Certified Registered Nurse Anesthetist

## 2013-05-09 ENCOUNTER — Ambulatory Visit (HOSPITAL_COMMUNITY): Admission: RE | Admit: 2013-05-09 | Payer: Medicare Other | Source: Ambulatory Visit | Admitting: Internal Medicine

## 2013-05-09 ENCOUNTER — Encounter (HOSPITAL_COMMUNITY): Admission: RE | Payer: Self-pay | Source: Ambulatory Visit

## 2013-05-09 SURGERY — CARDIOVERSION
Anesthesia: Monitor Anesthesia Care

## 2013-05-09 NOTE — Preoperative (Signed)
Beta Blockers   Reason not to administer Beta Blockers:Not Applicable 

## 2013-05-09 NOTE — Anesthesia Preprocedure Evaluation (Deleted)
Anesthesia Evaluation  Patient identified by MRN, date of birth, ID band Patient awake    Reviewed: Allergy & Precautions, H&P , NPO status , Patient's Chart, lab work & pertinent test results, reviewed documented beta blocker date and time   Airway Mallampati: II TM Distance: >3 FB Neck ROM: full    Dental   Pulmonary shortness of breath and with exertion, Current Smoker,  breath sounds clear to auscultation        Cardiovascular + CAD and +CHF + dysrhythmias Atrial Fibrillation + Cardiac Defibrillator Rhythm:regular     Neuro/Psych negative neurological ROS  negative psych ROS   GI/Hepatic negative GI ROS, Neg liver ROS,   Endo/Other  negative endocrine ROS  Renal/GU Renal disease  negative genitourinary   Musculoskeletal   Abdominal   Peds  Hematology negative hematology ROS (+)   Anesthesia Other Findings See surgeon's H&P   Reproductive/Obstetrics negative OB ROS                           Anesthesia Physical Anesthesia Plan  ASA: III  Anesthesia Plan: General   Post-op Pain Management:    Induction: Intravenous  Airway Management Planned: Mask  Additional Equipment:   Intra-op Plan:   Post-operative Plan:   Informed Consent: I have reviewed the patients History and Physical, chart, labs and discussed the procedure including the risks, benefits and alternatives for the proposed anesthesia with the patient or authorized representative who has indicated his/her understanding and acceptance.   Dental Advisory Given  Plan Discussed with: CRNA and Surgeon  Anesthesia Plan Comments:         Anesthesia Quick Evaluation

## 2013-05-19 ENCOUNTER — Telehealth: Payer: Self-pay | Admitting: *Deleted

## 2013-05-19 NOTE — Telephone Encounter (Signed)
Entered in error

## 2013-06-22 ENCOUNTER — Encounter: Payer: Medicare Other | Admitting: Internal Medicine

## 2013-06-22 ENCOUNTER — Encounter: Payer: Self-pay | Admitting: Internal Medicine

## 2013-07-29 ENCOUNTER — Encounter: Payer: Self-pay | Admitting: *Deleted

## 2013-08-02 ENCOUNTER — Other Ambulatory Visit: Payer: Self-pay | Admitting: Internal Medicine

## 2013-08-02 DIAGNOSIS — I2510 Atherosclerotic heart disease of native coronary artery without angina pectoris: Secondary | ICD-10-CM

## 2013-08-02 MED ORDER — NITROGLYCERIN 0.4 MG SL SUBL: 0.4000 mg | SUBLINGUAL_TABLET | SUBLINGUAL | Status: AC | PRN

## 2013-08-02 NOTE — Telephone Encounter (Signed)
Received fax refill request  Rx # [id number]  Medication:  Nitrostat 0.4 mg Qty 25 Sig:  Dissolve one tablet under the tongue every 5 minutes as needed for chest pain / do not exceed a total of 3 doses in 15 minutes Physician:  [name]

## 2013-08-20 ENCOUNTER — Encounter (HOSPITAL_COMMUNITY): Payer: Self-pay | Admitting: Emergency Medicine

## 2013-08-20 ENCOUNTER — Inpatient Hospital Stay (HOSPITAL_COMMUNITY)
Admission: EM | Admit: 2013-08-20 | Discharge: 2013-08-23 | DRG: 292 | Disposition: A | Discharge status: Home / Self Care | Payer: Medicare Other | Attending: Internal Medicine | Admitting: Internal Medicine

## 2013-08-20 ENCOUNTER — Emergency Department (HOSPITAL_COMMUNITY): Payer: Medicare Other

## 2013-08-20 DIAGNOSIS — G8929 Other chronic pain: Secondary | ICD-10-CM | POA: Diagnosis present

## 2013-08-20 DIAGNOSIS — I482 Chronic atrial fibrillation, unspecified: Secondary | ICD-10-CM | POA: Diagnosis present

## 2013-08-20 DIAGNOSIS — Z87442 Personal history of urinary calculi: Secondary | ICD-10-CM

## 2013-08-20 DIAGNOSIS — I959 Hypotension, unspecified: Secondary | ICD-10-CM | POA: Diagnosis present

## 2013-08-20 DIAGNOSIS — I509 Heart failure, unspecified: Secondary | ICD-10-CM | POA: Diagnosis present

## 2013-08-20 DIAGNOSIS — Z95810 Presence of automatic (implantable) cardiac defibrillator: Secondary | ICD-10-CM

## 2013-08-20 DIAGNOSIS — I2589 Other forms of chronic ischemic heart disease: Secondary | ICD-10-CM | POA: Diagnosis present

## 2013-08-20 DIAGNOSIS — I255 Ischemic cardiomyopathy: Secondary | ICD-10-CM | POA: Diagnosis present

## 2013-08-20 DIAGNOSIS — J441 Chronic obstructive pulmonary disease with (acute) exacerbation: Secondary | ICD-10-CM | POA: Diagnosis present

## 2013-08-20 DIAGNOSIS — E785 Hyperlipidemia, unspecified: Secondary | ICD-10-CM | POA: Diagnosis present

## 2013-08-20 DIAGNOSIS — Z8249 Family history of ischemic heart disease and other diseases of the circulatory system: Secondary | ICD-10-CM

## 2013-08-20 DIAGNOSIS — F17200 Nicotine dependence, unspecified, uncomplicated: Secondary | ICD-10-CM | POA: Diagnosis present

## 2013-08-20 DIAGNOSIS — I2510 Atherosclerotic heart disease of native coronary artery without angina pectoris: Secondary | ICD-10-CM | POA: Diagnosis present

## 2013-08-20 DIAGNOSIS — R0902 Hypoxemia: Secondary | ICD-10-CM | POA: Diagnosis present

## 2013-08-20 DIAGNOSIS — I5043 Acute on chronic combined systolic (congestive) and diastolic (congestive) heart failure: Principal | ICD-10-CM | POA: Diagnosis present

## 2013-08-20 DIAGNOSIS — I10 Essential (primary) hypertension: Secondary | ICD-10-CM | POA: Diagnosis present

## 2013-08-20 DIAGNOSIS — I4891 Unspecified atrial fibrillation: Secondary | ICD-10-CM | POA: Diagnosis present

## 2013-08-20 DIAGNOSIS — I5021 Acute systolic (congestive) heart failure: Secondary | ICD-10-CM

## 2013-08-20 LAB — CBC WITH DIFFERENTIAL/PLATELET
BASOS PCT: 0 % (ref 0–1)
Basophils Absolute: 0.1 10*3/uL (ref 0.0–0.1)
EOS PCT: 2 % (ref 0–5)
Eosinophils Absolute: 0.2 10*3/uL (ref 0.0–0.7)
HEMATOCRIT: 47.3 % (ref 39.0–52.0)
Hemoglobin: 15.2 g/dL (ref 13.0–17.0)
Lymphocytes Relative: 12 % (ref 12–46)
Lymphs Abs: 1.4 10*3/uL (ref 0.7–4.0)
MCH: 31.8 pg (ref 26.0–34.0)
MCHC: 32.1 g/dL (ref 30.0–36.0)
MCV: 99 fL (ref 78.0–100.0)
Monocytes Absolute: 0.9 10*3/uL (ref 0.1–1.0)
Monocytes Relative: 8 % (ref 3–12)
Neutro Abs: 9.6 10*3/uL — ABNORMAL HIGH (ref 1.7–7.7)
Neutrophils Relative %: 79 % — ABNORMAL HIGH (ref 43–77)
Platelets: 204 10*3/uL (ref 150–400)
RBC: 4.78 MIL/uL (ref 4.22–5.81)
RDW: 14 % (ref 11.5–15.5)
WBC: 12.2 10*3/uL — ABNORMAL HIGH (ref 4.0–10.5)

## 2013-08-20 LAB — COMPREHENSIVE METABOLIC PANEL
ALBUMIN: 3.4 g/dL — AB (ref 3.5–5.2)
ALT: 33 U/L (ref 0–53)
AST: 22 U/L (ref 0–37)
Alkaline Phosphatase: 83 U/L (ref 39–117)
BUN: 23 mg/dL (ref 6–23)
CALCIUM: 8.8 mg/dL (ref 8.4–10.5)
CO2: 40 meq/L — AB (ref 19–32)
Chloride: 93 mEq/L — ABNORMAL LOW (ref 96–112)
Creatinine, Ser: 1.14 mg/dL (ref 0.50–1.35)
GFR calc Af Amer: 77 mL/min — ABNORMAL LOW (ref >90)
GFR calc non Af Amer: 67 mL/min — ABNORMAL LOW (ref >90)
Glucose, Bld: 90 mg/dL (ref 70–99)
Potassium: 3.9 mEq/L (ref 3.7–5.3)
Sodium: 139 mEq/L (ref 137–147)
Total Bilirubin: 0.5 mg/dL (ref 0.3–1.2)
Total Protein: 7.1 g/dL (ref 6.0–8.3)

## 2013-08-20 LAB — MRSA PCR SCREENING: MRSA by PCR: NEGATIVE

## 2013-08-20 LAB — INFLUENZA PANEL BY PCR (TYPE A & B)
H1N1 flu by pcr: NOT DETECTED
Influenza A By PCR: NEGATIVE
Influenza B By PCR: NEGATIVE

## 2013-08-20 LAB — TROPONIN I: Troponin I: <0.3 ng/mL (ref <0.30)

## 2013-08-20 LAB — PRO B NATRIURETIC PEPTIDE: PRO B NATRI PEPTIDE: 2069 pg/mL — AB (ref 0–125)

## 2013-08-20 LAB — MAGNESIUM: Magnesium: 2 mg/dL (ref 1.5–2.5)

## 2013-08-20 LAB — DIGOXIN LEVEL: DIGOXIN LVL: 0.7 ng/mL — AB (ref 0.8–2.0)

## 2013-08-20 MED ORDER — NITROGLYCERIN 0.4 MG SL SUBL: 0.4000 mg | SUBLINGUAL_TABLET | SUBLINGUAL | Status: DC | PRN

## 2013-08-20 MED ORDER — PREDNISONE 20 MG PO TABS
40.0000 mg | ORAL_TABLET | Freq: Every day | ORAL | Status: AC
  Administered 2013-08-21 – 2013-08-23 (×3): 40 mg via ORAL
  Filled 2013-08-20 (×3): qty 2

## 2013-08-20 MED ORDER — SODIUM CHLORIDE 0.9 % IJ SOLN: 3.0000 mL | INTRAMUSCULAR | Status: DC | PRN

## 2013-08-20 MED ORDER — IPRATROPIUM BROMIDE 0.02 % IN SOLN
0.5000 mg | RESPIRATORY_TRACT | Status: DC | PRN
  Administered 2013-08-20 – 2013-08-21 (×2): 0.5 mg via RESPIRATORY_TRACT
  Filled 2013-08-20 (×2): qty 2.5

## 2013-08-20 MED ORDER — FUROSEMIDE 10 MG/ML IJ SOLN
40.0000 mg | Freq: Two times a day (BID) | INTRAMUSCULAR | Status: DC
  Administered 2013-08-21 – 2013-08-23 (×5): 40 mg via INTRAVENOUS
  Filled 2013-08-20 (×5): qty 4

## 2013-08-20 MED ORDER — SODIUM CHLORIDE 0.9 % IV SOLN: 250.0000 mL | INTRAVENOUS | Status: DC | PRN

## 2013-08-20 MED ORDER — LEVOFLOXACIN IN D5W 750 MG/150ML IV SOLN
750.0000 mg | INTRAVENOUS | Status: DC
  Administered 2013-08-20: 750 mg via INTRAVENOUS
  Filled 2013-08-20: qty 150

## 2013-08-20 MED ORDER — LEVOFLOXACIN IN D5W 750 MG/150ML IV SOLN
INTRAVENOUS | Status: AC
  Filled 2013-08-20: qty 150

## 2013-08-20 MED ORDER — ONDANSETRON HCL 4 MG/2ML IJ SOLN: 4.0000 mg | Freq: Four times a day (QID) | INTRAMUSCULAR | Status: DC | PRN

## 2013-08-20 MED ORDER — BUDESONIDE-FORMOTEROL FUMARATE 160-4.5 MCG/ACT IN AERO
INHALATION_SPRAY | RESPIRATORY_TRACT | Status: AC
  Filled 2013-08-20: qty 6

## 2013-08-20 MED ORDER — DIGOXIN 125 MCG PO TABS
0.1250 mg | ORAL_TABLET | Freq: Every day | ORAL | Status: DC
  Administered 2013-08-20 – 2013-08-23 (×4): 0.125 mg via ORAL
  Filled 2013-08-20 (×4): qty 1

## 2013-08-20 MED ORDER — BUDESONIDE-FORMOTEROL FUMARATE 160-4.5 MCG/ACT IN AERO
2.0000 | INHALATION_SPRAY | Freq: Two times a day (BID) | RESPIRATORY_TRACT | Status: DC
  Administered 2013-08-20 – 2013-08-23 (×6): 2 via RESPIRATORY_TRACT
  Filled 2013-08-20: qty 6

## 2013-08-20 MED ORDER — ACETAMINOPHEN 325 MG PO TABS
650.0000 mg | ORAL_TABLET | ORAL | Status: DC | PRN
  Administered 2013-08-20 – 2013-08-21 (×2): 650 mg via ORAL
  Filled 2013-08-20 (×2): qty 2

## 2013-08-20 MED ORDER — LEVALBUTEROL HCL 0.63 MG/3ML IN NEBU
0.6300 mg | INHALATION_SOLUTION | Freq: Four times a day (QID) | RESPIRATORY_TRACT | Status: DC | PRN
  Administered 2013-08-20 – 2013-08-21 (×2): 0.63 mg via RESPIRATORY_TRACT
  Filled 2013-08-20 (×2): qty 3

## 2013-08-20 MED ORDER — RIVAROXABAN 20 MG PO TABS
20.0000 mg | ORAL_TABLET | Freq: Every day | ORAL | Status: DC
  Administered 2013-08-20 – 2013-08-23 (×4): 20 mg via ORAL
  Filled 2013-08-20 (×4): qty 1

## 2013-08-20 MED ORDER — FUROSEMIDE 10 MG/ML IJ SOLN
40.0000 mg | Freq: Once | INTRAMUSCULAR | Status: AC
  Administered 2013-08-20: 40 mg via INTRAVENOUS
  Filled 2013-08-20: qty 4

## 2013-08-20 MED ORDER — SODIUM CHLORIDE 0.9 % IJ SOLN
3.0000 mL | Freq: Two times a day (BID) | INTRAMUSCULAR | Status: DC
  Administered 2013-08-20 – 2013-08-23 (×6): 3 mL via INTRAVENOUS

## 2013-08-20 MED ORDER — AMIODARONE HCL 200 MG PO TABS
200.0000 mg | ORAL_TABLET | Freq: Every day | ORAL | Status: DC
  Administered 2013-08-21 – 2013-08-23 (×3): 200 mg via ORAL
  Filled 2013-08-20 (×3): qty 1

## 2013-08-20 MED ORDER — PANTOPRAZOLE SODIUM 40 MG PO TBEC
40.0000 mg | DELAYED_RELEASE_TABLET | Freq: Every day | ORAL | Status: DC
  Administered 2013-08-20 – 2013-08-23 (×4): 40 mg via ORAL
  Filled 2013-08-20 (×4): qty 1

## 2013-08-20 MED ORDER — METHYLPREDNISOLONE SODIUM SUCC 125 MG IJ SOLR
60.0000 mg | Freq: Once | INTRAMUSCULAR | Status: AC
  Administered 2013-08-20: 60 mg via INTRAVENOUS
  Filled 2013-08-20: qty 2

## 2013-08-20 MED ORDER — LOSARTAN POTASSIUM 50 MG PO TABS
50.0000 mg | ORAL_TABLET | Freq: Every day | ORAL | Status: DC
  Administered 2013-08-21 – 2013-08-23 (×3): 50 mg via ORAL
  Filled 2013-08-20 (×3): qty 1

## 2013-08-20 NOTE — ED Notes (Signed)
CRITICAL VALUE ALERT  Critical value received:  CO2  Date of notification:  08/20/13  Time of notification:  1633  Critical value read back:yes  Nurse who received alert:  Lake Bellsiffany Jayln Branscom, RN   MD notified (1st page):  [phone]

## 2013-08-20 NOTE — ED Notes (Signed)
Pt states he checked his BP at home and it was 87/54. Pt reports dizziness and headache. Pt denies chest pain and SOB.

## 2013-08-20 NOTE — ED Notes (Signed)
Patient c/o shortness of breath, leg swelling, and hypotension for approximately 1 week. Patient noted to get dyspneic with standing and dizzy when standing during orthostatic vital signs. 2+ pitting edema to bilateral lower legs.

## 2013-08-20 NOTE — H&P (Signed)
[facility] History and Physical  CALIB WADHWA ZOX:[id number] DOB: [birth year] DOA: 08/20/2013  Referring physician: Dr Estell Harpin PCP: Kirstie Peri, MD   Chief Complaint: sob since 2 weeks.   HPI: [name] is a 63 y.o. male with prior h/o ischemic cadiomyopathy, hypertension, copd, active smoker, reports compliant to medications, came in for worsening sob and pedal edema, mild orthopnea,. He was found to be slightly hypoxic, hypotensive. His CXR Revealed CHF, with interstitial edema. Pro bnp elevated. Labs revealed mild leukocytosis. He denies fever or chills. He also reports loose stools for 1 days. He is referred to hospitalist service for admission for CHF exacerbation.    Review of Systems:  See HPI otherwise negative.   Past Medical History  Diagnosis Date  . Ischemic cardiomyopathy     cardiomyopathy ischemic  . Paroxysmal atrial fibrillation   . Renal calculi   . 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 Chronic systolic dysfunction of left ventricle   . Coronary artery disease   . Chronic pain     chronic left abdominal pain   Past Surgical History  Procedure Laterality Date  . Difibrillator insertion  12/31/06    SJM Current DR RF ICD implanted by Dr Graciela Husbands   Social History:  reports that he has been smoking Cigarettes.  He has a 20 pack-year smoking history. He has never used smokeless tobacco. He reports that he does not drink alcohol or use illicit drugs.  No Known Allergies  Family History  Problem Relation Age of Onset  . Coronary artery disease Other     family history of CAD     Prior to Admission medications   Medication Sig Start Date End Date Taking? Authorizing Provider  amiodarone (PACERONE) 200 MG tablet Take 200 mg by mouth daily. 03/21/13  Yes Marinus Maw, MD  budesonide-formoterol [facility]) 160-4.5 MCG/ACT inhaler Inhale 2 puffs into the lungs 2 (two) times daily.   Yes Historical Provider, MD  digoxin  (LANOXIN) 0.125 MG tablet Take 1 tablet (0.125 mg total) by mouth daily. 04/01/13  Yes Jodelle Gross, NP  furosemide (LASIX) 40 MG tablet Take 1 tablet (40 mg total) by mouth 2 (two) times daily. 03/21/13  Yes Marinus Maw, MD  losartan (COZAAR) 50 MG tablet Take 1 tablet (50 mg total) by mouth daily. 03/21/13  Yes Marinus Maw, MD  nitroGLYCERIN (NITROSTAT) 0.4 MG SL tablet Place 1 tablet (0.4 mg total) under the tongue every 5 (five) minutes as needed for chest pain. 08/02/13  Yes Marinus Maw, MD  omeprazole (PRILOSEC) 20 MG capsule Take 20 mg by mouth 2 (two) times daily.     Yes Historical Provider, MD  Rivaroxaban (XARELTO) 20 MG TABS tablet Take 1 tablet (20 mg total) by mouth daily. 03/21/13  Yes Marinus Maw, MD   Physical Exam: Filed Vitals:   08/20/13 1700  BP: 108/70  Pulse: 82  Resp: 22    BP 108/70  Pulse 82  Resp 22  General:  Appears calm and comfortable Eyes: PERRL, normal lids, irises & conjunctiva ENT: grossly normal hearing, lips & tongue Neck: no LAD, masses or thyromegaly Cardiovascular: irregular RR, no m/r/g. LE edema.2  + Respiratory: bibasilar rales and scattered wheezing heard.  Abdomen: soft, ntnd Skin: no rash or induration seen on limited exam Musculoskeletal: 2 + pedal edema.  Psychiatric: grossly normal mood and affect, speech fluent and appropriate Neurologic: grossly non-focal.  Labs on Admission:  Basic Metabolic Panel:  Recent Labs Lab 08/20/13 1537  NA 139  K 3.9  CL 93*  CO2 40*  GLUCOSE 90  BUN 23  CREATININE 1.14  CALCIUM 8.8   Liver Function Tests:  Recent Labs Lab 08/20/13 1537  AST 22  ALT 33  ALKPHOS 83  BILITOT 0.5  PROT 7.1  ALBUMIN 3.4*   No results found for this basename: LIPASE, AMYLASE,  in the last 168 hours No results found for this basename: AMMONIA,  in the last 168 hours CBC:  Recent Labs Lab 08/20/13 1537  WBC 12.2*  NEUTROABS 9.6*  HGB 15.2  HCT 47.3  MCV 99.0  PLT 204    Cardiac Enzymes:  Recent Labs Lab 08/20/13 1537  TROPONINI <0.30    BNP (last 3 results)  Recent Labs  08/20/13 1539  PROBNP 2069.0*   CBG: No results found for this basename: GLUCAP,  in the last 168 hours  Radiological Exams on Admission: Dg Chest Portable 1 View  08/20/2013   CLINICAL DATA:  Shortness of breath.  EXAM: PORTABLE CHEST - 1 VIEW  COMPARISON:  10/07/2012.  FINDINGS: Cardiomegaly with pulmonary vascular prominence . Bilateral interstitial prominence with right pleural effusion noted. These findings are consistent with congestive heart failure. Cardiac pacer noted with lead tips in right atrium and right ventricle. No pneumothorax.  IMPRESSION: Congestive heart failure with pulmonary interstitial edema and right-sided pleural effusion. Underlying pneumonia, particularly in the right lower lobe cannot be excluded .   Electronically Signed   By: Maisie Fushomas  Register   On: 08/20/2013 16:45    EKG: atrial fibrillation 85/min.   Assessment/Plan Active Problems:   CHF (congestive heart failure)   CHF exacerbation   Acute systolic heart failure:  - admit to step down for hypotension.  - he already received IV lasix in ED, .  - IV diuresis, and monitor renal function.  - echocardiogram ordered.  - repeat labs in am.  - daily weights, I/O'S and oxygen sat's > 90 %   Chronic atrial fibrillation:  - rate controlled.  - resume amiodarone and anticoagulation.    Copd exacerbation: - quick prednisone taper and bronchodilators as needed.  - smoking cessation counseling.  - levaquin for bronchitis.    Mild leukocytosis: - CXR shows pulm edema, underlying pneumonia cannot be excluded.  - UA pending.   DVT prophylaxis.   Code Status: full code Family Communication: discussed with the patient. Disposition Plan: admit to stepdown  Time spent: 65 min  [facility] Sum[facility] CountyKULA,Shubh Chiara [facility] Pager [phone]

## 2013-08-20 NOTE — ED Provider Notes (Signed)
CSN: [id number]     Arrival date & time 08/20/13  1512 History  This chart was scribed for [name] Lennert, MD by Shari Heritage, ED Scribe. The patient was seen in room APA15/APA15. Patient's care was started at [facility] PM.   Chief Complaint  Patient presents with  . Hypotension    Patient is a 63 y.o. male presenting with headaches. The history is provided by the patient. No language interpreter was used.  Headache Pain location:  Generalized Radiates to:  Does not radiate Duration:  1 day Timing:  Constant Progression:  Unchanged Associated symptoms: fatigue   Associated symptoms: no abdominal pain, no back pain, no congestion, no cough, no diarrhea, no seizures and no sinus pressure     HPI Comments: SHUNSUKE GRANZOW is a 63 y.o. male with history of ischemic cardiomyopathy, atrial fibrillation and chronic systolic dysfunction of the left ventricle who presents to the Emergency Department complaining of hypotension. He is reporting worsening bilateral leg swelling for the past week. He says that he has a history of leg swelling but this has been worse for the last several days. He has been elevating his legs above his heart for the past 2 days with no improvement. He usually takes Lasix 40 mg bid, but this morning he took 80 mg at about 10 AM and took 80 mg again earlier this afternoon. He also has a constant headache and fatigue. His blood pressure is usually 120/80, but today when he checked it at the drug store it was 87/54 prompting him to come to the ED for evaluation. He is also complaining of a constant generalized headache that began today with associated fatigue. He is a current smoker of 4-5 cigarettes per day.   Past Medical History  Diagnosis Date  . Ischemic cardiomyopathy     cardiomyopathy ischemic  . Paroxysmal atrial fibrillation   . Renal calculi   . 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 Chronic systolic dysfunction of left ventricle   .  Coronary artery disease   . Chronic pain     chronic left abdominal pain   Past Surgical History  Procedure Laterality Date  . Difibrillator insertion  12/31/06    SJM Current DR RF ICD implanted by Dr Graciela Husbands   Family History  Problem Relation Age of Onset  . Coronary artery disease Other     family history of CAD   History  Substance Use Topics  . Smoking status: Current Some Day Smoker -- 0.50 packs/day for 40 years    Types: Cigarettes  . Smokeless tobacco: Never Used     Comment: he is not willing to quit-notes he smokes 3-4 cig's at least every other day 04-01-13  . Alcohol Use: No    Review of Systems  Constitutional: Positive for fatigue. Negative for appetite change.  HENT: Negative for congestion, ear discharge and sinus pressure.   Eyes: Negative for discharge.  Respiratory: Negative for cough and shortness of breath.   Cardiovascular: Positive for leg swelling. Negative for chest pain.  Gastrointestinal: Negative for abdominal pain and diarrhea.  Genitourinary: Negative for frequency and hematuria.  Musculoskeletal: Negative for back pain.  Skin: Negative for rash.  Neurological: Positive for headaches. Negative for seizures.  Psychiatric/Behavioral: Negative for hallucinations.  All other systems reviewed and are negative.    Allergies  Review of patient's allergies indicates no known allergies.  Home Medications   Current Outpatient Rx  Name  Route  Sig  Dispense  Refill  . amiodarone (PACERONE) 200 MG tablet   Oral   Take 1 tablet (200 mg total) by mouth 2 (two) times daily.   30 tablet   6   . budesonide-formoterol (SYMBICORT) 160-4.5 MCG/ACT inhaler   Inhalation   Inhale 2 puffs into the lungs 2 (two) times daily.         . digoxin (LANOXIN) 0.125 MG tablet   Oral   Take 1 tablet (0.125 mg total) by mouth daily.   90 tablet   3   . furosemide (LASIX) 40 MG tablet   Oral   Take 1 tablet (40 mg total) by mouth 2 (two) times daily.   60  tablet   3   . losartan (COZAAR) 50 MG tablet   Oral   Take 1 tablet (50 mg total) by mouth daily.   90 tablet   0   . nitroGLYCERIN (NITROSTAT) 0.4 MG SL tablet   Sublingual   Place 1 tablet (0.4 mg total) under the tongue every 5 (five) minutes as needed for chest pain.   25 tablet   2     Patient needs to call office and schedule 3 month  ...   . omeprazole (PRILOSEC) 20 MG capsule   Oral   Take 20 mg by mouth 2 (two) times daily.           . Rivaroxaban (XARELTO) 20 MG TABS tablet   Oral   Take 1 tablet (20 mg total) by mouth daily.   30 tablet   3     Patient needs to call office and schedule 3 month  ...   . traMADol (ULTRAM) 50 MG tablet   Oral   Take 1 tablet (50 mg total) by mouth every 6 (six) hours as needed for pain.   30 tablet   0    There were no vitals taken for this visit. Physical Exam  Constitutional: He is oriented to person, place, and time. He appears well-developed.  HENT:  Head: Normocephalic.  Eyes: Conjunctivae and EOM are normal. No scleral icterus.  Neck: Neck supple. No thyromegaly present.  Cardiovascular: Normal rate and regular rhythm.  Exam reveals no gallop and no friction rub.   No murmur heard. Pulmonary/Chest: No stridor. He has no wheezes. He has no rales. He exhibits no tenderness.  Abdominal: He exhibits no distension. There is no tenderness. There is no rebound.  Musculoskeletal: Normal range of motion. He exhibits edema.  2+ edema in bilateral ankles.  Lymphadenopathy:    He has no cervical adenopathy.  Neurological: He is oriented to person, place, and time. He exhibits normal muscle tone. Coordination normal.  Skin: No rash noted. No erythema.  Psychiatric: He has a normal mood and affect. His behavior is normal.    ED Course  Procedures (including critical care time)  COORDINATION OF CARE: 3:30 PM- Patient informed of current plan for treatment and evaluation and agrees with plan at this time.   5:27 PM-  Discussed plans for pt to be admitted, and he agreed.  Labs Review Labs Reviewed - No data to display Imaging Review No results found.  EKG Interpretation    Date/Time:  Saturday August 20 2013 15:54:47 EST Ventricular Rate:  90 PR Interval:    QRS Duration: 120 QT Interval:  406 QTC Calculation: 496 R Axis:   -150 Text Interpretation:  Undetermined rhythm Possible Right ventricular hypertrophy Inferior infarct (cited on or before 21-Dec-2006) Anterolateral infarct (  cited on or before 21-Dec-2006) Abnormal ECG When compared with ECG of 10-Dec-2012 11:40, Current undetermined rhythm precludes rhythm comparison, needs review Questionable change in initial forces of Lateral leads T wave inversion now evident in Anterior leads Confirmed by Oriel Ojo  MD, Adewale Pucillo ([id number]) on 08/20/2013 5:38:57 PM            MDM  chf The chart was scribed for me under my direct supervision.  I personally performed the history, physical, and medical decision making and all procedures in the evaluation of this patient.[name] Lennert, MD 08/20/13 1739

## 2013-08-21 DIAGNOSIS — I517 Cardiomegaly: Secondary | ICD-10-CM

## 2013-08-21 LAB — BASIC METABOLIC PANEL
BUN: 26 mg/dL — ABNORMAL HIGH (ref 6–23)
CHLORIDE: 92 meq/L — AB (ref 96–112)
CO2: 35 mEq/L — ABNORMAL HIGH (ref 19–32)
Calcium: 9.1 mg/dL (ref 8.4–10.5)
Creatinine, Ser: 1.13 mg/dL (ref 0.50–1.35)
GFR calc Af Amer: 78 mL/min — ABNORMAL LOW (ref >90)
GFR calc non Af Amer: 67 mL/min — ABNORMAL LOW (ref >90)
Glucose, Bld: 149 mg/dL — ABNORMAL HIGH (ref 70–99)
Potassium: 3.9 mEq/L (ref 3.7–5.3)
SODIUM: 138 meq/L (ref 137–147)

## 2013-08-21 LAB — URINALYSIS, ROUTINE W REFLEX MICROSCOPIC
Bilirubin Urine: NEGATIVE
Glucose, UA: NEGATIVE mg/dL
Hgb urine dipstick: NEGATIVE
KETONES UR: NEGATIVE mg/dL
Leukocytes, UA: NEGATIVE
Nitrite: NEGATIVE
PROTEIN: NEGATIVE mg/dL
Specific Gravity, Urine: 1.01 (ref 1.005–1.030)
Urobilinogen, UA: 0.2 mg/dL (ref 0.0–1.0)
pH: 7 (ref 5.0–8.0)

## 2013-08-21 MED ORDER — HYDROCODONE-ACETAMINOPHEN 5-325 MG PO TABS
1.0000 | ORAL_TABLET | Freq: Once | ORAL | Status: AC
  Administered 2013-08-21: 1 via ORAL
  Filled 2013-08-21: qty 1

## 2013-08-21 MED ORDER — BIOTENE DRY MOUTH MT LIQD
15.0000 mL | Freq: Two times a day (BID) | OROMUCOSAL | Status: DC
  Administered 2013-08-21 – 2013-08-23 (×4): 15 mL via OROMUCOSAL

## 2013-08-21 MED ORDER — LEVOFLOXACIN 750 MG PO TABS
750.0000 mg | ORAL_TABLET | Freq: Every day | ORAL | Status: DC
  Administered 2013-08-21 – 2013-08-23 (×3): 750 mg via ORAL
  Filled 2013-08-21 (×3): qty 1

## 2013-08-21 NOTE — Progress Notes (Signed)
PHARMACIST - PHYSICIAN COMMUNICATION DR:     CONCERNING: Antibiotic IV to Oral Route Change Policy  RECOMMENDATION: This patient is receiving Levaquin by the intravenous route.  Based on criteria approved by the Pharmacy and Therapeutics Committee, the antibiotic(s) is/are being converted to the equivalent oral dose form(s).   DESCRIPTION: These criteria include:  Patient being treated for a respiratory tract infection, urinary tract infection, cellulitis or clostridium difficile associated diarrhea if on metronidazole  The patient is not neutropenic and does not exhibit a GI malabsorption state  The patient is eating (either orally or via tube) and/or has been taking other orally administered medications for a least 24 hours  The patient is improving clinically and has a Tmax < 100.5  If you have questions about this conversion, please contact the Pharmacy Department  [x]  ( [phone] )  [place] []  ( [phone] )  [place]  []  ( [phone] )  [facility] []  ( [phone] )  [facility]    

## 2013-08-21 NOTE — Progress Notes (Addendum)
[facility] PROGRESS NOTE  [name] ZOX:[id number] DOB: 1950/09/18 DOA: 08/20/2013 PCP: [name],ASHISH, MD bRIEF hpi:  [name] LemonsReuben S Lacock is a 63 y.o. male with prior h/o ischemic cadiomyopathy, hypertension, copd, active smoker, reports compliant to medications, came in for worsening sob and pedal edema, mild orthopnea,. He was found to be slightly hypoxic, hypotensive. His CXR Revealed CHF, with interstitial edema. Pro bnp elevated. Labs revealed mild leukocytosis. He denies fever or chills. He also reports loose stools for 1 days. He is referred to hospitalist service for admission for CHF exacerbation.   Assessment/Plan:  Acute systolic heart failure:  He was initially admitted to step down for hypotension, and was later tranferred to telemetry, as his BP parameters improved.  - IV diuresis, and monitor renal function.  - echocardiogram ordered.  - repeat labs in am.  - daily weights, I/O'S and oxygen sat's > 90 %  Chronic atrial fibrillation:  - rate controlled.  - resume amiodarone and anticoagulation.  Copd exacerbation:  - quick prednisone taper and bronchodilators as needed.  - smoking cessation counseling.  - levaquin for bronchitis.  Mild leukocytosis:  - CXR shows pulm edema, underlying pneumonia cannot be excluded.  - UA pending.  DVT prophylaxis.  Code Status: full code  Family Communication: discussed with the patient   Consultants: none  Procedures:  Echocardiogram.   Antibiotics:  levaquin.  HPI/Subjective: Wants to go home  Objective: Filed Vitals:   08/21/13 0944  BP:   Pulse: 104  Temp:   Resp:     Intake/Output Summary (Last 24 hours) at 08/21/13 1124 Last data filed at 08/21/13 1000  Gross per 24 hour  Intake   1020 ml  Output   2850 ml  Net  -1830 ml   Filed Weights   08/20/13 1930 08/21/13 0500  Weight: 88.3 kg (194 lb 10.7 oz) 88.9 kg (195 lb 15.8 oz)    Exam:   General:  Alert afebrile  comfortable  Cardiovascular: s1s2  Respiratory: scattered wheezing heard bilateral.   Abdomen: soft NT ND BS+  Musculoskeletal: pedal edema improved.   Data Reviewed: Basic Metabolic Panel:  Recent Labs Lab 08/20/13 1537 08/20/13 2227 08/21/13 0454  NA 139  --  138  K 3.9  --  3.9  CL 93*  --  92*  CO2 40*  --  35*  GLUCOSE 90  --  149*  BUN 23  --  26*  CREATININE 1.14  --  1.13  CALCIUM 8.8  --  9.1  MG  --  2.0  --    Liver Function Tests:  Recent Labs Lab 08/20/13 1537  AST 22  ALT 33  ALKPHOS 83  BILITOT 0.5  PROT 7.1  ALBUMIN 3.4*   No results found for this basename: LIPASE, AMYLASE,  in the last 168 hours No results found for this basename: AMMONIA,  in the last 168 hours CBC:  Recent Labs Lab 08/20/13 1537  WBC 12.2*  NEUTROABS 9.6*  HGB 15.2  HCT 47.3  MCV 99.0  PLT 204   Cardiac Enzymes:  Recent Labs Lab 08/20/13 1537  TROPONINI <0.30   BNP (last 3 results)  Recent Labs  08/20/13 1539  PROBNP 2069.0*   CBG: No results found for this basename: GLUCAP,  in the last 168 hours  Recent Results (from the past 240 hour(s))  MRSA PCR SCREENING     Status: None   Collection Time    08/20/13  6:36 PM  Result Value Range Status   MRSA by PCR NEGATIVE  NEGATIVE Final   Comment:            The GeneXpert MRSA Assay (FDA     approved for NASAL specimens     only), is one component of a     comprehensive MRSA colonization     surveillance program. It is not     intended to diagnose MRSA     infection nor to guide or     monitor treatment for     MRSA infections.     Studies: Dg Chest Portable 1 View  08/20/2013   CLINICAL DATA:  Shortness of breath.  EXAM: PORTABLE CHEST - 1 VIEW  COMPARISON:  10/07/2012.  FINDINGS: Cardiomegaly with pulmonary vascular prominence . Bilateral interstitial prominence with right pleural effusion noted. These findings are consistent with congestive heart failure. Cardiac pacer noted with lead tips  in right atrium and right ventricle. No pneumothorax.  IMPRESSION: Congestive heart failure with pulmonary interstitial edema and right-sided pleural effusion. Underlying pneumonia, particularly in the right lower lobe cannot be excluded .   Electronically Signed   By: Maisie Fus  Register   On: 08/20/2013 16:45    Scheduled Meds: . amiodarone  200 mg Oral Daily  . antiseptic oral rinse  15 mL Mouth Rinse BID  . budesonide-formoterol  2 puff Inhalation BID  . digoxin  0.125 mg Oral Daily  . furosemide  40 mg Intravenous Q12H  . levofloxacin (LEVAQUIN) IV  750 mg Intravenous Q24H  . losartan  50 mg Oral Daily  . pantoprazole  40 mg Oral Daily  . predniSONE  40 mg Oral QAC breakfast  . Rivaroxaban  20 mg Oral Daily  . sodium chloride  3 mL Intravenous Q12H   Continuous Infusions:   Active Problems:   Atrial fibrillation   HYPERLIPIDEMIA   Coronary artery disease   CHF (congestive heart failure)   CHF exacerbation    Time spent: 30 min    Davina Howlett  [facility] Pager [phone] If 7PM-7AM, please contact night-coverage at [email or web address], password [facility] 08/21/2013, 11:24 AM  LOS: 1 day

## 2013-08-21 NOTE — Progress Notes (Signed)
[facility] to [facility]. Patient transferred to 311 via wheelchair in stable condition.

## 2013-08-21 NOTE — Progress Notes (Signed)
  Echocardiogram 2D Echocardiogram has been performed.  Nestor RampRoberts, Ondre Salvetti M 08/21/2013, 11:05 AM

## 2013-08-21 NOTE — Plan of Care (Signed)
Pt has been have several runs of VTAC up to 8 in row for last couple hours.  Pt is always asymptomatic an AFIB hx. Texted Dr. Blake DivineAkula to inform.

## 2013-08-22 DIAGNOSIS — I5021 Acute systolic (congestive) heart failure: Secondary | ICD-10-CM

## 2013-08-22 DIAGNOSIS — Z95810 Presence of automatic (implantable) cardiac defibrillator: Secondary | ICD-10-CM

## 2013-08-22 LAB — BASIC METABOLIC PANEL
BUN: 29 mg/dL — ABNORMAL HIGH (ref 6–23)
CALCIUM: 9.2 mg/dL (ref 8.4–10.5)
CO2: 35 meq/L — AB (ref 19–32)
Chloride: 97 mEq/L (ref 96–112)
Creatinine, Ser: 1 mg/dL (ref 0.50–1.35)
GFR calc Af Amer: >90 mL/min (ref >90)
GFR calc non Af Amer: 78 mL/min — ABNORMAL LOW (ref >90)
Glucose, Bld: 117 mg/dL — ABNORMAL HIGH (ref 70–99)
Potassium: 4.5 mEq/L (ref 3.7–5.3)
Sodium: 140 mEq/L (ref 137–147)

## 2013-08-22 LAB — MAGNESIUM: Magnesium: 2.2 mg/dL (ref 1.5–2.5)

## 2013-08-22 MED ORDER — METOPROLOL SUCCINATE ER 25 MG PO TB24
12.5000 mg | ORAL_TABLET | Freq: Every day | ORAL | Status: DC
  Administered 2013-08-22 – 2013-08-23 (×2): 12.5 mg via ORAL
  Filled 2013-08-22 (×2): qty 1

## 2013-08-22 MED ORDER — METOPROLOL SUCCINATE ER 25 MG PO TB24: 12.5000 mg | ORAL_TABLET | Freq: Every day | ORAL | Status: DC

## 2013-08-22 NOTE — Consult Note (Signed)
Consulting cardiologist: Dina RichJonathan Milinda Sweeney MD  Clinical Summary [name] is a 63 y.o.male history of ischemic CM, HTN, afib, COPD admitted with increased SOB, lower extremity edema, and orthopnea. CXR showed pulmonary edema, pro-BNP 2069 (no baseline in our system),  Trop neg x1, digoxin level 0.7, Cr 1.14, K 3.9. EKG poor quality, appears afib with normal rate.  He is negative 1.5 liters since admission, renal function remains stable.   - From cardiac standpoint history of ICM LVEF 20-25% by echo Jan 2015 (prior 25-30%), extensive history of coronary stents to LAD, LCX, and RCA with last cath in our system 2008 showing separate LAD and LCX ostia, LAD 40-50% at ositum with mid vessel patent stent 30% ISR, LCX with overlapping stents, RCA diffuse 30-50% disease at the ostium with a widely patent stent distal. He has a [employer] dual chamber AICD. Appeared to have been on coreg up until 09/2012, unclear from notes why he is no longer on.   -History of atrial fibrillation on amiodarone, prior DCCV but went back into afib. Continued on amio, last notes from Dr [name] 02/2013 mention possible repeat attempt at DCCV, however patient reports he declined. He is on anticoagulation with xarelto.    No Known Allergies  Medications Scheduled Medications: . amiodarone  200 mg Oral Daily  . antiseptic oral rinse  15 mL Mouth Rinse BID  . budesonide-formoterol  2 puff Inhalation BID  . digoxin  0.125 mg Oral Daily  . furosemide  40 mg Intravenous Q12H  . levofloxacin  750 mg Oral Daily  . losartan  50 mg Oral Daily  . pantoprazole  40 mg Oral Daily  . predniSONE  40 mg Oral QAC breakfast  . Rivaroxaban  20 mg Oral Daily  . sodium chloride  3 mL Intravenous Q12H     Infusions:     PRN Medications:  sodium chloride, acetaminophen, ipratropium, levalbuterol, nitroGLYCERIN, ondansetron (ZOFRAN) IV, sodium chloride   Past Medical History  Diagnosis Date  . Ischemic cardiomyopathy    cardiomyopathy ischemic  . Paroxysmal atrial fibrillation   . Renal calculi   . 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 Chronic systolic dysfunction of left ventricle   . Coronary artery disease   . Chronic pain     chronic left abdominal pain    Past Surgical History  Procedure Laterality Date  . Difibrillator insertion  12/31/06    SJM Current DR RF ICD implanted by Dr Graciela HusbandsKlein    Family History  Problem Relation Age of Onset  . Coronary artery disease Other     family history of CAD    Social History [name] reports that he has been smoking Cigarettes.  He has a 20 pack-year smoking history. He uses smokeless tobacco. [name] reports that he does not drink alcohol.  Review of Systems CONSTITUTIONAL: No weight loss, fever, chills, weakness or fatigue.  HEENT: Eyes: No visual loss, blurred vision, double vision or yellow sclerae. No hearing loss, sneezing, congestion, runny nose or sore throat.  SKIN: No rash or itching.  CARDIOVASCULAR: occasional chest pain RESPIRATORY: No shortness of breath, cough or sputum.  GASTROINTESTINAL: No anorexia, nausea, vomiting or diarrhea. No abdominal pain or blood.  GENITOURINARY: no polyuria, no dysuria NEUROLOGICAL: No headache, dizziness, syncope, paralysis, ataxia, numbness or tingling in the extremities. No change in bowel or bladder control.  MUSCULOSKELETAL: No muscle, back pain, joint pain or stiffness.  HEMATOLOGIC: No anemia, bleeding or  bruising.  LYMPHATICS: No enlarged nodes. No history of splenectomy.  PSYCHIATRIC: No history of depression or anxiety.      Physical Examination Blood pressure 104/73, pulse 94, temperature 97.4 F (36.3 C), temperature source Oral, resp. rate 22, height 5\' 7"  (1.702 m), weight 189 lb 4.8 oz (85.866 kg), SpO2 91.00%.  Intake/Output Summary (Last 24 hours) at 08/22/13 1017 Last data filed at 08/22/13 0829  Gross per 24 hour  Intake    803 ml  Output    3275 ml  Net  -2472 ml    Cardiovascular: irreg, no m/r/g, JVD just below angle of jaw  Respiratory: bilateral expiratory wheeze  GI: abdomen soft, NT, ND  MSK: extremities are warm, no edema  Neuro: no focal deficits   Lab Results  Basic Metabolic Panel:  Recent Labs Lab 08/20/13 1537 08/20/13 2227 08/21/13 0454 08/22/13 0511  NA 139  --  138 140  K 3.9  --  3.9 4.5  CL 93*  --  92* 97  CO2 40*  --  35* 35*  GLUCOSE 90  --  149* 117*  BUN 23  --  26* 29*  CREATININE 1.14  --  1.13 1.00  CALCIUM 8.8  --  9.1 9.2  MG  --  2.0  --  2.2    Liver Function Tests:  Recent Labs Lab 08/20/13 1537  AST 22  ALT 33  ALKPHOS 83  BILITOT 0.5  PROT 7.1  ALBUMIN 3.4*    CBC:  Recent Labs Lab 08/20/13 1537  WBC 12.2*  NEUTROABS 9.6*  HGB 15.2  HCT 47.3  MCV 99.0  PLT 204    Cardiac Enzymes:  Recent Labs Lab 08/20/13 1537  TROPONINI <0.30    BNP: No components found with this basename: POCBNP,    ECG Poor quality, afib  Imaging 08/21/13 Echo LVEF 20-25%, dyskinesis of the mid-distalinferoseptal myocardium, apical akinesis, grade II diastolic dysfunction, mode LAE, PASP 29   Cath 2002 RESULTS: There was no left main since there were separate ostia of the LAD  and circumflex artery.  Left anterior descending: The left anterior descending artery gave rise to  three diagonal branches and a large septal perforator. The stent which was  located in the proximal and mid vessel had 40% narrowing in its proximal  portion and 50% narrowing in its distal portion. The rest of the vessel was  free of major obstruction.  Circumflex artery: The circumflex artery gave rise to a large intermediate  Makeya Hilgert which bifurcated into three sub-branches. The circumflex also gave  rise to an AV Vonte Rossin which terminated into two small posterolateral branches.  There were tandem 50, 40 and 50 lesions in the proximal and midportion of the  circumflex marginal Berenize Gatlin.    Right coronary artery: The right coronary is a moderate sized vessel that  gave rise to two right ventricular Aundra Espin, a posterior descending Harrison Paulson and a  posterolateral Chakara Bognar. There were tandem overlying stents in the proximal  right coronary artery with 30% segmental narrowing in the mid to distal  portions of the stents. There was 50% narrowing in the mid right coronary  artery.  LEFT VENTRICULOGRAPHY: The left ventriculogram performed in the RAO  projection showed hypokinesis of the inferior wall. The anterolateral wall  moved well. The estimated ejection fraction was 45%.  DISTAL AORTOGRAM: Distal aortogram was performed which showed patent renal  artery stenosis and no significant aortoiliac obstruction.  The aortic pressure was 87/62 and left ventricular pressure  was 87/22.  CONCLUSIONS: Coronary artery disease, status post multiple prior percutaneous  coronary interventions with 40% within the stent and the proximal LAD, 50%  narrowing in the circumflex marginal vessel, 30% narrowing within the stents  in the proximal right coronary artery with 50% narrowing in the mid right  coronary artery and inferior wall hypokinesis.  RECOMMENDATIONS: None of the lesions appear tight enough to be flow-limiting  or to cause ischemia. I am not certain of the etiology of the patients chest  pain, tachycardia and elevated white count. We will plan to keep him for  observation and repeat his blood count tomorrow. I discussed these findings  with Dr. Antoine Poche.  Cath 2008 HEMODYNAMIC DATA:  1. Central aortic pressure 94/68, mean 81.  2. Left ventricular pressure 111/33.  3. There was no gradient on pullback across aortic valve.  ANGIOGRAPHIC DATA.:  1. Ventriculography was done in the RAO projection. Estimated  ejection fraction would be in the range of 30-35%. There did not  appear to be significant mitral regurgitation, although mild mitral  regurgitation cannot be excluded. The  anteroapical, apical and  distal inferior wall all appeared to be akinetic although the  inferior base and the anterior base seem to move reasonably well.  2. The left anterior descending artery has about 40% to perhaps 50%  segmental plaquing at the ostium. The vessel then opens up and is  stented in the midportion which has 30-40% in-stent narrowing noted  in the RAO views, but this is less impressive in the RAO cranial  and LAO cranial views. The mid and distal LAD appear to be free of  critical disease.  3. The circumflex proper demonstrates a very large marginal Hazen Brumett.  There are overlapping stents going into the circumflex system.  There are multiple distal sub branches which are widely patent.  This stent itself is also patent with no more than about 10-20%  narrowing. There is a small AV circumflex which comes off the  midportion of the stent. This has sort of a hazy ostium. There is  also suggestion from the right coronary injection that perhaps this  AV circumflex fills retrograde.  4. The right coronary artery has about 30-50% segmental narrowing  proximally and then there is a stent with probably 20-30%  narrowing. The PDA and PLA are without critical narrowing.  IMPRESSION:  1. Moderately severe left ventricular dysfunction with elevated LVEDP  and wall motion abnormalities as described.  2. Continued patency of the LAD stent with moderate ostial LAD  disease, although not critical.  3. Continued patency of the circumflex stent with subtotal occlusion  of the AV circumflex which is small.  4. Mild to moderate ostial narrowing of the right coronary artery with  continued patency of the mid vessel stent.    Impression/Recommendations  1. ICM - admitted with acute on chronic systolic heart failure, symptoms improving with diuresis but have not resolved. Still with some evidence of mild volume overload by exam.  - recommend continuing IV diuresis today, likely transition  to oral tomorrow - his medication regimen is unclear to me, he is not on beta blocker currently. From notes appears it was stopped sometime around 09/2012 but reason not indicated. He would also be a good candidate for spironolactone. He is more euvolemic now, will start low dose Toprol XL and follow tolerance.  2. Chest pain - reports 3 episodes of chest pain over the last few months, 10/10 sharp pain in mid chest which  resolve with NG. Has not had symptoms recently. - last cath in 2008 with patent stents, moderate non-obstructive disease - he is on no antianginals at home, will start low dose Toprol XL in setting of CAD and systolic dysfunction.  - he is not on ASA because he is on xarelto  3. Afib - no current palpitations, telemetry shows rate controlled afib - continue amio and xarelto for now, he refused repeat DCCV previously - follow up with Dr Ladona Ridgel as outpatient   Dina Rich, M.D., F.A.C.C.

## 2013-08-22 NOTE — Progress Notes (Signed)
08/22/13 1752 Patient had 8 beat run of v-tach. Currently in afib, HR in the 90s, pt asymptomatic. Notified Dr. Isidoro Donningai, also notified of EKG results from today. No new orders at this time, nursing staff to monitor. Earnstine RegalAshley Amadeus Oyama, RN

## 2013-08-22 NOTE — Progress Notes (Signed)
UR chart review completed.  

## 2013-08-22 NOTE — Care Management Note (Addendum)
    Page 1 of 1   08/23/2013     2:41:51 PM   CARE MANAGEMENT NOTE 08/23/2013  Patient:  [name],[name]   Account Number:  [id number]  Date Initiated:  08/22/2013  Documentation initiated by:  Sharrie RothmanBLACKWELL,Renell Coaxum C  Subjective/Objective Assessment:   Pt admitted from home with CHF. Pt lives with his 64 yo son and is anxious to get home to him. Pt is independent with ADL'[name]. Pt has home O2 with AHC to wear at night, 2 liters.     Action/Plan:   No CM needs noted.   Anticipated DC Date:  08/24/2013   Anticipated DC Plan:  HOME/SELF CARE      DC Planning Services  CM consult      Choice offered to / List presented to:             Status of service:  Completed, signed off Medicare Important Message given?  YES (If response is "NO", the following Medicare IM given date fields will be blank) Date Medicare IM given:  08/23/2013 Date Additional Medicare IM given:    Discharge Disposition:  HOME/SELF CARE  Per UR Regulation:    If discussed at Long Length of Stay Meetings, dates discussed:    Comments:  08/23/13 1440 Arlyss Queenammy Taz Vanness, RN BSN CM Pt discharged home today. No CM needs noted.  08/22/13 1300 Arlyss Queenammy Woodrow Dulski, RN BSN CM

## 2013-08-22 NOTE — Progress Notes (Signed)
Patient ID: [name]  male  WUJ:[id number]    DOB: [birth year]    DOA: 08/20/2013  PCP: Kirstie Peri, MD  Assessment/Plan: Principal Problem:   Acute on chronic systolic and diastolic CHF exacerbation with history of ischemic cardiomyopathy, coronary disease - 2-D echo reviewed EF 20-25% with dyskinesia of the mid to distal inferior septal myocardium, akinesis apical myocardium, hypokinesis basal mid inferior lateral and inferior myocardium, grade 2 diastolic dysfunction - Continue IV diuresis, cardiology consult called -Continue beta blocker, Lasix , Losartan  Active Problems:   CARDIOMYOPATHY, ISCHEMIC with chest pain - Cardiology consult called, ? Cardiac catheterization given echo results - Patient started on Toprol-XL - last cath in 2008 with patent stents, moderate non-obstructive disease    Atrial fibrillation - Currently on Toprol-XL, digoxin, amiodarone - Continue xarelto     Coronary artery disease - Per cardiology, no current chest pain   DVT Prophylaxis:On xarelto  Code Status:  Family Communication:  Disposition:    Subjective: Patient seen and examined, reports that his symptoms are improving with IV Lasix. No chest pain, fevers or chills, nausea vomiting    Objective: Weight change: -2.434 kg (-5 lb 5.9 oz)  Intake/Output Summary (Last 24 hours) at 08/22/13 1156 Last data filed at 08/22/13 1126  Gross per 24 hour  Intake    803 ml  Output   3650 ml  Net  -2847 ml   Blood pressure 104/73, pulse 94, temperature 97.4 F (36.3 C), temperature source Oral, resp. rate 22, height 5\' 7"  (1.702 m), weight 85.866 kg (189 lb 4.8 oz), SpO2 91.00%.  Physical Exam: General: Alert and awake, oriented x3, not in any acute distress. CVS: S1-S2 clear, no murmur rubs or gallops Chest: Decreased breath sound at the bases Abdomen: soft nontender, nondistended, normal bowel sounds  Extremities: no cyanosis, clubbing ,pedal edema noted bilaterally   Lab  Results: Basic Metabolic Panel:  Recent Labs Lab 08/21/13 0454 08/22/13 0511  NA 138 140  K 3.9 4.5  CL 92* 97  CO2 35* 35*  GLUCOSE 149* 117*  BUN 26* 29*  CREATININE 1.13 1.00  CALCIUM 9.1 9.2  MG  --  2.2   Liver Function Tests:  Recent Labs Lab 08/20/13 1537  AST 22  ALT 33  ALKPHOS 83  BILITOT 0.5  PROT 7.1  ALBUMIN 3.4*   No results found for this basename: LIPASE, AMYLASE,  in the last 168 hours No results found for this basename: AMMONIA,  in the last 168 hours CBC:  Recent Labs Lab 08/20/13 1537  WBC 12.2*  NEUTROABS 9.6*  HGB 15.2  HCT 47.3  MCV 99.0  PLT 204   Cardiac Enzymes:  Recent Labs Lab 08/20/13 1537  TROPONINI <0.30   BNP: No components found with this basename: POCBNP,  CBG: No results found for this basename: GLUCAP,  in the last 168 hours   Micro Results: Recent Results (from the past 240 hour(s))  MRSA PCR SCREENING     Status: None   Collection Time    08/20/13  6:36 PM      Result Value Range Status   MRSA by PCR NEGATIVE  NEGATIVE Final   Comment:            The GeneXpert MRSA Assay (FDA     approved for NASAL specimens     only), is one component of a     comprehensive MRSA colonization     surveillance program. It is not     intended  to diagnose MRSA     infection nor to guide or     monitor treatment for     MRSA infections.    Studies/Results: Dg Chest Portable 1 View  08/20/2013   CLINICAL DATA:  Shortness of breath.  EXAM: PORTABLE CHEST - 1 VIEW  COMPARISON:  10/07/2012.  FINDINGS: Cardiomegaly with pulmonary vascular prominence . Bilateral interstitial prominence with right pleural effusion noted. These findings are consistent with congestive heart failure. Cardiac pacer noted with lead tips in right atrium and right ventricle. No pneumothorax.  IMPRESSION: Congestive heart failure with pulmonary interstitial edema and right-sided pleural effusion. Underlying pneumonia, particularly in the right lower lobe  cannot be excluded .   Electronically Signed   By: Maisie Fushomas  Register   On: 08/20/2013 16:45    Medications: Scheduled Meds: . amiodarone  200 mg Oral Daily  . antiseptic oral rinse  15 mL Mouth Rinse BID  . budesonide-formoterol  2 puff Inhalation BID  . digoxin  0.125 mg Oral Daily  . furosemide  40 mg Intravenous Q12H  . levofloxacin  750 mg Oral Daily  . losartan  50 mg Oral Daily  . metoprolol succinate  12.5 mg Oral Daily  . pantoprazole  40 mg Oral Daily  . predniSONE  40 mg Oral QAC breakfast  . Rivaroxaban  20 mg Oral Daily  . sodium chloride  3 mL Intravenous Q12H      LOS: 2 days   RAI,RIPUDEEP M.D. [facility] 08/22/2013, 11:56 AM Pager: [phone]  If 7PM-7AM, please contact night-coverage [email or web address] Password [email or web address]

## 2013-08-23 DIAGNOSIS — I2510 Atherosclerotic heart disease of native coronary artery without angina pectoris: Secondary | ICD-10-CM

## 2013-08-23 LAB — BASIC METABOLIC PANEL
BUN: 38 mg/dL — AB (ref 6–23)
CHLORIDE: 96 meq/L (ref 96–112)
CO2: 34 meq/L — AB (ref 19–32)
Calcium: 9.7 mg/dL (ref 8.4–10.5)
Creatinine, Ser: 1.14 mg/dL (ref 0.50–1.35)
GFR, EST AFRICAN AMERICAN: 77 mL/min — AB (ref >90)
GFR, EST NON AFRICAN AMERICAN: 67 mL/min — AB (ref >90)
Glucose, Bld: 100 mg/dL — ABNORMAL HIGH (ref 70–99)
Potassium: 4.4 mEq/L (ref 3.7–5.3)
SODIUM: 142 meq/L (ref 137–147)

## 2013-08-23 MED ORDER — LEVOFLOXACIN 750 MG PO TABS: 750.0000 mg | ORAL_TABLET | Freq: Every day | ORAL | Status: DC

## 2013-08-23 MED ORDER — TORSEMIDE 20 MG PO TABS: 20.0000 mg | ORAL_TABLET | Freq: Two times a day (BID) | ORAL | Status: DC

## 2013-08-23 MED ORDER — METOPROLOL SUCCINATE 12.5 MG HALF TABLET
12.5000 mg | ORAL_TABLET | Freq: Every day | ORAL | Status: DC
Start: 2013-08-23 — End: 2013-11-09

## 2013-08-23 NOTE — Progress Notes (Signed)
Patient ID: [name], male   DOB: 10/01/1950, 63 y.o.   MRN: [id number]     Subjective:    Breathing much improved, ambulating halls this morning without difficulty.   Objective:   Temp:  [97.5 F (36.4 C)-97.8 F (36.6 C)] 97.8 F (36.6 C) (01/27 0228) Pulse Rate:  [89-101] 89 (01/27 0228) Resp:  [20] 20 (01/27 0228) BP: (112)/(75-77) 112/75 mmHg (01/27 0228) SpO2:  [91 %-95 %] 95 % (01/27 0712) Weight:  [183 lb 12.8 oz (83.371 kg)] 183 lb 12.8 oz (83.371 kg) (01/27 0500) Last BM Date: 08/22/13  Filed Weights   08/21/13 0500 08/22/13 0533 08/23/13 0500  Weight: 195 lb 15.8 oz (88.9 kg) 189 lb 4.8 oz (85.866 kg) 183 lb 12.8 oz (83.371 kg)    Intake/Output Summary (Last 24 hours) at 08/23/13 0821 Last data filed at 08/23/13 0553  Gross per 24 hour  Intake    523 ml  Output   1975 ml  Net  -1452 ml    Telemetry: afib, normal rates  Exam:  General:NAD  Resp: CTAB  Cardiac: irreg, rate 70, no m/r/g, no JVD  WG:NFAOZHY soft, NT, ND  MSK: extremities are warm, no edema  Neuro: no focal deficits   Lab Results:  Basic Metabolic Panel:  Recent Labs Lab 08/20/13 1537 08/20/13 2227 08/21/13 0454 08/22/13 0511 08/23/13 0521  NA 139  --  138 140 142  K 3.9  --  3.9 4.5 4.4  CL 93*  --  92* 97 96  CO2 40*  --  35* 35* 34*  GLUCOSE 90  --  149* 117* 100*  BUN 23  --  26* 29* 38*  CREATININE 1.14  --  1.13 1.00 1.14  CALCIUM 8.8  --  9.1 9.2 9.7  MG  --  2.0  --  2.2  --     Liver Function Tests:  Recent Labs Lab 08/20/13 1537  AST 22  ALT 33  ALKPHOS 83  BILITOT 0.5  PROT 7.1  ALBUMIN 3.4*    CBC:  Recent Labs Lab 08/20/13 1537  WBC 12.2*  HGB 15.2  HCT 47.3  MCV 99.0  PLT 204    Cardiac Enzymes:  Recent Labs Lab 08/20/13 1537  TROPONINI <0.30    BNP:  Recent Labs  08/20/13 1539  PROBNP 2069.0*    Coagulation: No results found for this basename: INR,  in the last 168 hours  ECG:   Medications:   Scheduled  Medications: . amiodarone  200 mg Oral Daily  . antiseptic oral rinse  15 mL Mouth Rinse BID  . budesonide-formoterol  2 puff Inhalation BID  . digoxin  0.125 mg Oral Daily  . furosemide  40 mg Intravenous Q12H  . levofloxacin  750 mg Oral Daily  . losartan  50 mg Oral Daily  . metoprolol succinate  12.5 mg Oral Daily  . pantoprazole  40 mg Oral Daily  . predniSONE  40 mg Oral QAC breakfast  . Rivaroxaban  20 mg Oral Daily  . sodium chloride  3 mL Intravenous Q12H     Infusions:     PRN Medications:  sodium chloride, acetaminophen, ipratropium, levalbuterol, nitroGLYCERIN, ondansetron (ZOFRAN) IV, sodium chloride     Assessment/Plan   1. ICM  - admitted with acute on chronic systolic heart failure, symptoms improving with diuresis - net negative 2.9 liters yesterday, negative 5.7 liters since admission. Cr remains stable.  - started on low dose Toprol XL, tolerated hemodynamically. Will  titrate further as outpatient.  - he previously had not been on beta blocker. From notes appears it was stopped sometime around 09/2012 but reason not indicated. He would also be a good candidate for spironolactone which can be added overtime during follow up.  - from cardiac standpoint ok for discharge today, recommend changing home diuretic to torsemide 20mg  bid. He needs to follow up with NP Lyman BishopLawrence in 2 weeks.   2. Chest pain  - reports 3 episodes of chest pain over the last few months, 10/10 sharp pain in mid chest which resolve with NG. Has not had symptoms recently.  - last cath in 2008 with patent stents, moderate non-obstructive disease  - he is on no antianginals at home, will start low dose Toprol XL in setting of CAD and systolic dysfunction.  - he is not on ASA because he is on xarelto   3. Afib  - no current palpitations, telemetry shows rate controlled afib  - continue amio and xarelto for now, he refused repeat DCCV previously  - follow up with Dr [name] as  outpatient         Dina RichJonathan Tiona Ruane, M.D., F.A.C.C.

## 2013-08-23 NOTE — Discharge Summary (Signed)
Physician Discharge Summary  [name] LemonsReuben S [name] ZOX:[id number] DOB: Dec 03, 1950 DOA: 08/20/2013  PCP: [name],ASHISH, MD  Admit date: 08/20/2013 Discharge date: 08/23/2013  Time spent: 30 minutes  Recommendations for Outpatient Follow-up:  1. Follow up with PCP in one week 2. [facility] in 2 weeks as recommended 3. Follow up with Dr [name] in 2 weeks.   Discharge Diagnoses:  Principal Problem:   Acute on chronic systolic CHF exacerbation  Active Problems:   CARDIOMYOPATHY, ISCHEMIC   Atrial fibrillation   HYPERLIPIDEMIA   Coronary artery disease bronchitis  Discharge Condition: improved.   Diet recommendation: low sodium diet  Filed Weights   08/21/13 0500 08/22/13 0533 08/23/13 0500  Weight: 88.9 kg (195 lb 15.8 oz) 85.866 kg (189 lb 4.8 oz) 83.371 kg (183 lb 12.8 oz)    History of present illness:   [name] LemonsReuben S [name] is a 63 y.o. male with prior h/o ischemic cadiomyopathy, hypertension, copd, active smoker, reports compliant to medications, came in for worsening sob and pedal edema, mild orthopnea,. He was found to be slightly hypoxic, hypotensive. His CXR Revealed CHF, with interstitial edema. Pro bnp elevated. Labs revealed mild leukocytosis. He denies fever or chills. He also reports loose stools for 1 days. He is referred to hospitalist service for admission for CHF exacerbation.   Hospital Course:  Acute on chronic systolic and diastolic CHF exacerbation with history of ischemic cardiomyopathy, coronary disease  - 2-D echo reviewed EF 20-25% with dyskinesia of the mid to distal inferior septal myocardium, akinesis apical myocardium, hypokinesis basal mid inferior lateral and inferior myocardium, grade 2 diastolic dysfunction  Started on  IV lasix, cardiology consult called. I/O last 3 completed shifts: In: 523 [P.O.:520; I.V.:3] Out: 3975 [Urine:3975] Total I/O In: 240 [P.O.:240] Out: 900 [Urine:900]    -Continue beta blocker, Lasix ,  Losartan   CARDIOMYOPATHY, ISCHEMIC with chest pain  - Cardiology consult called, and appreciate recommendations.  - Patient started on Toprol-XL  - last cath in 2008 with patent stents, moderate non-obstructive disease  Atrial fibrillation  - Currently on Toprol-XL, digoxin, amiodarone  - Continue xarelto  Coronary artery disease  - Per cardiology, no current chest pain  Bronchitis; recommended to stop smoking.  Counseling given.  levaquin for bronchitis.   Procedures:  echocardiogram  Consultations:  cardiology  Discharge Exam: Filed Vitals:   08/23/13 0228  BP: 112/75  Pulse: 89  Temp: 97.8 F (36.6 C)  Resp: 20   General: Alert and awake, oriented x3, not in any acute distress.  CVS: S1-S2 clear, no murmur rubs or gallops  Chest: Decreased breath sound at the bases  Abdomen: soft nontender, nondistended, normal bowel sounds  Extremities: no cyanosis, clubbing ,pedal edema noted bilaterally    Discharge Instructions      Discharge Orders   Future Appointments Provider Department Dept Phone   09/16/2013 11:15 AM Marinus MawGregg W Taylor, MD [facility] [phone]   Future Orders Complete By Expires   (HEART FAILURE PATIENTS) Call MD:  Anytime you have any of the following symptoms: 1) 3 pound weight gain in 24 hours or 5 pounds in 1 week 2) shortness of breath, with or without a dry hacking cough 3) swelling in the hands, feet or stomach 4) if you have to sleep on extra pillows at night in order to breathe.  As directed    Diet - low sodium heart healthy  As directed    Discharge instructions  As directed  Comments:     Follow up with cardiology as recommended.       Medication List    STOP taking these medications       furosemide 40 MG tablet  Commonly known as:  LASIX      TAKE these medications       amiodarone 200 MG tablet  Commonly known as:  PACERONE  Take 200 mg by mouth daily.     budesonide-formoterol 160-4.5 MCG/ACT inhaler   Commonly known as:  SYMBICORT  Inhale 2 puffs into the lungs 2 (two) times daily.     digoxin 0.125 MG tablet  Commonly known as:  LANOXIN  Take 1 tablet (0.125 mg total) by mouth daily.     levofloxacin 750 MG tablet  Commonly known as:  LEVAQUIN  Take 1 tablet (750 mg total) by mouth daily.     losartan 50 MG tablet  Commonly known as:  COZAAR  Take 1 tablet (50 mg total) by mouth daily.     metoprolol succinate 12.5 mg Tb24 24 hr tablet  Commonly known as:  TOPROL-XL  Take 0.5 tablets (12.5 mg total) by mouth daily.     nitroGLYCERIN 0.4 MG SL tablet  Commonly known as:  NITROSTAT  Place 1 tablet (0.4 mg total) under the tongue every 5 (five) minutes as needed for chest pain.     omeprazole 20 MG capsule  Commonly known as:  PRILOSEC  Take 20 mg by mouth 2 (two) times daily.     Rivaroxaban 20 MG Tabs tablet  Commonly known as:  XARELTO  Take 1 tablet (20 mg total) by mouth daily.     torsemide 20 MG tablet  Commonly known as:  DEMADEX  Take 1 tablet (20 mg total) by mouth 2 (two) times daily.       No Known Allergies Follow-up Information   Follow up with [facility], MD. Schedule an appointment as soon as possible for a visit in 1 week.   Specialty:  Internal Medicine   Contact information:   [address]  [place] [address] [phone]       Follow up with Joni Reining, NP. Schedule an appointment as soon as possible for a visit in 2 weeks.   Specialty:  Nurse Practitioner   Contact information:   [address] [place] [address] [phone]       Follow up with Lewayne Bunting, MD. Schedule an appointment as soon as possible for a visit in 2 weeks.   Specialty:  Cardiology   Contact information:   [address] [place] [address] [phone]        The results of significant diagnostics from this hospitalization (including imaging, microbiology, ancillary and laboratory) are listed below for reference.     Significant Diagnostic Studies: Dg Chest Portable 1 View  08/20/2013   CLINICAL DATA:  Shortness of breath.  EXAM: PORTABLE CHEST - 1 VIEW  COMPARISON:  10/07/2012.  FINDINGS: Cardiomegaly with pulmonary vascular prominence . Bilateral interstitial prominence with right pleural effusion noted. These findings are consistent with congestive heart failure. Cardiac pacer noted with lead tips in right atrium and right ventricle. No pneumothorax.  IMPRESSION: Congestive heart failure with pulmonary interstitial edema and right-sided pleural effusion. Underlying pneumonia, particularly in the right lower lobe cannot be excluded .   Electronically Signed   By: Maisie Fus  Register   On: 08/20/2013 16:45    Microbiology: Recent Results (from the past 240 hour(s))  MRSA PCR SCREENING     Status: None   Collection Time    08/20/13  6:36 PM      Result Value Range Status   MRSA by PCR NEGATIVE  NEGATIVE Final   Comment:            The GeneXpert MRSA Assay (FDA     approved for NASAL specimens     only), is one component of a     comprehensive MRSA colonization     surveillance program. It is not     intended to diagnose MRSA     infection nor to guide or     monitor treatment for     MRSA infections.     Labs: Basic Metabolic Panel:  Recent Labs Lab 08/20/13 1537 08/20/13 2227 08/21/13 0454 08/22/13 0511 08/23/13 0521  NA 139  --  138 140 142  K 3.9  --  3.9 4.5 4.4  CL 93*  --  92* 97 96  CO2 40*  --  35* 35* 34*  GLUCOSE 90  --  149* 117* 100*  BUN 23  --  26* 29* 38*  CREATININE 1.14  --  1.13 1.00 1.14  CALCIUM 8.8  --  9.1 9.2 9.7  MG  --  2.0  --  2.2  --    Liver Function Tests:  Recent Labs Lab 08/20/13 1537  AST 22  ALT 33  ALKPHOS 83  BILITOT 0.5  PROT 7.1  ALBUMIN 3.4*   No results found for this basename: LIPASE, AMYLASE,  in the last 168 hours No results found for this basename: AMMONIA,  in the last 168 hours CBC:  Recent Labs Lab 08/20/13 1537  WBC  12.2*  NEUTROABS 9.6*  HGB 15.2  HCT 47.3  MCV 99.0  PLT 204   Cardiac Enzymes:  Recent Labs Lab 08/20/13 1537  TROPONINI <0.30   BNP: BNP (last 3 results)  Recent Labs  08/20/13 1539  PROBNP 2069.0*   CBG: No results found for this basename: GLUCAP,  in the last 168 hours     Signed:  Annasofia Pohl  [facility] 08/23/2013, 2:31 PM

## 2013-09-07 ENCOUNTER — Encounter: Payer: Self-pay | Admitting: Internal Medicine

## 2013-09-07 ENCOUNTER — Ambulatory Visit (INDEPENDENT_AMBULATORY_CARE_PROVIDER_SITE_OTHER): Payer: Medicare Other | Admitting: Internal Medicine

## 2013-09-07 VITALS — BP 99/61 | HR 93 | Ht 67.0 in | Wt 196.0 lb

## 2013-09-07 DIAGNOSIS — I5022 Chronic systolic (congestive) heart failure: Secondary | ICD-10-CM

## 2013-09-07 DIAGNOSIS — R05 Cough: Secondary | ICD-10-CM

## 2013-09-07 DIAGNOSIS — I4891 Unspecified atrial fibrillation: Secondary | ICD-10-CM

## 2013-09-07 DIAGNOSIS — R0602 Shortness of breath: Secondary | ICD-10-CM

## 2013-09-07 DIAGNOSIS — R059 Cough, unspecified: Secondary | ICD-10-CM

## 2013-09-07 DIAGNOSIS — I2589 Other forms of chronic ischemic heart disease: Secondary | ICD-10-CM

## 2013-09-07 LAB — MDC_IDC_ENUM_SESS_TYPE_INCLINIC
Battery Voltage: 2.56 V
Brady Statistic RA Percent Paced: 0 %
Brady Statistic RV Percent Paced: 9.4 %
Date Time Interrogation Session: 20150211103528
HighPow Impedance: 63 Ohm
Implantable Pulse Generator Serial Number: 454266
Lead Channel Impedance Value: 350 Ohm
Lead Channel Pacing Threshold Amplitude: 1.5 V
Lead Channel Sensing Intrinsic Amplitude: 11.5 mV
Lead Channel Sensing Intrinsic Amplitude: 3.1 mV
Lead Channel Setting Pacing Amplitude: 2.5 V
Lead Channel Setting Sensing Sensitivity: 0.3 mV
MDC IDC MSMT BATTERY REMAINING LONGEVITY: 25.2 mo
MDC IDC MSMT LEADCHNL RA IMPEDANCE VALUE: 375 Ohm
MDC IDC MSMT LEADCHNL RV PACING THRESHOLD PULSEWIDTH: 1 ms
MDC IDC SET LEADCHNL RA PACING AMPLITUDE: 2 V
MDC IDC SET LEADCHNL RV PACING PULSEWIDTH: 1 ms
MDC IDC SET ZONE DETECTION INTERVAL: 280 ms

## 2013-09-07 NOTE — Assessment & Plan Note (Signed)
His symptoms are class 2. He will continue his current medical therapy. I encouraged him to maintain a low sodium diet.

## 2013-09-07 NOTE — Patient Instructions (Addendum)
Your physician recommends that you schedule a follow-up appointment in: 3 months with Gunnar FusiPaula and 12 months with Dr [name].  You will receive a reminder letter two months in advance reminding you to call and schedule your appointment. If you don't receive this letter, please contact our office.  Your physician has recommended you make the following change in your medication:  STOP AMIODARONE   You have been referred to Dr [name]

## 2013-09-07 NOTE — Assessment & Plan Note (Signed)
He denies anginal symptoms. He remains fairly sedentary.

## 2013-09-07 NOTE — Assessment & Plan Note (Signed)
He is unable to maintain NSR. He will discontinue his amiodarone and undergo a strategy of rate control.

## 2013-09-07 NOTE — Progress Notes (Signed)
HPI [name] returns today for followup. He is a very pleasant 63 year old man with persistent atrial fibrillation.He has developed chronic atrial fibrillation and will not maintain NSR despite amiodarone and DCCV. He also c/o a cough and would like to see a lung specialist. He denies chest pain, syncope, or ICD shock.                                                   No Known Allergies   Current Outpatient Prescriptions  Medication Sig Dispense Refill  . budesonide-formoterol (SYMBICORT) 160-4.5 MCG/ACT inhaler Inhale 2 puffs into the lungs 2 (two) times daily.      . digoxin (LANOXIN) 0.125 MG tablet Take 1 tablet (0.125 mg total) by mouth daily.  90 tablet  3  . levofloxacin (LEVAQUIN) 750 MG tablet Take 1 tablet (750 mg total) by mouth daily.  4 tablet  0  . losartan (COZAAR) 50 MG tablet Take 1 tablet (50 mg total) by mouth daily.  90 tablet  0  . metoprolol succinate (TOPROL-XL) 12.5 mg TB24 24 hr tablet Take 0.5 tablets (12.5 mg total) by mouth daily.  30 tablet  1  . nitroGLYCERIN (NITROSTAT) 0.4 MG SL tablet Place 1 tablet (0.4 mg total) under the tongue every 5 (five) minutes as needed for chest pain.  25 tablet  2  . omeprazole (PRILOSEC) 20 MG capsule Take 20 mg by mouth 2 (two) times daily.        . Rivaroxaban (XARELTO) 20 MG TABS tablet Take 1 tablet (20 mg total) by mouth daily.  30 tablet  3  . torsemide (DEMADEX) 20 MG tablet Take 1 tablet (20 mg total) by mouth 2 (two) times daily.  60 tablet  1   No current facility-administered medications for this visit.     Past Medical History  Diagnosis Date  . Ischemic cardiomyopathy     cardiomyopathy ischemic  . Paroxysmal atrial fibrillation   . Renal calculi   . 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 Chronic systolic dysfunction of left ventricle   . Coronary artery disease   . Chronic pain     chronic left abdominal pain    ROS:   All systems reviewed and negative except as noted in  the HPI.   Past Surgical History  Procedure Laterality Date  . Difibrillator insertion  12/31/06    SJM Current DR RF ICD implanted by Dr [name]     Family History  Problem Relation Age of Onset  . Coronary artery disease Other     family history of CAD     History   Social History  . Marital Status: Married    Spouse Name: N/A    Number of Children: N/A  . Years of Education: N/A   Occupational History  . RETIRED    Social History Main Topics  . Smoking status: Current Some Day Smoker -- 0.50 packs/day for 40 years    Types: Cigarettes  . Smokeless tobacco: Current User     Comment: he is not willing to quit-notes he smokes 3-4 cig's at least every other day 04-01-13  . Alcohol Use: No  . Drug Use: No  . Sexual Activity: No   Other Topics Concern  . Not on file   Social History Narrative  DISABLED ,DIVORCED     BP 99/61  Pulse 93  Ht 5\' 7"  (1.702 m)  Wt 196 lb (88.905 kg)  BMI 30.69 kg/m2  Physical Exam:  stable appearing middle-aged man, NAD HEENT: Unremarkable Neck:  6 cm JVD, no thyromegally Back:  No CVA tenderness Lungs:  Clear with no wheezes or rhonchi, scattered rales are present. HEART:  Regular rate rhythm, no murmurs, no rubs, no clicks Abd:  soft, positive bowel sounds, no organomegally, no rebound, no guarding Ext:  2 plus pulses, no edema, no cyanosis, no clubbing Skin:  No rashes no nodules Neuro:  CN II through XII intact, motor grossly intact   DEVICE  Normal device function.  See PaceArt for details.   Assess/Plan:

## 2013-09-16 ENCOUNTER — Encounter: Payer: Medicare Other | Admitting: Internal Medicine

## 2013-09-20 ENCOUNTER — Telehealth: Payer: Self-pay

## 2013-09-20 NOTE — Telephone Encounter (Signed)
Left message to return call.  Refill requesting Furosemide.  Chart looks like med has been changed to Torsemide.  Need to clarify with patient.

## 2013-09-23 NOTE — Telephone Encounter (Signed)
Spoke with patient.  Confirmed no longer on Furosemide.  [facility] [place].  Patient aware.

## 2013-10-26 ENCOUNTER — Telehealth: Payer: Self-pay | Admitting: *Deleted

## 2013-10-26 ENCOUNTER — Other Ambulatory Visit (HOSPITAL_COMMUNITY): Payer: Self-pay

## 2013-10-26 DIAGNOSIS — R05 Cough: Secondary | ICD-10-CM

## 2013-10-26 DIAGNOSIS — R55 Syncope and collapse: Secondary | ICD-10-CM

## 2013-10-26 DIAGNOSIS — J441 Chronic obstructive pulmonary disease with (acute) exacerbation: Secondary | ICD-10-CM

## 2013-10-26 LAB — PULMONARY FUNCTION TEST
DL/VA % pred: 83 %
DL/VA: 3.71 ml/min/mmHg/L
DLCO UNC: 16.49 ml/min/mmHg
DLCO cor % pred: 55 %
DLCO cor: 15.82 ml/min/mmHg
DLCO unc % pred: 58 %
FEF 25-75 POST: 1.61 L/s
FEF 25-75 Pre: 1.05 L/sec
FEF2575-%CHANGE-POST: 53 %
FEF2575-%PRED-PRE: 42 %
FEF2575-%Pred-Post: 64 %
FEV1-%Change-Post: 13 %
FEV1-%PRED-PRE: 47 %
FEV1-%Pred-Post: 53 %
FEV1-PRE: 1.47 L
FEV1-Post: 1.67 L
FEV1FVC-%Change-Post: -5 %
FEV1FVC-%PRED-PRE: 96 %
FEV6-%CHANGE-POST: 20 %
FEV6-%PRED-POST: 61 %
FEV6-%Pred-Pre: 51 %
FEV6-Post: 2.43 L
FEV6-Pre: 2.02 L
FEV6FVC-%Pred-Post: 105 %
FEV6FVC-%Pred-Pre: 105 %
FVC-%CHANGE-POST: 19 %
FVC-%PRED-POST: 58 %
FVC-%Pred-Pre: 49 %
FVC-Post: 2.43 L
FVC-Pre: 2.03 L
POST FEV1/FVC RATIO: 69 %
POST FEV6/FVC RATIO: 100 %
Pre FEV1/FVC ratio: 72 %
Pre FEV6/FVC Ratio: 100 %
RV % pred: 161 %
RV: 3.48 L
TLC % pred: 86 %
TLC: 5.51 L

## 2013-10-26 MED ORDER — LOSARTAN POTASSIUM 50 MG PO TABS: 50.0000 mg | ORAL_TABLET | Freq: Every day | ORAL | Status: DC

## 2013-10-26 MED ORDER — OMEPRAZOLE 20 MG PO CPDR: 20.0000 mg | DELAYED_RELEASE_CAPSULE | Freq: Two times a day (BID) | ORAL | Status: DC

## 2013-10-26 NOTE — Telephone Encounter (Signed)
Pt no longer on Furosemide. [facility] Medication sent via escribe for Pepcid

## 2013-10-26 NOTE — Telephone Encounter (Signed)
Losartan 50 mg #90 

## 2013-10-26 NOTE — Telephone Encounter (Signed)
Medication sent via escribe.  

## 2013-10-26 NOTE — Telephone Encounter (Signed)
[facility] [place] [phone] F [phone]  PEPCID 20 MG #28 RX# [id number] FUROSEMIDE  40 MG # 60 RX # [id number]

## 2013-10-31 ENCOUNTER — Telehealth: Payer: Self-pay | Admitting: Internal Medicine

## 2013-10-31 NOTE — Telephone Encounter (Signed)
Lasix d/c'd 08/23/13  Started on demadex 20 mg bid  [facility] made aware

## 2013-10-31 NOTE — Telephone Encounter (Signed)
Received fax refill request  Rx # [id number] Medication:  Furosemide 40 mg tab Qty 60 Sig:  Take one tablet by mouth twice daily Physician:  [name]

## 2013-11-03 ENCOUNTER — Ambulatory Visit (HOSPITAL_COMMUNITY)
Admission: RE | Admit: 2013-11-03 | Discharge: 2013-11-03 | Disposition: A | Discharge status: Home / Self Care | Payer: Medicare Other | Source: Ambulatory Visit | Attending: Pulmonary Disease | Admitting: Pulmonary Disease

## 2013-11-03 DIAGNOSIS — R059 Cough, unspecified: Secondary | ICD-10-CM | POA: Insufficient documentation

## 2013-11-03 DIAGNOSIS — R062 Wheezing: Secondary | ICD-10-CM | POA: Insufficient documentation

## 2013-11-03 DIAGNOSIS — R05 Cough: Secondary | ICD-10-CM | POA: Insufficient documentation

## 2013-11-03 DIAGNOSIS — R0989 Other specified symptoms and signs involving the circulatory and respiratory systems: Secondary | ICD-10-CM | POA: Insufficient documentation

## 2013-11-03 DIAGNOSIS — R0609 Other forms of dyspnea: Secondary | ICD-10-CM | POA: Insufficient documentation

## 2013-11-03 LAB — BLOOD GAS, ARTERIAL
Acid-Base Excess: 3.5 mmol/L — ABNORMAL HIGH (ref 0.0–2.0)
Bicarbonate: 27.4 mEq/L — ABNORMAL HIGH (ref 20.0–24.0)
O2 Saturation: 96.8 %
PH ART: 7.444 (ref 7.350–7.450)
Patient temperature: 37
TCO2: 23.1 mmol/L (ref 0–100)
pCO2 arterial: 40.5 mmHg (ref 35.0–45.0)
pO2, Arterial: 82.9 mmHg (ref 80.0–100.0)

## 2013-11-03 MED ORDER — ALBUTEROL SULFATE (2.5 MG/3ML) 0.083% IN NEBU
2.5000 mg | INHALATION_SOLUTION | Freq: Once | RESPIRATORY_TRACT | Status: AC
  Administered 2013-11-03: 2.5 mg via RESPIRATORY_TRACT

## 2013-11-09 ENCOUNTER — Other Ambulatory Visit: Payer: Self-pay | Admitting: Cardiology

## 2013-11-09 MED ORDER — METOPROLOL SUCCINATE 12.5 MG HALF TABLET: 12.5000 mg | ORAL_TABLET | Freq: Every day | ORAL | Status: DC

## 2013-11-11 ENCOUNTER — Telehealth: Payer: Self-pay | Admitting: *Deleted

## 2013-11-11 DIAGNOSIS — R55 Syncope and collapse: Secondary | ICD-10-CM

## 2013-11-11 DIAGNOSIS — R05 Cough: Secondary | ICD-10-CM

## 2013-11-11 DIAGNOSIS — I4891 Unspecified atrial fibrillation: Secondary | ICD-10-CM

## 2013-11-11 MED ORDER — METOPROLOL SUCCINATE ER 25 MG PO TB24: 12.5000 mg | ORAL_TABLET | Freq: Every day | ORAL | Status: DC

## 2013-11-11 MED ORDER — TORSEMIDE 20 MG PO TABS: 20.0000 mg | ORAL_TABLET | Freq: Two times a day (BID) | ORAL | Status: DC

## 2013-11-11 MED ORDER — LOSARTAN POTASSIUM 50 MG PO TABS: 50.0000 mg | ORAL_TABLET | Freq: Every day | ORAL | Status: DC

## 2013-11-11 MED ORDER — METOPROLOL SUCCINATE 12.5 MG HALF TABLET: 12.5000 mg | ORAL_TABLET | Freq: Every day | ORAL | Status: DC

## 2013-11-11 MED ORDER — DIGOXIN 125 MCG PO TABS: 0.1250 mg | ORAL_TABLET | Freq: Every day | ORAL | Status: DC

## 2013-11-11 MED ORDER — RIVAROXABAN 20 MG PO TABS: 20.0000 mg | ORAL_TABLET | Freq: Every day | ORAL | Status: DC

## 2013-11-11 NOTE — Telephone Encounter (Signed)
Pt needs refills on medications sent to [facility] in [place], metoprolol was already sent on 11/09/13 but they say they have not got it. Pt also states that he need 4 other Rx called in but he is not sure of what they are or if he even takes them anymore.  He mentioned turosimide also.  Please call pt and go over medications with him. Thanks

## 2013-11-11 NOTE — Addendum Note (Signed)
Addended by: Thompson GrayerURHAM, Hazel Wrinkle C on: 11/11/2013 01:49 PM   Modules accepted: Orders

## 2013-11-11 NOTE — Telephone Encounter (Signed)
Went over medications with Pt  Medication sent via escribe.

## 2014-01-09 ENCOUNTER — Encounter: Payer: Self-pay | Admitting: *Deleted

## 2014-02-15 ENCOUNTER — Encounter: Payer: Self-pay | Admitting: *Deleted

## 2014-03-30 ENCOUNTER — Telehealth: Payer: Self-pay | Admitting: Cardiology

## 2014-03-30 ENCOUNTER — Other Ambulatory Visit: Payer: Self-pay | Admitting: Cardiology

## 2014-03-30 DIAGNOSIS — I480 Paroxysmal atrial fibrillation: Secondary | ICD-10-CM

## 2014-03-30 MED ORDER — RIVAROXABAN 20 MG PO TABS: 20.0000 mg | ORAL_TABLET | Freq: Every day | ORAL | Status: DC

## 2014-03-30 NOTE — Telephone Encounter (Signed)
i refilled xarelto 20 mg 1 daily #30, 6 refills

## 2014-03-31 ENCOUNTER — Telehealth: Payer: Self-pay | Admitting: Internal Medicine

## 2014-03-31 DIAGNOSIS — I480 Paroxysmal atrial fibrillation: Secondary | ICD-10-CM

## 2014-03-31 MED ORDER — RIVAROXABAN 20 MG PO TABS
20.0000 mg | ORAL_TABLET | Freq: Every day | ORAL | Status: DC
Start: 2014-03-31 — End: 2014-09-22

## 2014-03-31 NOTE — Telephone Encounter (Signed)
Received fax refill request  Rx # [id number]  Medication:  Xarelto 20 mg tab Qty 30 Sig:  Take one tablet by mouth once daily Physician:  Ladona Ridgel

## 2014-04-26 ENCOUNTER — Encounter: Payer: Self-pay | Admitting: *Deleted

## 2014-05-05 ENCOUNTER — Telehealth: Payer: Self-pay | Admitting: Cardiology

## 2014-05-05 NOTE — Telephone Encounter (Signed)
Pt received delinquent ICD check letter. He stated [facility] and wanted to know why he got the letter. I informed him that he has to have ICD checked every 3 months either in the office or at home. Pt stated that he remembers having a home monitor but he had a stroke and his daughter took care of him for a while but he does not know what happened to the home monitor. Pt agreed to an appt with device clinic on 06-02-2014 at 10:20 AM.

## 2014-05-24 ENCOUNTER — Emergency Department (HOSPITAL_COMMUNITY): Payer: Medicare Other

## 2014-05-24 ENCOUNTER — Inpatient Hospital Stay (HOSPITAL_COMMUNITY)
Admission: EM | Admit: 2014-05-24 | Discharge: 2014-06-02 | DRG: 291 | Disposition: A | Discharge status: Home / Self Care | Payer: Medicare Other | Attending: Internal Medicine | Admitting: Internal Medicine

## 2014-05-24 ENCOUNTER — Encounter (HOSPITAL_COMMUNITY): Payer: Self-pay | Admitting: Emergency Medicine

## 2014-05-24 DIAGNOSIS — I7100 Dissection of unspecified site of aorta: Secondary | ICD-10-CM

## 2014-05-24 DIAGNOSIS — Z9981 Dependence on supplemental oxygen: Secondary | ICD-10-CM

## 2014-05-24 DIAGNOSIS — M549 Dorsalgia, unspecified: Secondary | ICD-10-CM

## 2014-05-24 DIAGNOSIS — J9620 Acute and chronic respiratory failure, unspecified whether with hypoxia or hypercapnia: Secondary | ICD-10-CM | POA: Diagnosis present

## 2014-05-24 DIAGNOSIS — R911 Solitary pulmonary nodule: Secondary | ICD-10-CM

## 2014-05-24 DIAGNOSIS — I5043 Acute on chronic combined systolic (congestive) and diastolic (congestive) heart failure: Secondary | ICD-10-CM

## 2014-05-24 DIAGNOSIS — R109 Unspecified abdominal pain: Secondary | ICD-10-CM

## 2014-05-24 DIAGNOSIS — Z7901 Long term (current) use of anticoagulants: Secondary | ICD-10-CM | POA: Diagnosis not present

## 2014-05-24 DIAGNOSIS — I255 Ischemic cardiomyopathy: Secondary | ICD-10-CM

## 2014-05-24 DIAGNOSIS — I951 Orthostatic hypotension: Secondary | ICD-10-CM | POA: Diagnosis present

## 2014-05-24 DIAGNOSIS — R0602 Shortness of breath: Secondary | ICD-10-CM

## 2014-05-24 DIAGNOSIS — Z79899 Other long term (current) drug therapy: Secondary | ICD-10-CM

## 2014-05-24 DIAGNOSIS — I480 Paroxysmal atrial fibrillation: Secondary | ICD-10-CM | POA: Insufficient documentation

## 2014-05-24 DIAGNOSIS — J441 Chronic obstructive pulmonary disease with (acute) exacerbation: Secondary | ICD-10-CM | POA: Diagnosis present

## 2014-05-24 DIAGNOSIS — Z95810 Presence of automatic (implantable) cardiac defibrillator: Secondary | ICD-10-CM

## 2014-05-24 DIAGNOSIS — I2510 Atherosclerotic heart disease of native coronary artery without angina pectoris: Secondary | ICD-10-CM | POA: Diagnosis present

## 2014-05-24 DIAGNOSIS — J939 Pneumothorax, unspecified: Secondary | ICD-10-CM | POA: Diagnosis present

## 2014-05-24 DIAGNOSIS — I959 Hypotension, unspecified: Secondary | ICD-10-CM

## 2014-05-24 DIAGNOSIS — E875 Hyperkalemia: Secondary | ICD-10-CM | POA: Diagnosis present

## 2014-05-24 DIAGNOSIS — J9811 Atelectasis: Secondary | ICD-10-CM | POA: Diagnosis present

## 2014-05-24 DIAGNOSIS — Z7951 Long term (current) use of inhaled steroids: Secondary | ICD-10-CM

## 2014-05-24 DIAGNOSIS — I472 Ventricular tachycardia: Secondary | ICD-10-CM | POA: Diagnosis present

## 2014-05-24 DIAGNOSIS — Z9289 Personal history of other medical treatment: Secondary | ICD-10-CM

## 2014-05-24 DIAGNOSIS — I509 Heart failure, unspecified: Secondary | ICD-10-CM

## 2014-05-24 DIAGNOSIS — A419 Sepsis, unspecified organism: Secondary | ICD-10-CM

## 2014-05-24 DIAGNOSIS — F17210 Nicotine dependence, cigarettes, uncomplicated: Secondary | ICD-10-CM | POA: Diagnosis present

## 2014-05-24 DIAGNOSIS — I4891 Unspecified atrial fibrillation: Secondary | ICD-10-CM

## 2014-05-24 DIAGNOSIS — I2583 Coronary atherosclerosis due to lipid rich plaque: Secondary | ICD-10-CM

## 2014-05-24 DIAGNOSIS — J90 Pleural effusion, not elsewhere classified: Secondary | ICD-10-CM

## 2014-05-24 DIAGNOSIS — J189 Pneumonia, unspecified organism: Secondary | ICD-10-CM

## 2014-05-24 DIAGNOSIS — I5023 Acute on chronic systolic (congestive) heart failure: Secondary | ICD-10-CM

## 2014-05-24 HISTORY — DX: Presence of automatic (implantable) cardiac defibrillator: Z95.810

## 2014-05-24 LAB — BASIC METABOLIC PANEL
Anion gap: 8 (ref 5–15)
BUN: 25 mg/dL — AB (ref 6–23)
CALCIUM: 9.5 mg/dL (ref 8.4–10.5)
CO2: 36 meq/L — AB (ref 19–32)
CREATININE: 0.83 mg/dL (ref 0.50–1.35)
Chloride: 100 mEq/L (ref 96–112)
GFR calc Af Amer: >90 mL/min (ref >90)
GLUCOSE: 114 mg/dL — AB (ref 70–99)
Potassium: 4.4 mEq/L (ref 3.7–5.3)
Sodium: 144 mEq/L (ref 137–147)

## 2014-05-24 LAB — CBC WITH DIFFERENTIAL/PLATELET
Basophils Absolute: 0 10*3/uL (ref 0.0–0.1)
Basophils Relative: 0 % (ref 0–1)
EOS PCT: 3 % (ref 0–5)
Eosinophils Absolute: 0.3 10*3/uL (ref 0.0–0.7)
HEMATOCRIT: 49 % (ref 39.0–52.0)
Hemoglobin: 15.8 g/dL (ref 13.0–17.0)
Lymphocytes Relative: 8 % — ABNORMAL LOW (ref 12–46)
Lymphs Abs: 1 10*3/uL (ref 0.7–4.0)
MCH: 31.9 pg (ref 26.0–34.0)
MCHC: 32.2 g/dL (ref 30.0–36.0)
MCV: 99 fL (ref 78.0–100.0)
Monocytes Absolute: 1.1 10*3/uL — ABNORMAL HIGH (ref 0.1–1.0)
Monocytes Relative: 9 % (ref 3–12)
Neutro Abs: 9.7 10*3/uL — ABNORMAL HIGH (ref 1.7–7.7)
Neutrophils Relative %: 80 % — ABNORMAL HIGH (ref 43–77)
PLATELETS: 209 10*3/uL (ref 150–400)
RBC: 4.95 MIL/uL (ref 4.22–5.81)
RDW: 14 % (ref 11.5–15.5)
WBC: 12.1 10*3/uL — ABNORMAL HIGH (ref 4.0–10.5)

## 2014-05-24 LAB — CBC
HEMATOCRIT: 49.1 % (ref 39.0–52.0)
Hemoglobin: 15.6 g/dL (ref 13.0–17.0)
MCH: 32.2 pg (ref 26.0–34.0)
MCHC: 31.8 g/dL (ref 30.0–36.0)
MCV: 101.2 fL — AB (ref 78.0–100.0)
Platelets: 207 10*3/uL (ref 150–400)
RBC: 4.85 MIL/uL (ref 4.22–5.81)
RDW: 14.1 % (ref 11.5–15.5)
WBC: 12.9 10*3/uL — AB (ref 4.0–10.5)

## 2014-05-24 LAB — DIGOXIN LEVEL: Digoxin Level: 1 ng/mL (ref 0.8–2.0)

## 2014-05-24 LAB — TROPONIN I: Troponin I: <0.3 ng/mL (ref <0.30)

## 2014-05-24 LAB — LACTIC ACID, PLASMA: LACTIC ACID, VENOUS: 1.2 mmol/L (ref 0.5–2.2)

## 2014-05-24 LAB — CREATININE, SERUM
Creatinine, Ser: 1.05 mg/dL (ref 0.50–1.35)
GFR calc Af Amer: 85 mL/min — ABNORMAL LOW (ref >90)
GFR calc non Af Amer: 73 mL/min — ABNORMAL LOW (ref >90)

## 2014-05-24 LAB — MRSA PCR SCREENING: MRSA BY PCR: NEGATIVE

## 2014-05-24 LAB — PRO B NATRIURETIC PEPTIDE: Pro B Natriuretic peptide (BNP): 2785 pg/mL — ABNORMAL HIGH (ref 0–125)

## 2014-05-24 MED ORDER — DEXTROSE 5 % IV SOLN
500.0000 mg | Freq: Once | INTRAVENOUS | Status: AC
  Administered 2014-05-24: 500 mg via INTRAVENOUS
  Filled 2014-05-24: qty 500

## 2014-05-24 MED ORDER — SODIUM CHLORIDE 0.9 % IV SOLN: 250.0000 mL | INTRAVENOUS | Status: DC | PRN

## 2014-05-24 MED ORDER — LEVALBUTEROL HCL 0.63 MG/3ML IN NEBU
0.6300 mg | INHALATION_SOLUTION | RESPIRATORY_TRACT | Status: DC
  Administered 2014-05-24 – 2014-05-27 (×15): 0.63 mg via RESPIRATORY_TRACT
  Filled 2014-05-24 (×32): qty 3

## 2014-05-24 MED ORDER — IPRATROPIUM BROMIDE 0.02 % IN SOLN
0.5000 mg | RESPIRATORY_TRACT | Status: DC
  Administered 2014-05-24 – 2014-05-27 (×16): 0.5 mg via RESPIRATORY_TRACT
  Filled 2014-05-24 (×16): qty 2.5

## 2014-05-24 MED ORDER — SODIUM CHLORIDE 0.9 % IV SOLN
1.0000 g | Freq: Once | INTRAVENOUS | Status: AC
  Administered 2014-05-24: 1 g via INTRAVENOUS
  Filled 2014-05-24: qty 10

## 2014-05-24 MED ORDER — SODIUM CHLORIDE 0.9 % IV BOLUS (SEPSIS)
1000.0000 mL | Freq: Once | INTRAVENOUS | Status: AC
  Administered 2014-05-24: 1000 mL via INTRAVENOUS

## 2014-05-24 MED ORDER — DEXTROSE 5 % IV SOLN
1.0000 g | Freq: Once | INTRAVENOUS | Status: AC
  Administered 2014-05-24: 1 g via INTRAVENOUS
  Filled 2014-05-24: qty 10

## 2014-05-24 MED ORDER — AMIODARONE HCL IN DEXTROSE 360-4.14 MG/200ML-% IV SOLN
60.0000 mg/h | INTRAVENOUS | Status: AC
Start: 2014-05-24 — End: 2014-05-25
  Administered 2014-05-24 (×2): 60 mg/h via INTRAVENOUS
  Filled 2014-05-24 (×2): qty 200

## 2014-05-24 MED ORDER — SODIUM CHLORIDE 0.9 % IV SOLN
250.0000 mL | INTRAVENOUS | Status: DC | PRN
  Administered 2014-05-24: 250 mL via INTRAVENOUS

## 2014-05-24 MED ORDER — DILTIAZEM HCL 100 MG IV SOLR
5.0000 mg/h | INTRAVENOUS | Status: DC
  Administered 2014-05-24: 5 mg/h via INTRAVENOUS
  Filled 2014-05-24: qty 100

## 2014-05-24 MED ORDER — IOHEXOL 350 MG/ML SOLN
100.0000 mL | Freq: Once | INTRAVENOUS | Status: AC | PRN
  Administered 2014-05-24: 100 mL via INTRAVENOUS

## 2014-05-24 MED ORDER — DEXTROSE 5 % IV SOLN
8.0000 mg/h | INTRAVENOUS | Status: DC
  Administered 2014-05-24: 5 mg/h via INTRAVENOUS
  Administered 2014-05-26: 8 mg/h via INTRAVENOUS
  Filled 2014-05-24 (×3): qty 25

## 2014-05-24 MED ORDER — AMIODARONE HCL IN DEXTROSE 360-4.14 MG/200ML-% IV SOLN
30.0000 mg/h | INTRAVENOUS | Status: DC
  Administered 2014-05-25 – 2014-05-27 (×5): 30 mg/h via INTRAVENOUS
  Filled 2014-05-24 (×15): qty 200

## 2014-05-24 MED ORDER — PANTOPRAZOLE SODIUM 40 MG PO TBEC
40.0000 mg | DELAYED_RELEASE_TABLET | Freq: Every day | ORAL | Status: DC
  Administered 2014-05-24 – 2014-06-02 (×10): 40 mg via ORAL
  Filled 2014-05-24 (×10): qty 1

## 2014-05-24 MED ORDER — FENTANYL CITRATE 0.05 MG/ML IJ SOLN
25.0000 ug | INTRAMUSCULAR | Status: DC | PRN
  Administered 2014-05-25 – 2014-05-27 (×2): 50 ug via INTRAVENOUS
  Administered 2014-05-29 – 2014-05-31 (×2): 25 ug via INTRAVENOUS
  Administered 2014-05-31 – 2014-06-01 (×2): 50 ug via INTRAVENOUS
  Filled 2014-05-24 (×6): qty 2

## 2014-05-24 MED ORDER — INFLUENZA VAC SPLIT QUAD 0.5 ML IM SUSY
0.5000 mL | PREFILLED_SYRINGE | INTRAMUSCULAR | Status: AC
  Administered 2014-05-25: 0.5 mL via INTRAMUSCULAR
  Filled 2014-05-24: qty 0.5

## 2014-05-24 MED ORDER — DIGOXIN 0.25 MG/ML IJ SOLN
0.2500 mg | Freq: Four times a day (QID) | INTRAMUSCULAR | Status: AC
  Administered 2014-05-24 – 2014-05-25 (×2): 0.25 mg via INTRAVENOUS
  Filled 2014-05-24 (×4): qty 1

## 2014-05-24 MED ORDER — SODIUM CHLORIDE 0.9 % IV BOLUS (SEPSIS)
500.0000 mL | Freq: Once | INTRAVENOUS | Status: AC
  Administered 2014-05-24: 500 mL via INTRAVENOUS

## 2014-05-24 MED ORDER — METHYLPREDNISOLONE SODIUM SUCC 40 MG IJ SOLR
40.0000 mg | Freq: Two times a day (BID) | INTRAMUSCULAR | Status: DC
  Administered 2014-05-24 – 2014-05-27 (×6): 40 mg via INTRAVENOUS
  Filled 2014-05-24 (×8): qty 1

## 2014-05-24 MED ORDER — OXYCODONE-ACETAMINOPHEN 5-325 MG PO TABS
1.0000 | ORAL_TABLET | Freq: Four times a day (QID) | ORAL | Status: DC | PRN
  Administered 2014-05-27 – 2014-06-02 (×10): 1 via ORAL
  Filled 2014-05-24 (×11): qty 1

## 2014-05-24 MED ORDER — FUROSEMIDE 10 MG/ML IJ SOLN
20.0000 mg | Freq: Once | INTRAMUSCULAR | Status: DC
  Filled 2014-05-24: qty 2

## 2014-05-24 MED ORDER — SODIUM CHLORIDE 0.9 % IJ SOLN: 3.0000 mL | INTRAMUSCULAR | Status: DC | PRN

## 2014-05-24 MED ORDER — AMIODARONE LOAD VIA INFUSION
150.0000 mg | Freq: Once | INTRAVENOUS | Status: AC
  Administered 2014-05-24: 150 mg via INTRAVENOUS
  Filled 2014-05-24: qty 83.34

## 2014-05-24 MED ORDER — ONDANSETRON HCL 4 MG/2ML IJ SOLN
4.0000 mg | Freq: Once | INTRAMUSCULAR | Status: AC
  Administered 2014-05-24: 4 mg via INTRAMUSCULAR
  Filled 2014-05-24: qty 2

## 2014-05-24 MED ORDER — MORPHINE SULFATE 4 MG/ML IJ SOLN
4.0000 mg | Freq: Once | INTRAMUSCULAR | Status: AC
  Administered 2014-05-24: 4 mg via INTRAVENOUS
  Filled 2014-05-24: qty 1

## 2014-05-24 MED ORDER — METOPROLOL TARTRATE 1 MG/ML IV SOLN
2.5000 mg | INTRAVENOUS | Status: DC | PRN
  Administered 2014-05-26: 2.5 mg via INTRAVENOUS
  Filled 2014-05-24: qty 5

## 2014-05-24 MED ORDER — SODIUM CHLORIDE 0.9 % IJ SOLN
3.0000 mL | Freq: Two times a day (BID) | INTRAMUSCULAR | Status: DC
  Administered 2014-05-24 – 2014-05-27 (×6): 3 mL via INTRAVENOUS

## 2014-05-24 MED ORDER — HEPARIN SODIUM (PORCINE) 5000 UNIT/ML IJ SOLN
5000.0000 [IU] | Freq: Three times a day (TID) | INTRAMUSCULAR | Status: DC
  Administered 2014-05-24 – 2014-05-25 (×2): 5000 [IU] via SUBCUTANEOUS
  Filled 2014-05-24 (×5): qty 1

## 2014-05-24 MED ORDER — DILTIAZEM LOAD VIA INFUSION
10.0000 mg | Freq: Once | INTRAVENOUS | Status: AC
  Administered 2014-05-24: 10 mg via INTRAVENOUS
  Filled 2014-05-24: qty 10

## 2014-05-24 MED ORDER — ACETAMINOPHEN 325 MG PO TABS: 650.0000 mg | ORAL_TABLET | Freq: Four times a day (QID) | ORAL | Status: DC | PRN

## 2014-05-24 MED ORDER — PNEUMOCOCCAL VAC POLYVALENT 25 MCG/0.5ML IJ INJ
0.5000 mL | INJECTION | INTRAMUSCULAR | Status: AC
  Administered 2014-05-25: 0.5 mL via INTRAMUSCULAR
  Filled 2014-05-24: qty 0.5

## 2014-05-24 NOTE — ED Notes (Signed)
Rn spoke with Larinda ButteryBeth Crabtree, pt daughter, [phone].

## 2014-05-24 NOTE — ED Notes (Signed)
Patient in room, placed on monitor.

## 2014-05-24 NOTE — ED Notes (Signed)
Per Dr Adriana Simasook, will hold Lasix until further notice due to pt's BP.

## 2014-05-24 NOTE — Progress Notes (Signed)
eLink Physician-Brief Progress Note Patient Name: [name] LemonsReuben S Labreck DOB: 1950/12/04 MRN: [id number]   Date of Service  05/24/2014  HPI/Events of Note  Pt c/o back pain.  eICU Interventions  Will order prn tylenol, percocet, fentanyl for mild/mod/severe pain.     Intervention Category Major Interventions: Other:  Adaiah Morken 05/24/2014, 7:25 PM

## 2014-05-24 NOTE — ED Notes (Signed)
Attempted report 

## 2014-05-24 NOTE — ED Notes (Signed)
Pt states SOB and mid back pain x 1 week. EMS states O2 sats were in mid-80's on arrival to scene. Pt states he is ordered to wear O2 at 2L at night but states he has recently been wearing it all the time lately.

## 2014-05-24 NOTE — ED Provider Notes (Addendum)
CSN: [id number]     Arrival date & time 05/24/14  0865 History  This chart was scribed for [name] Hutching, MD by Leone Payor, ED Scribe. This patient was seen in room APA10/APA10 and the patient's care was started 9:42 AM.    Chief Complaint  Patient presents with  . Shortness of Breath   The history is provided by the patient. No language interpreter was used.    HPI Comments: Level V caveat for urgent need for intervention [name] is a 63 y.o. male with past medical history of A-fib, "MI x 6", ischemic cardiomyopathy, cigarette smoker who presents to the Emergency Department complaining of 1 week of constant, gradually worsened SOB with associated upper back pain. Patient reports having 6 prior MI's with the last one being "a while ago". He reports using  home oxygen at night and a Symbicort inhaler. He reports similar symptoms in the past but not as severe. He continues to smoke. Past Medical History  Diagnosis Date  . Ischemic cardiomyopathy     cardiomyopathy ischemic  . Paroxysmal atrial fibrillation   . Renal calculi   . 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 Chronic systolic dysfunction of left ventricle   . Coronary artery disease   . Chronic pain     chronic left abdominal pain   Past Surgical History  Procedure Laterality Date  . Difibrillator insertion  12/31/06    SJM Current DR RF ICD implanted by Dr Graciela Husbands   Family History  Problem Relation Age of Onset  . Coronary artery disease Other     family history of CAD   History  Substance Use Topics  . Smoking status: Current Some Day Smoker -- 0.50 packs/day for 40 years    Types: Cigarettes  . Smokeless tobacco: Current User     Comment: he is not willing to quit-notes he smokes 3-4 cig's at least every other day 04-01-13  . Alcohol Use: No    Review of Systems  A complete 10 system review of systems was obtained and all systems are negative except as noted in the HPI and PMH.     Allergies  Review of patient's allergies indicates no known allergies.  Home Medications   Prior to Admission medications   Medication Sig Start Date End Date Taking? Authorizing Provider  budesonide-formoterol (SYMBICORT) 160-4.5 MCG/ACT inhaler Inhale 2 puffs into the lungs 2 (two) times daily.   Yes Historical Provider, MD  digoxin (LANOXIN) 0.125 MG tablet Take 1 tablet (0.125 mg total) by mouth daily. 11/11/13  Yes Marinus Maw, MD  gabapentin (NEURONTIN) 300 MG capsule Take 300 mg by mouth at bedtime.   Yes Historical Provider, MD  losartan (COZAAR) 50 MG tablet Take 1 tablet (50 mg total) by mouth daily. 11/11/13  Yes Marinus Maw, MD  metoprolol succinate (TOPROL-XL) 25 MG 24 hr tablet Take 0.5 tablets (12.5 mg total) by mouth daily. 11/11/13  Yes Marinus Maw, MD  nitroGLYCERIN (NITROSTAT) 0.4 MG SL tablet Place 1 tablet (0.4 mg total) under the tongue every 5 (five) minutes as needed for chest pain. 08/02/13  Yes Marinus Maw, MD  omeprazole (PRILOSEC) 20 MG capsule Take 20 mg by mouth daily.   Yes Historical Provider, MD  rivaroxaban (XARELTO) 20 MG TABS tablet Take 1 tablet (20 mg total) by mouth daily. 03/31/14  Yes Marinus Maw, MD  torsemide (DEMADEX) 20 MG tablet Take 1 tablet (20 mg total)  by mouth 2 (two) times daily. 11/11/13  Yes Marinus MawGregg W Taylor, MD   BP 90/68  Pulse 121  Temp(Src) 98.7 F (37.1 C) (Oral)  Resp 25  Ht 5\' 6"  (1.676 m)  Wt 196 lb (88.905 kg)  BMI 31.65 kg/m2  SpO2 97% Physical Exam  Nursing note and vitals reviewed. Constitutional: He is oriented to person, place, and time. He appears well-developed and well-nourished.  Good color, no obvious dyspnea  HENT:  Head: Normocephalic and atraumatic.  Eyes: Conjunctivae and EOM are normal. Pupils are equal, round, and reactive to light.  Neck: Normal range of motion. Neck supple.  Cardiovascular: Normal heart sounds.  An irregularly irregular rhythm present. Tachycardia present.   Patient in Afib.  Tachycardic with irregularly irregular rhythm noted.     Pulmonary/Chest: Effort normal and breath sounds normal.  Abdominal: Soft. Bowel sounds are normal.  Musculoskeletal: Normal range of motion.  Tender around T5  Neurological: He is alert and oriented to person, place, and time.  Skin: Skin is warm and dry.  Psychiatric: He has a normal mood and affect. His behavior is normal.    ED Course  Procedures (including critical care time)  DIAGNOSTIC STUDIES: Oxygen Saturation is 94% on [place], adequate by my interpretation.    COORDINATION OF CARE: 9:49 AM Discussed treatment plan with pt at bedside and pt agreed to plan.   Labs Review Labs Reviewed  CBC WITH DIFFERENTIAL - Abnormal; Notable for the following:    WBC 12.1 (*)    Neutrophils Relative % 80 (*)    Neutro Abs 9.7 (*)    Lymphocytes Relative 8 (*)    Monocytes Absolute 1.1 (*)    All other components within normal limits  BASIC METABOLIC PANEL - Abnormal; Notable for the following:    CO2 36 (*)    Glucose, Bld 114 (*)    BUN 25 (*)    All other components within normal limits  PRO B NATRIURETIC PEPTIDE - Abnormal; Notable for the following:    Pro B Natriuretic peptide (BNP) 2785.0 (*)    All other components within normal limits  TROPONIN I    Imaging Review Dg Chest Portable 1 View  05/24/2014   CLINICAL DATA:  Shortness of breath.  EXAM: PORTABLE CHEST - 1 VIEW  COMPARISON:  August 20, 2013.  FINDINGS: Stable cardiomegaly. Right-sided PICC line is unchanged in position. Stable interstitial densities are noted throughout both lungs consistent with scarring or possibly superimposed pulmonary edema. No pneumothorax is noted. Increased right basilar opacity is noted concerning for pneumonia or atelectasis with associated pleural effusion.  IMPRESSION: Stable interstitial densities are noted throughout both lungs which may simply represent chronic scarring, but superimposed edema cannot be excluded. Increased right  basilar opacity is noted concerning for worsening pneumonia or atelectasis with associated pleural effusion.   Electronically Signed   By: Roque LiasJames  Green M.D.   On: 05/24/2014 10:02     EKG Interpretation   Date/Time:  Wednesday May 24 2014 09:35:05 EDT Ventricular Rate:  134 PR Interval:    QRS Duration: 99 QT Interval:  331 QTC Calculation: 494 R Axis:   22 Text Interpretation:  Atrial fibrillation Ventricular tachycardia,  unsustained Abnormal R-wave progression, early transition Probable  inferior infarct, old Consider anterior infarct Lateral leads are also  involved Confirmed by Brylea Pita  MD, Cederick Broadnax ([id number]) on 05/24/2014 1:45:34 PM     CRITICAL CARE Performed by: [name] HutchingOOK,Joliyah Lippens Total critical care time: 40 Critical care time was exclusive of  separately billable procedures and treating other patients. Critical care was necessary to treat or prevent imminent or life-threatening deterioration. Critical care was time spent personally by me on the following activities: development of treatment plan with patient and/or surrogate as well as nursing, discussions with consultants, evaluation of patient's response to treatment, examination of patient, obtaining history from patient or surrogate, ordering and performing treatments and interventions, ordering and review of laboratory studies, ordering and review of radiographic studies, pulse oximetry and re-evaluation of patient's condition. MDM   Final diagnoses:  SOB (shortness of breath)  Back pain    CT angiogram chest/abdomen/pelvis  ordered to rule out aortic dissection or aneurysm.  Cardizem initiated, but patient became hypotensive. Cardizem stopped. Small aliquots of morphine given. Lasix 20 mg IV and Rocephin 1 g / Zithromax 500 mg IV initiated. Troponin negative.   I personally performed the services described in this documentation, which was scribed in my presence. The recorded information has been reviewed and is  accurate.   [name] HutchingBrian Shaton Lore, MD 05/24/14 1340  [name] HutchingBrian Zakariya Knickerbocker, MD 05/26/14 [phone]

## 2014-05-24 NOTE — ED Notes (Signed)
CT called to inform them that patient is ready for CT, they are getting ready

## 2014-05-24 NOTE — ED Notes (Signed)
Pt noted to be standing up at the edge of bed attempting to use the urinal.  This RN reinforced to pt the need to use call bell and that it was not a good idea to be standing up unassisted due to pt being very short of breath. Pt with labored breathing, O2 sats in the 80's on 4 L.

## 2014-05-24 NOTE — ED Notes (Signed)
Critical care at bedside  

## 2014-05-24 NOTE — H&P (Signed)
PULMONARY / CRITICAL CARE MEDICINE   Name: [name] MRN: [id number] DOB: November 06, 1950    ADMISSION DATE:  05/24/2014 CONSULTATION DATE:  05/24/2014  REFERRING MD :  EDP  CHIEF COMPLAINT:  SOB  INITIAL PRESENTATION: 63 year old male with history of CHF (LVEF 20-25%), COPD, presented to [facility] ED 10/28 c/o SOB with chest and upper back pain. CT chest showed bilateral effusion R>L and small R pneumothorax. PCCM consulted for admission.  STUDIES:  10/28 CTA chest > Bilateral effusion, R>L. Small R PTX. LUL nodule.   SIGNIFICANT EVENTS:   HISTORY OF PRESENT ILLNESS:  63 year old male with PMH as below, which includes CAD s/p several stents, CHF (LCEF 20-25% 07/2013), COPD (On home O2, followed by Dr. Juanetta Gosling, PFTs suggestive of restrictive disease), and Afib refractory to drugs and cardioversion, on rivaroxaban. He has a pacemaker. He had been feeling ill for several months prior to admission. About one month prior he began having chest pain, sob, and peripheral edema. He had been severely limited at home, unable to perform ADLs without becoming profoundly dyspneic. The morning of 10/28 he called his sister to take him to ED, however he was so dyspneic she had to call EMS. In ED he was SOB and hypotensive with chest and upper back pain. He was sent for CTA to r/o aortic dissection. CT showed large bilateral effusions R>L with small R PTX. PCCM has been called for admission.   PAST MEDICAL HISTORY :   has a past medical history of Ischemic cardiomyopathy; Paroxysmal atrial fibrillation; Renal calculi; Gall bladder disease; Chronic systolic dysfunction of left ventricle; Coronary artery disease; and Chronic pain.  has past surgical history that includes DIFIBRILLATOR INSERTION (12/31/06). Prior to Admission medications   Medication Sig Start Date End Date Taking? Authorizing Provider  budesonide-formoterol (SYMBICORT) 160-4.5 MCG/ACT inhaler Inhale 2 puffs into the lungs 2 (two) times daily.    Yes Historical Provider, MD  digoxin (LANOXIN) 0.125 MG tablet Take 1 tablet (0.125 mg total) by mouth daily. 11/11/13  Yes Marinus Maw, MD  gabapentin (NEURONTIN) 300 MG capsule Take 300 mg by mouth at bedtime.   Yes Historical Provider, MD  losartan (COZAAR) 50 MG tablet Take 1 tablet (50 mg total) by mouth daily. 11/11/13  Yes Marinus Maw, MD  metoprolol succinate (TOPROL-XL) 25 MG 24 hr tablet Take 0.5 tablets (12.5 mg total) by mouth daily. 11/11/13  Yes Marinus Maw, MD  nitroGLYCERIN (NITROSTAT) 0.4 MG SL tablet Place 1 tablet (0.4 mg total) under the tongue every 5 (five) minutes as needed for chest pain. 08/02/13  Yes Marinus Maw, MD  omeprazole (PRILOSEC) 20 MG capsule Take 20 mg by mouth daily.   Yes Historical Provider, MD  rivaroxaban (XARELTO) 20 MG TABS tablet Take 1 tablet (20 mg total) by mouth daily. 03/31/14  Yes Marinus Maw, MD  torsemide (DEMADEX) 20 MG tablet Take 1 tablet (20 mg total) by mouth 2 (two) times daily. 11/11/13  Yes Marinus Maw, MD   No Known Allergies  FAMILY HISTORY:  has no family status information on file.  SOCIAL HISTORY:  reports that he has been smoking Cigarettes.  He has a 20 pack-year smoking history. He uses smokeless tobacco. He reports that he does not drink alcohol or use illicit drugs.  REVIEW OF SYSTEMS:   Bolds are positive  Constitutional: weight loss, gain, night sweats, Fevers, chills, fatigue .  HEENT: headaches, Sore throat, sneezing, nasal congestion, post nasal drip,  Difficulty swallowing, Tooth/dental problems, visual complaints visual changes, ear ache CV:  chest pain, sharp non-radiating, Orthopnea, PND, swelling in lower extremities, dizziness, palpitations, syncope.  GI  heartburn, indigestion, abdominal pain, nausea, vomiting, diarrhea, change in bowel habits, loss of appetite, bloody stools.  Resp: cough, productive: for grey sputum, hemoptysis, dyspnea Skin: rash or itching or icterus GU: dysuria, change in color  of urine, urgency or frequency. flank pain, hematuria  MS: joint pain or swelling. decreased range of motion  Psych: change in mood or affect. depression or anxiety.  Neuro: difficulty with speech, weakness, numbness, ataxia    SUBJECTIVE:   VITAL SIGNS: Temp:  [98.3 F (36.8 C)-98.7 F (37.1 C)] 98.3 F (36.8 C) (10/28 1450) Pulse Rate:  [25-130] 37 (10/28 1645) Resp:  [17-28] 21 (10/28 1715) BP: (77-129)/(51-92) 98/55 mmHg (10/28 1715) SpO2:  [87 %-100 %] 99 % (10/28 1645) Weight:  [88.905 kg (196 lb)] 88.905 kg (196 lb) (10/28 0933) HEMODYNAMICS:   VENTILATOR SETTINGS:   INTAKE / OUTPUT: No intake or output data in the 24 hours ending 05/24/14 1722  PHYSICAL EXAMINATION: General:  Male in no acute distress Neuro:  Alert, oriented x 3. Strange affect. With very short answers.  HEENT:  [place]/AT, JVD difficult to assess due to neck girth.  Cardiovascular:  Tachy, irregularly irregular.  Lungs:  Coarse bilateral breath sounds. Diminished bilateral bases. Respirations unlabored on 3L [place] Abdomen:  Obese, soft, non-tender.  Musculoskeletal:  No acute deformity or ROM limitation. +2 pitting edema to BLE  Skin:  Intact, warm  LABS:  CBC  Recent Labs Lab 05/24/14 1001  WBC 12.1*  HGB 15.8  HCT 49.0  PLT 209   Coag's No results found for this basename: APTT, INR,  in the last 168 hours BMET  Recent Labs Lab 05/24/14 1001  NA 144  K 4.4  CL 100  CO2 36*  BUN 25*  CREATININE 0.83  GLUCOSE 114*   Electrolytes  Recent Labs Lab 05/24/14 1001  CALCIUM 9.5   Sepsis Markers  Recent Labs Lab 05/24/14 1539  LATICACIDVEN 1.2   ABG No results found for this basename: PHART, PCO2ART, PO2ART,  in the last 168 hours Liver Enzymes No results found for this basename: AST, ALT, ALKPHOS, BILITOT, ALBUMIN,  in the last 168 hours Cardiac Enzymes  Recent Labs Lab 05/24/14 1001 05/24/14 1539  TROPONINI <0.30 <0.30  PROBNP 2785.0*  --    Glucose No results found  for this basename: GLUCAP,  in the last 168 hours  Imaging No results found.   ASSESSMENT / PLAN:  Bilateral pleural effusions R>L likely 2nd to decompensated CHF Small R pneumothorax COPD with acute exacerbation - Supplemental O2 to maintain SpO2 greater than 92% - Follow daily CXR - Will likely need thoracentesis, however on rivaroxaban (last dose 10/28), should wait 48 hours (10/30) - Diurese as below  - Scheduled nebulized Atrovent  - Solumedrol 40mg  BID  Acute on chronic systolic CHF Echo current from 07/2013. Atrial fibrillation with RVR on rivaroxaban, has failed cardioversion several times.  Hypotension H/o CAD - troponin negative x 2 in ED - Rate control for Afib - IV lopressor PRN to maintain HR < 120 BPM - Start furosemide gtt at 5mg /hr - Daily Bmet, replace electrolytes as indicated - Hold diltiazem infusion in setting of hypotension - KVO IVF - Hold rivaroxaban - Holding preadmission digoxin, losartan, metoprolol, torsemide - Consult cardiology   VTE ppx: heparin SQ SUP: PO pantoprazole Diet: Low sodium, hearth healthy  Joneen RoachPaul Hoffman, ACNP [place] [facility] Pager [phone] or [phone]    Attending note- I reviewed imaging with radiology. He does seem to have a small right-sided pneumothorax, however I highly doubt that this is empyema or bronchopleural fistula. More likely this is, a small spontaneous pneumothorax with bilateral pleural effusions due to acute on chronic systolic heart failure. His entire exam is consistent with acute systolic heart failure, with 3 pillow orthopnea. Although his prior lung function testing did not show significant COPD, he does seem to have bronchospasm today, hence we will treat as COPD exacerbation. He will need sampling of pleural fluid from the right pleural space, but would defer until rivaroxaban is out of his system - probably another 48 hours. In the absence of leukocytosis, do not feel the  need for antibiotics. He has failed prior attempts at rhythm control, we'll primarily aim for rate control with low-dose metoprolol. Given soft blood pressure, will use a Lasix drip at low dose for gentle diuresis and titrate to effect.  I discussed advanced directives with him briefly, he does desire mechanical ventilation if needed.  The patient is critically ill with multiple organ systems failure and requires high complexity decision making for assessment and support, frequent evaluation and titration of therapies, application of advanced monitoring technologies and extensive interpretation of multiple databases. Critical Care Time devoted to patient care services described in this note is 45 minutes.   Care during the described time interval was provided by me and/or other providers on the critical care team.  I have reviewed this patient's available data, including medical history, events of note, physical examination and test results as part of my evaluation  Oretha MilchALVA,RAKESH V. MD

## 2014-05-24 NOTE — Consult Note (Signed)
CARDIOLOGY CONSULT NOTE   Patient ID: [name] LemonsReuben S Marlatt MRN: [id number] DOB/AGE: [birth year] 63 y.o.  Admit date: 05/24/2014  Primary Physician   [name],ASHISH, MD Primary Cardiologist   Dr. [name] Reason for Consultation  CHF  HPI: [name] is a 63 y.o. male with a history of CAD s/p multiple stents, ischemic CM/CHF (LVEF 20-25%) s/p ICD, chronic atrial fibrillation on Eliquis, continued tobacco abuse, and COPD who [facility] today with SOB and chest and upper back pain. CT chest showed bilateral effusion R>L and small R pneumothorax. Cardiology was consulted for help with management of his atrial fibrillation and CHF.  He has refractory atrial fibrillation and failed multiple DCCV in the past, despite treatment with amiodarone. Also, long history of CAD. Last LHC in 2008 which showed, moderately severe left ventricular dysfunction with elevated LVEDP and wall motion abnormalities, continued patency of the LAD stent with moderate ostial LAD disease, although not critical, continued patency of the circumflex stent with subtotal occlusion of the AV circumflex which is small, and mild to moderate ostial narrowing of the RCA with continued patency of the mid vessel stent.  He had been feeling ill for several months prior to admission. About one month prior he began having chest pain, sob, and peripheral edema. He had been severely limited at home, unable to perform ADLs without becoming profoundly dyspneic. The morning of 10/28 he called his sister to take him to ED, however he was so dyspneic she had to call EMS. In ED he was SOB and hypotensive with chest and upper back pain. He was sent for CTA to r/o aortic dissection. CT showed large bilateral effusions R>L with small R PTX.Marland Kitchen. There is no evidence of dissection. He was started on diltiazem but became hypotensive on diltiazem and this was stopped and he was given fluids and transferred here. He is currently not complaining of  severe shortness of breath at the present time and the chest pain is better. He has had significant increase in his atrial fibrillation rate. He continues to smoke cigarettes around one half to one pack per day.  Past Medical History  Diagnosis Date  . Ischemic cardiomyopathy     cardiomyopathy ischemic  . Paroxysmal atrial fibrillation   . Renal calculi   . 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 Chronic systolic dysfunction of left ventricle   . Coronary artery disease   . Chronic pain     chronic left abdominal pain     Past Surgical History  Procedure Laterality Date  . Difibrillator insertion  12/31/06    SJM Current DR RF ICD implanted by Dr Graciela HusbandsKlein   No Known Allergies  I have reviewed the patient's current medications . furosemide  20 mg Intravenous Once  . heparin  5,000 Units Subcutaneous 3 times per day  . ipratropium  0.5 mg Nebulization Q4H  . methylPREDNISolone (SOLU-MEDROL) injection  40 mg Intravenous Q12H  . pantoprazole  40 mg Oral Daily  . sodium chloride  3 mL Intravenous Q12H   . sodium chloride    . sodium chloride    . diltiazem (CARDIZEM) infusion Stopped (05/24/14 1047)  . furosemide (LASIX) infusion     sodium chloride, sodium chloride, metoprolol, sodium chloride  Prior to Admission medications   Medication Sig Start Date End Date Taking? Authorizing Provider  B Complex-C (B-COMPLEX WITH VITAMIN C) tablet Take 1 tablet by mouth daily.   Yes  Historical Provider, MD  budesonide-formoterol (SYMBICORT) 160-4.5 MCG/ACT inhaler Inhale 2 puffs into the lungs 2 (two) times daily.   Yes Historical Provider, MD  digoxin (LANOXIN) 0.125 MG tablet Take 1 tablet (0.125 mg total) by mouth daily. 11/11/13  Yes Marinus MawGregg W Taylor, MD  gabapentin (NEURONTIN) 300 MG capsule Take 300 mg by mouth at bedtime.   Yes Historical Provider, MD  losartan (COZAAR) 50 MG tablet Take 1 tablet (50 mg total) by mouth daily. 11/11/13  Yes Marinus MawGregg W Taylor, MD    metoprolol succinate (TOPROL-XL) 25 MG 24 hr tablet Take 0.5 tablets (12.5 mg total) by mouth daily. 11/11/13  Yes Marinus MawGregg W Taylor, MD  nitroGLYCERIN (NITROSTAT) 0.4 MG SL tablet Place 1 tablet (0.4 mg total) under the tongue every 5 (five) minutes as needed for chest pain. 08/02/13  Yes Marinus MawGregg W Taylor, MD  omeprazole (PRILOSEC) 20 MG capsule Take 20 mg by mouth daily.   Yes Historical Provider, MD  rivaroxaban (XARELTO) 20 MG TABS tablet Take 1 tablet (20 mg total) by mouth daily. 03/31/14  Yes Marinus MawGregg W Taylor, MD  torsemide (DEMADEX) 20 MG tablet Take 1 tablet (20 mg total) by mouth 2 (two) times daily. 11/11/13  Yes Marinus MawGregg W Taylor, MD    History   Social History  . Marital Status: Divorced    Spouse Name: N/A    Number of Children: N/A  . Years of Education: N/A   Occupational History  . RETIRED    Social History Main Topics  . Smoking status: Current Some Day Smoker -- 0.50 packs/day for 40 years    Types: Cigarettes  . Smokeless tobacco: Current User     Comment: he is not willing to quit-notes he smokes 3-4 cig's at least every other day 04-01-13  . Alcohol Use: No  . Drug Use: No  . Sexual Activity: No   Other Topics Concern  . Not on file   Social History Narrative   DISABLED ,DIVORCED    No family status information on file.   Family History  Problem Relation Age of Onset  . 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 He has urinary frequency and some nocturia. Complains of constipation. Significant dyspnea with exertion. Chronic abdominal pain in the past. Ongoing significant tobacco abuse. Some headaches, some anxiety, other than as noted above the remainder of the review of systems is unremarkable.  Physical Exam: Blood pressure currently 88/60, pulse 130 with PVCs temperature 98.3 F (36.8 C), temperature source Oral, resp. rate 23, height 5\' 6"  (1.676 m), weight 196 lb (88.905 kg), SpO2  98.00%.   General: Middle-aged appearing male who is not in any acute respiratory distress Neuro: Alert, oriented x 3. Strange affect. With very short answers.  HEENT: EOMI, PERRLA,  Neck: JVD to the angle of the jaw  Cardiovascular: Tachy, irregularly irregular. Normal S1 and S2 no murmurs Lungs: Bilateral wheezing present  Abdomen: Obese, soft, non-tender.  Musculoskeletal: Trace edema, mild venous insufficiency +1 pitting edema to BLE  Skin: Somewhat red Neurologic: Normal  Labs:   Lab Results  Component Value Date   WBC 12.1* 05/24/2014   HGB 15.8 05/24/2014   HCT 49.0 05/24/2014   MCV 99.0 05/24/2014   PLT 209 05/24/2014     Recent Labs Lab 05/24/14 1001  NA 144  K 4.4  CL 100  CO2 36*  BUN 25*  CREATININE  0.83  CALCIUM 9.5  GLUCOSE 114*   Magnesium  Date Value Ref Range Status  08/22/2013 2.2  1.5 - 2.5 mg/dL Final    Recent Labs  16/10/96 1001 05/24/14 1539  TROPONINI <0.30 <0.30    Pro B Natriuretic peptide (BNP)  Date/Time Value Ref Range Status  05/24/2014 10:01 AM 2785.0* 0 - 125 pg/mL Final  08/20/2013  3:39 PM 2069.0* 0 - 125 pg/mL Final     Echo: Study Date: 08/21/2013 LV EF: 20% - 25% Study Conclusions - Left ventricle: The cavity size was mildly dilated. Wall thickness was normal. Systolic function was severely reduced. The estimated ejection fraction was in the range of 20% to 25%. There is dyskinesis of the mid-distalinferoseptal myocardium. There is akinesis of the apical myocardium. There is hypokinesis of the basal-midinferolateral and inferior myocardium. There is akinesis of the distalinferior myocardium. Features are consistent with a pseudonormal left ventricular filling pattern, with concomitant abnormal relaxation and increased filling pressure (grade 2 diastolic dysfunction). - Ventricular septum: Septal motion showed abnormal function and dyssynergy. - Aortic valve: Mildly calcified annulus. Trileaflet;  mildly calcified leaflets. No significant regurgitation. - Mitral valve: Calcified annulus. Trivial regurgitation. - Left atrium: The atrium was moderately dilated. - Right ventricle: Pacer wire or catheter noted in right ventricle. - Right atrium: Central venous pressure: 8mm Hg (est). - Tricuspid valve: Trivial regurgitation. - Pulmonary arteries: PA peak pressure: 29mm Hg (S). - Pericardium, extracardiac: There was no pericardial effusion. Impressions: - Mildly dilated LV with normal wall thickness and LVEF 20-25%, wall motion abnormalities consistent with ischemic cardiomyopathy. Grade2 diastolic dysfunction. Abnormal septal motion. Moderate left atrial enlargement. Device wire in the right heart. Trivial tricuspid regurgitation with PASP 29 mmHg.   ECG:  Atrial fibrillation with RVR HR 134  Radiology:  Dg Chest Portable 1 View  05/24/2014   CLINICAL DATA:  Shortness of breath.  EXAM: PORTABLE CHEST - 1 VIEW  COMPARISON:  January 24,  IMPRESSION: Stable interstitial densities are noted throughout both lungs which may simply represent chronic scarring, but superimposed edema cannot be excluded. Increased right basilar opacity is noted concerning for worsening pneumonia or atelectasis with associated pleural effusion.   Electronically Signed   By: Roque Lias M.D.   On: 05/24/2014 10:02   Ct Angio Chest Aorta W/cm &/or Wo/cm  05/24/2014     IMPRESSION: 1. No dissection or pulmonary embolus identified. 2. Large right pleural effusion with small pneumothorax component (hydropneumothorax). Moderate left pleural effusion. Scattered atelectasis in both lungs, mostly passive. 3. Enhancing nodule in the left upper lobe measuring 1.5 by 1.2 cm. This has increased from the previous reported size of 0.5 cm on 04/14/2011. Appearance is now concerning for lung cancer. Biopsy recommended when clinically feasible. 4. 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 5. Several hypodense lesions in the liver are similar to the 03/08/2012 exam and accordingly probably benign. 6. Left mid kidney cyst and the right kidney lower pole scarring.    Electronically Signed   By: Herbie Baltimore M.D.   On: 05/24/2014 16:42   Ct Angio Abd/pel W/ And/or W/o  05/24/2014    ASSESSMENT AND PLAN:    Active Problems:   Acute on chronic systolic CHF (congestive heart failure)  TIRON SUSKI is a 63 y.o. male with a history of CAD s/p multiple stents, ischemic CM/CHF (LVEF 20-25%) s/p ICD, chronic atrial fibrillation on Eliquis, continued tobacco abuse, and COPD who  presented to [facility] ED today with SOB and chest and upper back pain. CT chest showed bilateral effusion R>L and small R pneumothorax. Cardiology was consulted for help with management of his atrial fibrillation and CHF.  Acute on chronic systolic/diastolic CHF with bilateral pleural effusions R>L and small R pneumothorax. BNP 2785. -- ECHO from 07/2013 with EF 20-25%, wall motion abnormalities consistent with ischemic cardiomyopathy and G2DD -- IV diuresis limited by hypotension. Started on furosemide gtt at 5mg /hr but has not received this yet -- Strict I/Os -- Continue losartan and toprol xl when blood pressure will allow   Atrial fibrillation with RVR on rivaroxaban, has failed cardioversion several times in the past -- Hold Xarelto as he will likely need a thoracocentesis per PCCM -- Started on Dilt gtt at [facility] and dropped his pressure so this was discontinued and he was given IVFs -- Rates still not well controlled plan to give additional doses of intravenous digoxin as well as amiodarone to help control rate   Hypotension - hold BP meds for now -- Last pressure 80/48, which is limiting diuresis and afib rate control  CAD- s/p multiple stents -- Troponin negative x 2 in ED. ECG with no acute ST or TW changes -- Continue to monitor  COPD with acute exacerbation  --  Supplemental O2  -- Will likely need thoracentesis, however on rivaroxaban (last dose 10/28), should wait 48 hours (10/30)  -- Per CCM  Leukocytosis- mild with a left shift.  -- Continue to monitor  Possible lung cancer that needs to be addressed this admission as manifested by nodule on chest x-ray  Signed:  W. Ashley Royalty. MD [facility]   05/24/2014 8:10 PM

## 2014-05-24 NOTE — ED Notes (Signed)
Pt c/o substernal chest pain. Pt cannot describe pain. Dr. Adriana Simasook notified and stated he would see pt.

## 2014-05-24 NOTE — ED Notes (Signed)
Pt back from CT with RN

## 2014-05-24 NOTE — ED Provider Notes (Addendum)
3:21 PM Transfer from [facility] for CTA chest/abd/pelvis to evaluate for dissection. Transfer because CT scanner is down.   SOB x 1 week with some upper back pain. No CP. No fevers. Productive cough. No orthopnea. Minimal exertion causes worsening SOB. Hx of paroxysmal atrial fibrillation on anticoagulation.  Also on digoxin.  Patient states compliance with his medications.  Also found to be in atrial fibrillation with rapid ventricular response.  Initial Cardizem bolus given at outside facility with development of hypotension.  Currently not on a Cardizem drip.  Rate 120s.  Remains in atrial fibrillation.  Given his initial x-ray in his presentation worsening shortness of breath over the past week I suspect this is more presentation of multifocal pneumonia.  CT scan pending at this time.  500 cc fluid bolus will be given.  Digoxin level added.  Second troponin added.  Lactate level pending.    Results for orders placed during the hospital encounter of 05/24/14  CBC WITH DIFFERENTIAL      Result Value Ref Range   WBC 12.1 (*) 4.0 - 10.5 K/uL   RBC 4.95  4.22 - 5.81 MIL/uL   Hemoglobin 15.8  13.0 - 17.0 g/dL   HCT 95.2  84.1 - 32.4 %   MCV 99.0  78.0 - 100.0 fL   MCH 31.9  26.0 - 34.0 pg   MCHC 32.2  30.0 - 36.0 g/dL   RDW 40.1  02.7 - 25.3 %   Platelets 209  150 - 400 K/uL   Neutrophils Relative % 80 (*) 43 - 77 %   Neutro Abs 9.7 (*) 1.7 - 7.7 K/uL   Lymphocytes Relative 8 (*) 12 - 46 %   Lymphs Abs 1.0  0.7 - 4.0 K/uL   Monocytes Relative 9  3 - 12 %   Monocytes Absolute 1.1 (*) 0.1 - 1.0 K/uL   Eosinophils Relative 3  0 - 5 %   Eosinophils Absolute 0.3  0.0 - 0.7 K/uL   Basophils Relative 0  0 - 1 %   Basophils Absolute 0.0  0.0 - 0.1 K/uL  BASIC METABOLIC PANEL      Result Value Ref Range   Sodium 144  137 - 147 mEq/L   Potassium 4.4  3.7 - 5.3 mEq/L   Chloride 100  96 - 112 mEq/L   CO2 36 (*) 19 - 32 mEq/L   Glucose, Bld 114 (*) 70 - 99 mg/dL   BUN 25 (*) 6 - 23 mg/dL   Creatinine,  Ser 6.64  0.50 - 1.35 mg/dL   Calcium 9.5  8.4 - 40.3 mg/dL   GFR calc non Af Amer >90  >90 mL/min   GFR calc Af Amer >90  >90 mL/min   Anion gap 8  5 - 15  PRO B NATRIURETIC PEPTIDE      Result Value Ref Range   Pro B Natriuretic peptide (BNP) 2785.0 (*) 0 - 125 pg/mL  TROPONIN I      Result Value Ref Range   Troponin I <0.30  <0.30 ng/mL  DIGOXIN LEVEL      Result Value Ref Range   Digoxin Level 1.0  0.8 - 2.0 ng/mL  TROPONIN I      Result Value Ref Range   Troponin I <0.30  <0.30 ng/mL  LACTIC ACID, PLASMA      Result Value Ref Range   Lactic Acid, Venous 1.2  0.5 - 2.2 mmol/L    Dg Chest Portable 1 View  05/24/2014  CLINICAL DATA:  Shortness of breath.  EXAM: PORTABLE CHEST - 1 VIEW  COMPARISON:  August 20, 2013.  FINDINGS: Stable cardiomegaly. Right-sided PICC line is unchanged in position. Stable interstitial densities are noted throughout both lungs consistent with scarring or possibly superimposed pulmonary edema. No pneumothorax is noted. Increased right basilar opacity is noted concerning for pneumonia or atelectasis with associated pleural effusion.  IMPRESSION: Stable interstitial densities are noted throughout both lungs which may simply represent chronic scarring, but superimposed edema cannot be excluded. Increased right basilar opacity is noted concerning for worsening pneumonia or atelectasis with associated pleural effusion.   Electronically Signed   By: Roque Lias M.D.   On: 05/24/2014 10:02   Ct Angio Chest Aorta W/cm &/or Wo/cm  05/24/2014   CLINICAL DATA:  Acute back pain. Ischemic cardiomyopathy. Shortness of breath. Productive cough.  Cyst more atrial fibrillation, on anti coagulation. Worsening shortness of breath. Possible sepsis.  EXAM: CT ANGIOGRAPHY CHEST, ABDOMEN AND PELVIS  TECHNIQUE: Multidetector CT imaging through the chest, abdomen and pelvis was performed using the standard protocol during bolus administration of intravenous contrast. Multiplanar  reconstructed images and MIPs were obtained and reviewed to evaluate the vascular anatomy.  CONTRAST:  100 cc Omnipaque 350  COMPARISON:  None.  FINDINGS: CTA CHEST FINDINGS  Initial noncontrast CT images of the chest demonstrate no findings of acute intramural hematoma in the aorta.  Dissection protocol systemic arterial phase images through the chest demonstrate only mild atherosclerotic calcification in the aorta. coronary artery stents and calcification noted. Cardiomegaly involving all 4 chambers. 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 Thinning of the left ventricular apex with a small pseudoaneurysm of the LV apex shown on images 75 through 80 of series 5 in the vicinity of the infarct marked.  There is enough contrast in the pulmonary arterial vasculature to indicate a low likelihood of pulmonary embolus although the pulmonary embolus protocol was not specifically obtained.  A 1.5 by 1.2 cm pleural-based left upper lobe nodule is present posteriorly on image 23 of series 5. Comparing pre and post-contrast images this nodule is clearly enhancing.  Large right and moderate left pleural effusions. No separate enhancing pleural mass is visible nor is there significant pleural enhancement to specifically indicate an exudative effusion. However, there is a small right pneumothorax visible, with the gas component at under 5% of right hemithoracic volume.  Scattered lymph nodes in the chest include an 8 mm in short axis prevascular lymph node on image 24 of series 5 and a 1 cm right paratracheal lymph node on image 36 of series 5. There is considerable passive atelectasis in both lungs. Pacer device observed.  Review of the MIP images confirms the above findings.  CTA ABDOMEN AND PELVIS FINDINGS  The celiac trunk, SMA, and IMA appear patent. Mild intimal thickening and faint atherosclerotic calcification distally in the abdominal aorta  intra and in the iliac vessels. There 2 renal arteries present bilaterally, which appear patent. No aneurysm or dissection observed.  Abnormal hypodense lesion in the left hepatic lobe measuring 1.4 by 1.0 cm on image 86 of series 5, nonspecific. Similar lesion anteriorly in the left hepatic lobe, 1.0 by 0.8 cm on image 92 of series 5. These are similar to 03/08/2012 equivocal nodularity of the liver margin.  The spleen, pancreas, and adrenal glands appear unremarkable. No pathologic upper abdominal adenopathy is observed. No pathologic pelvic adenopathy is observed. Left mid kidney 4.4 by  4.1 cm cyst, image 117 of series 5. Right kidney lower pole scar.  No dilated bowel.  Appendix normal.  No free pelvic fluid.  Mildly prominent prostate gland 5.1 by 4.8 cm with some lower central calcifications.  Review of the MIP images confirms the above findings.  IMPRESSION: 1. No dissection or pulmonary embolus identified. 2. Large right pleural effusion with small pneumothorax component (hydropneumothorax). Moderate left pleural effusion. Scattered atelectasis in both lungs, mostly passive. 3. Enhancing nodule in the left upper lobe measuring 1.5 by 1.2 cm. This has increased from the previous reported size of 0.5 cm on 04/14/2011. Appearance is now concerning for lung cancer. Biopsy recommended when clinically feasible. 4. 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 5. Several hypodense lesions in the liver are similar to the 03/08/2012 exam and accordingly probably benign. 6. Left mid kidney cyst and the right kidney lower pole scarring.   Electronically Signed   By: Herbie BaltimoreWalt  Liebkemann M.D.   On: 05/24/2014 16:42   Ct Angio Abd/pel W/ And/or W/o  05/24/2014   CLINICAL DATA:  Acute back pain. Ischemic cardiomyopathy. Shortness of breath. Productive cough.  Cyst more atrial fibrillation, on anti coagulation. Worsening shortness of breath. Possible sepsis.   EXAM: CT ANGIOGRAPHY CHEST, ABDOMEN AND PELVIS  TECHNIQUE: Multidetector CT imaging through the chest, abdomen and pelvis was performed using the standard protocol during bolus administration of intravenous contrast. Multiplanar reconstructed images and MIPs were obtained and reviewed to evaluate the vascular anatomy.  CONTRAST:  100 cc Omnipaque 350  COMPARISON:  None.  FINDINGS: CTA CHEST FINDINGS  Initial noncontrast CT images of the chest demonstrate no findings of acute intramural hematoma in the aorta.  Dissection protocol systemic arterial phase images through the chest demonstrate only mild atherosclerotic calcification in the aorta. coronary artery stents and calcification noted. Cardiomegaly involving all 4 chambers. 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 Thinning of the left ventricular apex with a small pseudoaneurysm of the LV apex shown on images 75 through 80 of series 5 in the vicinity of the infarct marked.  There is enough contrast in the pulmonary arterial vasculature to indicate a low likelihood of pulmonary embolus although the pulmonary embolus protocol was not specifically obtained.  A 1.5 by 1.2 cm pleural-based left upper lobe nodule is present posteriorly on image 23 of series 5. Comparing pre and post-contrast images this nodule is clearly enhancing.  Large right and moderate left pleural effusions. No separate enhancing pleural mass is visible nor is there significant pleural enhancement to specifically indicate an exudative effusion. However, there is a small right pneumothorax visible, with the gas component at under 5% of right hemithoracic volume.  Scattered lymph nodes in the chest include an 8 mm in short axis prevascular lymph node on image 24 of series 5 and a 1 cm right paratracheal lymph node on image 36 of series 5. There is considerable passive atelectasis in both lungs. Pacer device observed.   Review of the MIP images confirms the above findings.  CTA ABDOMEN AND PELVIS FINDINGS  The celiac trunk, SMA, and IMA appear patent. Mild intimal thickening and faint atherosclerotic calcification distally in the abdominal aorta intra and in the iliac vessels. There 2 renal arteries present bilaterally, which appear patent. No aneurysm or dissection observed.  Abnormal hypodense lesion in the left hepatic lobe measuring 1.4 by 1.0 cm on image 86  of series 5, nonspecific. Similar lesion anteriorly in the left hepatic lobe, 1.0 by 0.8 cm on image 92 of series 5. These are similar to 03/08/2012 equivocal nodularity of the liver margin.  The spleen, pancreas, and adrenal glands appear unremarkable. No pathologic upper abdominal adenopathy is observed. No pathologic pelvic adenopathy is observed. Left mid kidney 4.4 by 4.1 cm cyst, image 117 of series 5. Right kidney lower pole scar.  No dilated bowel.  Appendix normal.  No free pelvic fluid.  Mildly prominent prostate gland 5.1 by 4.8 cm with some lower central calcifications.  Review of the MIP images confirms the above findings.  IMPRESSION: 1. No dissection or pulmonary embolus identified. 2. Large right pleural effusion with small pneumothorax component (hydropneumothorax). Moderate left pleural effusion. Scattered atelectasis in both lungs, mostly passive. 3. Enhancing nodule in the left upper lobe measuring 1.5 by 1.2 cm. This has increased from the previous reported size of 0.5 cm on 04/14/2011. Appearance is now concerning for lung cancer. Biopsy recommended when clinically feasible. 4. 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 5. Several hypodense lesions in the liver are similar to the 03/08/2012 exam and accordingly probably benign. 6. Left mid kidney cyst and the right kidney lower pole scarring.   Electronically Signed   By: Herbie BaltimoreWalt  Liebkemann M.D.   On: 05/24/2014 16:42  I personally  reviewed the imaging tests through PACS system I reviewed available ER/hospitalization records through the EMR  CRITICAL CARE Performed by: Lyanne CoAMPOS,Verdia Bolt M Total critical care time: 32 Critical care time was exclusive of separately billable procedures and treating other patients. Critical care was necessary to treat or prevent imminent or life-threatening deterioration. Critical care was time spent personally by me on the following activities: development of treatment plan with patient and/or surrogate as well as nursing, discussions with consultants, evaluation of patient's response to treatment, examination of patient, obtaining history from patient or surrogate, ordering and performing treatments and interventions, ordering and review of laboratory studies, ordering and review of radiographic studies, pulse oximetry and re-evaluation of patient's condition.  Hypotension.  Lactate pending.  Fluid boluses now.  Antibiotics given.  Suspect sepsis with large right lower lobe pneumonia with associated effusion. Will continue to monitor while in ER. Plan for admission  5:15 PM Spoke with PCCM who will admit the pt. will likely need pigtail catheter drain to resolve the right-sided pleural effusion as well as resolve the right sided pneumothorax.  Blood pressure 100/61.  Remains in A. fib with RVR in the 112 rate region.  Lactate is normal.     Lyanne CoKevin M Yari Szeliga, MD 05/24/14 1612  Lyanne CoKevin M Michelangelo Rindfleisch, MD 05/24/14 [phone]

## 2014-05-25 ENCOUNTER — Inpatient Hospital Stay (HOSPITAL_COMMUNITY): Payer: Medicare Other

## 2014-05-25 ENCOUNTER — Encounter (HOSPITAL_COMMUNITY): Payer: Self-pay | Admitting: Cardiovascular Disease

## 2014-05-25 DIAGNOSIS — I5023 Acute on chronic systolic (congestive) heart failure: Secondary | ICD-10-CM

## 2014-05-25 DIAGNOSIS — I5043 Acute on chronic combined systolic (congestive) and diastolic (congestive) heart failure: Secondary | ICD-10-CM | POA: Diagnosis not present

## 2014-05-25 DIAGNOSIS — I4891 Unspecified atrial fibrillation: Secondary | ICD-10-CM

## 2014-05-25 DIAGNOSIS — Z95810 Presence of automatic (implantable) cardiac defibrillator: Secondary | ICD-10-CM

## 2014-05-25 DIAGNOSIS — I959 Hypotension, unspecified: Secondary | ICD-10-CM

## 2014-05-25 DIAGNOSIS — R0602 Shortness of breath: Secondary | ICD-10-CM

## 2014-05-25 DIAGNOSIS — I255 Ischemic cardiomyopathy: Secondary | ICD-10-CM

## 2014-05-25 DIAGNOSIS — J90 Pleural effusion, not elsewhere classified: Secondary | ICD-10-CM

## 2014-05-25 DIAGNOSIS — I2510 Atherosclerotic heart disease of native coronary artery without angina pectoris: Secondary | ICD-10-CM

## 2014-05-25 LAB — BASIC METABOLIC PANEL
Anion gap: 10 (ref 5–15)
BUN: 32 mg/dL — AB (ref 6–23)
CO2: 32 meq/L (ref 19–32)
Calcium: 9.7 mg/dL (ref 8.4–10.5)
Chloride: 98 mEq/L (ref 96–112)
Creatinine, Ser: 1.28 mg/dL (ref 0.50–1.35)
GFR calc Af Amer: 67 mL/min — ABNORMAL LOW (ref >90)
GFR calc non Af Amer: 58 mL/min — ABNORMAL LOW (ref >90)
GLUCOSE: 129 mg/dL — AB (ref 70–99)
POTASSIUM: 5.5 meq/L — AB (ref 3.7–5.3)
Sodium: 140 mEq/L (ref 137–147)

## 2014-05-25 LAB — CBC
HEMATOCRIT: 49.8 % (ref 39.0–52.0)
HEMOGLOBIN: 15.3 g/dL (ref 13.0–17.0)
MCH: 31.4 pg (ref 26.0–34.0)
MCHC: 30.7 g/dL (ref 30.0–36.0)
MCV: 102.3 fL — ABNORMAL HIGH (ref 78.0–100.0)
Platelets: 214 10*3/uL (ref 150–400)
RBC: 4.87 MIL/uL (ref 4.22–5.81)
RDW: 14.2 % (ref 11.5–15.5)
WBC: 11.6 10*3/uL — ABNORMAL HIGH (ref 4.0–10.5)

## 2014-05-25 LAB — PHOSPHORUS: PHOSPHORUS: 5.7 mg/dL — AB (ref 2.3–4.6)

## 2014-05-25 LAB — MAGNESIUM: Magnesium: 2.1 mg/dL (ref 1.5–2.5)

## 2014-05-25 MED ORDER — CETYLPYRIDINIUM CHLORIDE 0.05 % MT LIQD
7.0000 mL | Freq: Two times a day (BID) | OROMUCOSAL | Status: DC
  Administered 2014-05-26 – 2014-06-02 (×12): 7 mL via OROMUCOSAL

## 2014-05-25 MED ORDER — DIGOXIN 125 MCG PO TABS
0.1250 mg | ORAL_TABLET | Freq: Every day | ORAL | Status: DC
  Administered 2014-05-26: 0.125 mg via ORAL
  Filled 2014-05-25: qty 1

## 2014-05-25 MED ORDER — GABAPENTIN 300 MG PO CAPS
300.0000 mg | ORAL_CAPSULE | Freq: Every day | ORAL | Status: DC
  Administered 2014-05-25 – 2014-05-26 (×2): 300 mg via ORAL
  Filled 2014-05-25 (×3): qty 1

## 2014-05-25 MED ORDER — HEPARIN SODIUM (PORCINE) 5000 UNIT/ML IJ SOLN
5000.0000 [IU] | Freq: Three times a day (TID) | INTRAMUSCULAR | Status: DC
  Administered 2014-05-26 – 2014-05-27 (×4): 5000 [IU] via SUBCUTANEOUS
  Filled 2014-05-25 (×7): qty 1

## 2014-05-25 NOTE — H&P (Signed)
PULMONARY / CRITICAL CARE MEDICINE   Name: [name] MRN: [id number] DOB: January 29, 1951    ADMISSION DATE:  05/24/2014 CONSULTATION DATE:  05/24/2014  REFERRING MD :  EDP  CHIEF COMPLAINT:  SOB  INITIAL PRESENTATION: 63 year old male with history of CHF (LVEF 20-25%), COPD, presented to [facility] ED 10/28 c/o SOB with chest and upper back pain. CT chest showed bilateral effusion R>L and small R pneumothorax. PCCM consulted for admission.  STUDIES:  10/28 CTA chest > Bilateral effusion, R>L. Small R PTX. LUL nodule.   SIGNIFICANT EVENTS:  SUBJECTIVE: No events overnight, respiratory status improved.  VITAL SIGNS: Temp:  [97.3 F (36.3 C)-98.3 F (36.8 C)] 98.2 F (36.8 C) (10/29 0818) Pulse Rate:  [25-143] 110 (10/29 0700) Resp:  [17-28] 19 (10/29 0700) BP: (77-117)/(42-89) 101/65 mmHg (10/29 0700) SpO2:  [87 %-100 %] 98 % (10/29 0818) Weight:  [89.6 kg (197 lb 8.5 oz)-90.2 kg (198 lb 13.7 oz)] 90.2 kg (198 lb 13.7 oz) (10/29 0400) HEMODYNAMICS:   VENTILATOR SETTINGS:   INTAKE / OUTPUT:  Intake/Output Summary (Last 24 hours) at 05/25/14 1138 Last data filed at 05/25/14 0700  Gross per 24 hour  Intake 745.84 ml  Output    945 ml  Net -199.16 ml    PHYSICAL EXAMINATION: General:  Male in no acute distress Neuro:  Alert, oriented x 3. Strange affect. With very short answers.  HEENT:  [place]/AT, JVD difficult to assess due to neck girth.  Cardiovascular:  Tachy, irregularly irregular.  Lungs:  Coarse bilateral breath sounds. Diminished bilateral bases. Respirations unlabored on 3L [place] Abdomen:  Obese, soft, non-tender.  Musculoskeletal:  No acute deformity or ROM limitation. +2 pitting edema to BLE  Skin:  Intact, warm  LABS:  CBC  Recent Labs Lab 05/24/14 1001 05/24/14 2019 05/25/14 0230  WBC 12.1* 12.9* 11.6*  HGB 15.8 15.6 15.3  HCT 49.0 49.1 49.8  PLT 209 207 214   Coag's No results found for this basename: APTT, INR,  in the last 168  hours BMET  Recent Labs Lab 05/24/14 1001 05/24/14 2019 05/25/14 0230  NA 144  --  140  K 4.4  --  5.5*  CL 100  --  98  CO2 36*  --  32  BUN 25*  --  32*  CREATININE 0.83 1.05 1.28  GLUCOSE 114*  --  129*   Electrolytes  Recent Labs Lab 05/24/14 1001 05/25/14 0230  CALCIUM 9.5 9.7  MG  --  2.1  PHOS  --  5.7*   Sepsis Markers  Recent Labs Lab 05/24/14 1539  LATICACIDVEN 1.2   ABG No results found for this basename: PHART, PCO2ART, PO2ART,  in the last 168 hours Liver Enzymes No results found for this basename: AST, ALT, ALKPHOS, BILITOT, ALBUMIN,  in the last 168 hours Cardiac Enzymes  Recent Labs Lab 05/24/14 1001 05/24/14 1539  TROPONINI <0.30 <0.30  PROBNP 2785.0*  --    Glucose No results found for this basename: GLUCAP,  in the last 168 hours  Imaging Dg Chest Portable 1 View  05/24/2014   CLINICAL DATA:  Shortness of breath.  EXAM: PORTABLE CHEST - 1 VIEW  COMPARISON:  August 20, 2013.  FINDINGS: Stable cardiomegaly. Right-sided PICC line is unchanged in position. Stable interstitial densities are noted throughout both lungs consistent with scarring or possibly superimposed pulmonary edema. No pneumothorax is noted. Increased right basilar opacity is noted concerning for pneumonia or atelectasis with associated pleural effusion.  IMPRESSION: Stable  interstitial densities are noted throughout both lungs which may simply represent chronic scarring, but superimposed edema cannot be excluded. Increased right basilar opacity is noted concerning for worsening pneumonia or atelectasis with associated pleural effusion.   Electronically Signed   By: Roque Lias M.D.   On: 05/24/2014 10:02   Ct Angio Chest Aorta W/cm &/or Wo/cm  05/24/2014   CLINICAL DATA:  Acute back pain. Ischemic cardiomyopathy. Shortness of breath. Productive cough.  Cyst more atrial fibrillation, on anti coagulation. Worsening shortness of breath. Possible sepsis.  EXAM: CT ANGIOGRAPHY  CHEST, ABDOMEN AND PELVIS  TECHNIQUE: Multidetector CT imaging through the chest, abdomen and pelvis was performed using the standard protocol during bolus administration of intravenous contrast. Multiplanar reconstructed images and MIPs were obtained and reviewed to evaluate the vascular anatomy.  CONTRAST:  100 cc Omnipaque 350  COMPARISON:  None.  FINDINGS: CTA CHEST FINDINGS  Initial noncontrast CT images of the chest demonstrate no findings of acute intramural hematoma in the aorta.  Dissection protocol systemic arterial phase images through the chest demonstrate only mild atherosclerotic calcification in the aorta. coronary artery stents and calcification noted. Cardiomegaly involving all 4 chambers. 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 Thinning of the left ventricular apex with a small pseudoaneurysm of the LV apex shown on images 75 through 80 of series 5 in the vicinity of the infarct marked.  There is enough contrast in the pulmonary arterial vasculature to indicate a low likelihood of pulmonary embolus although the pulmonary embolus protocol was not specifically obtained.  A 1.5 by 1.2 cm pleural-based left upper lobe nodule is present posteriorly on image 23 of series 5. Comparing pre and post-contrast images this nodule is clearly enhancing.  Large right and moderate left pleural effusions. No separate enhancing pleural mass is visible nor is there significant pleural enhancement to specifically indicate an exudative effusion. However, there is a small right pneumothorax visible, with the gas component at under 5% of right hemithoracic volume.  Scattered lymph nodes in the chest include an 8 mm in short axis prevascular lymph node on image 24 of series 5 and a 1 cm right paratracheal lymph node on image 36 of series 5. There is considerable passive atelectasis in both lungs. Pacer device observed.  Review of the MIP  images confirms the above findings.  CTA ABDOMEN AND PELVIS FINDINGS  The celiac trunk, SMA, and IMA appear patent. Mild intimal thickening and faint atherosclerotic calcification distally in the abdominal aorta intra and in the iliac vessels. There 2 renal arteries present bilaterally, which appear patent. No aneurysm or dissection observed.  Abnormal hypodense lesion in the left hepatic lobe measuring 1.4 by 1.0 cm on image 86 of series 5, nonspecific. Similar lesion anteriorly in the left hepatic lobe, 1.0 by 0.8 cm on image 92 of series 5. These are similar to 03/08/2012 equivocal nodularity of the liver margin.  The spleen, pancreas, and adrenal glands appear unremarkable. No pathologic upper abdominal adenopathy is observed. No pathologic pelvic adenopathy is observed. Left mid kidney 4.4 by 4.1 cm cyst, image 117 of series 5. Right kidney lower pole scar.  No dilated bowel.  Appendix normal.  No free pelvic fluid.  Mildly prominent prostate gland 5.1 by 4.8 cm with some lower central calcifications.  Review of the MIP images confirms the above findings.  IMPRESSION: 1. No dissection or pulmonary embolus identified. 2. Large right pleural effusion with small pneumothorax component (hydropneumothorax).  Moderate left pleural effusion. Scattered atelectasis in both lungs, mostly passive. 3. Enhancing nodule in the left upper lobe measuring 1.5 by 1.2 cm. This has increased from the previous reported size of 0.5 cm on 04/14/2011. Appearance is now concerning for lung cancer. Biopsy recommended when clinically feasible. 4. 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 5. Several hypodense lesions in the liver are similar to the 03/08/2012 exam and accordingly probably benign. 6. Left mid kidney cyst and the right kidney lower pole scarring.   Electronically Signed   By: Herbie BaltimoreWalt  Liebkemann M.D.   On: 05/24/2014 16:42   Ct Angio Abd/pel W/ And/or  W/o  05/24/2014   CLINICAL DATA:  Acute back pain. Ischemic cardiomyopathy. Shortness of breath. Productive cough.  Cyst more atrial fibrillation, on anti coagulation. Worsening shortness of breath. Possible sepsis.  EXAM: CT ANGIOGRAPHY CHEST, ABDOMEN AND PELVIS  TECHNIQUE: Multidetector CT imaging through the chest, abdomen and pelvis was performed using the standard protocol during bolus administration of intravenous contrast. Multiplanar reconstructed images and MIPs were obtained and reviewed to evaluate the vascular anatomy.  CONTRAST:  100 cc Omnipaque 350  COMPARISON:  None.  FINDINGS: CTA CHEST FINDINGS  Initial noncontrast CT images of the chest demonstrate no findings of acute intramural hematoma in the aorta.  Dissection protocol systemic arterial phase images through the chest demonstrate only mild atherosclerotic calcification in the aorta. coronary artery stents and calcification noted. Cardiomegaly involving all 4 chambers. 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 Thinning of the left ventricular apex with a small pseudoaneurysm of the LV apex shown on images 75 through 80 of series 5 in the vicinity of the infarct marked.  There is enough contrast in the pulmonary arterial vasculature to indicate a low likelihood of pulmonary embolus although the pulmonary embolus protocol was not specifically obtained.  A 1.5 by 1.2 cm pleural-based left upper lobe nodule is present posteriorly on image 23 of series 5. Comparing pre and post-contrast images this nodule is clearly enhancing.  Large right and moderate left pleural effusions. No separate enhancing pleural mass is visible nor is there significant pleural enhancement to specifically indicate an exudative effusion. However, there is a small right pneumothorax visible, with the gas component at under 5% of right hemithoracic volume.  Scattered lymph nodes in the chest include  an 8 mm in short axis prevascular lymph node on image 24 of series 5 and a 1 cm right paratracheal lymph node on image 36 of series 5. There is considerable passive atelectasis in both lungs. Pacer device observed.  Review of the MIP images confirms the above findings.  CTA ABDOMEN AND PELVIS FINDINGS  The celiac trunk, SMA, and IMA appear patent. Mild intimal thickening and faint atherosclerotic calcification distally in the abdominal aorta intra and in the iliac vessels. There 2 renal arteries present bilaterally, which appear patent. No aneurysm or dissection observed.  Abnormal hypodense lesion in the left hepatic lobe measuring 1.4 by 1.0 cm on image 86 of series 5, nonspecific. Similar lesion anteriorly in the left hepatic lobe, 1.0 by 0.8 cm on image 92 of series 5. These are similar to 03/08/2012 equivocal nodularity of the liver margin.  The spleen, pancreas, and adrenal glands appear unremarkable. No pathologic upper abdominal adenopathy is observed. No pathologic pelvic adenopathy is observed. Left mid kidney 4.4 by 4.1 cm cyst, image 117 of series 5.  Right kidney lower pole scar.  No dilated bowel.  Appendix normal.  No free pelvic fluid.  Mildly prominent prostate gland 5.1 by 4.8 cm with some lower central calcifications.  Review of the MIP images confirms the above findings.  IMPRESSION: 1. No dissection or pulmonary embolus identified. 2. Large right pleural effusion with small pneumothorax component (hydropneumothorax). Moderate left pleural effusion. Scattered atelectasis in both lungs, mostly passive. 3. Enhancing nodule in the left upper lobe measuring 1.5 by 1.2 cm. This has increased from the previous reported size of 0.5 cm on 04/14/2011. Appearance is now concerning for lung cancer. Biopsy recommended when clinically feasible. 4. 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 5. Several hypodense lesions in the liver are similar to  the 03/08/2012 exam and accordingly probably benign. 6. Left mid kidney cyst and the right kidney lower pole scarring.   Electronically Signed   By: Herbie BaltimoreWalt  Liebkemann M.D.   On: 05/24/2014 16:42   ASSESSMENT / PLAN:  Bilateral pleural effusions R>L likely 2nd to decompensated CHF Small R pneumothorax COPD with acute exacerbation - Supplemental O2 to maintain SpO2 greater than 92% - Follow daily CXR - Plan on thoracentesis in AM - Diurese as below  - Scheduled nebulized Atrovent  - Solumedrol 40mg  BID  Acute on chronic systolic CHF Echo current from 07/2013. Atrial fibrillation with RVR on rivaroxaban, has failed cardioversion several times.  Hypotension H/o CAD - troponin negative x 2 in ED - Rate control for Afib via amiodarone drip since dilt made hypotensive - IV lopressor PRN to maintain HR < 120 BPM - Increase furosemide gtt to 8 mg/hr - Daily Bmet, replace electrolytes as indicated - KVO IVF - Hold rivaroxaban for thora in AM - Holding preadmission losartan, metoprolol, torsemide - Dig loaded per cards - Consult cardiology appreciated  VTE ppx: heparin SQ SUP: PO pantoprazole Diet: Low sodium, hearth healthy  Care during the described time interval was provided by me and/or other providers on the critical care team.  I have reviewed this patient's available data, including medical history, events of note, physical examination and test results as part of my evaluation  Alyson ReedyWesam G. Yacoub, M.D. [facility]. Pager: [phone]. After hours pager: [phone].

## 2014-05-25 NOTE — Progress Notes (Signed)
Subjective:  Admitted with CHF/Afib and CP. Currently CP free  Objective:  Temp:  [97.3 F (36.3 C)-98.3 F (36.8 C)] 98.2 F (36.8 C) (10/29 0818) Pulse Rate:  [25-143] 110 (10/29 0700) Resp:  [17-28] 19 (10/29 0700) BP: (77-117)/(42-89) 101/65 mmHg (10/29 0700) SpO2:  [87 %-100 %] 98 % (10/29 0818) Weight:  [197 lb 8.5 oz (89.6 kg)-198 lb 13.7 oz (90.2 kg)] 198 lb 13.7 oz (90.2 kg) (10/29 0400) Weight change:   Intake/Output from previous day: 10/28 0701 - 10/29 0700 In: 745.8 [I.V.:635.8; IV Piggyback:110] Out: 945 [Urine:945]  Intake/Output from this shift:    Physical Exam: General appearance: alert and no distress Neck: no adenopathy, no carotid bruit, supple, symmetrical, trachea midline, thyroid not enlarged, symmetric, no tenderness/mass/nodules and Moderate JVD Lungs: Decreased BS Right base Heart: irregularly irregular rhythm Extremities: Trace- 1+ edema  Lab Results: Results for orders placed during the hospital encounter of 05/24/14 (from the past 48 hour(s))  CBC WITH DIFFERENTIAL     Status: Abnormal   Collection Time    05/24/14 10:01 AM      Result Value Ref Range   WBC 12.1 (*) 4.0 - 10.5 K/uL   RBC 4.95  4.22 - 5.81 MIL/uL   Hemoglobin 15.8  13.0 - 17.0 g/dL   HCT 49.0  39.0 - 52.0 %   MCV 99.0  78.0 - 100.0 fL   MCH 31.9  26.0 - 34.0 pg   MCHC 32.2  30.0 - 36.0 g/dL   RDW 14.0  11.5 - 15.5 %   Platelets 209  150 - 400 K/uL   Neutrophils Relative % 80 (*) 43 - 77 %   Neutro Abs 9.7 (*) 1.7 - 7.7 K/uL   Lymphocytes Relative 8 (*) 12 - 46 %   Lymphs Abs 1.0  0.7 - 4.0 K/uL   Monocytes Relative 9  3 - 12 %   Monocytes Absolute 1.1 (*) 0.1 - 1.0 K/uL   Eosinophils Relative 3  0 - 5 %   Eosinophils Absolute 0.3  0.0 - 0.7 K/uL   Basophils Relative 0  0 - 1 %   Basophils Absolute 0.0  0.0 - 0.1 K/uL  BASIC METABOLIC PANEL     Status: Abnormal   Collection Time    05/24/14 10:01 AM      Result Value Ref Range   Sodium 144  137 - 147  mEq/L   Potassium 4.4  3.7 - 5.3 mEq/L   Chloride 100  96 - 112 mEq/L   CO2 36 (*) 19 - 32 mEq/L   Glucose, Bld 114 (*) 70 - 99 mg/dL   BUN 25 (*) 6 - 23 mg/dL   Creatinine, Ser 0.83  0.50 - 1.35 mg/dL   Calcium 9.5  8.4 - 10.5 mg/dL   GFR calc non Af Amer >90  >90 mL/min   GFR calc Af Amer >90  >90 mL/min   Comment: (NOTE)     The eGFR has been calculated using the CKD EPI equation.     This calculation has not been validated in all clinical situations.     eGFR's persistently <90 mL/min signify possible Chronic Kidney     Disease.   Anion gap 8  5 - 15  PRO B NATRIURETIC PEPTIDE     Status: Abnormal   Collection Time    05/24/14 10:01 AM      Result Value Ref Range   Pro B Natriuretic peptide (BNP) 2785.0 (*)  0 - 125 pg/mL  TROPONIN I     Status: None   Collection Time    05/24/14 10:01 AM      Result Value Ref Range   Troponin I <0.30  <0.30 ng/mL   Comment:            Due to the release kinetics of cTnI,     a negative result within the first hours     of the onset of symptoms does not rule out     myocardial infarction with certainty.     If myocardial infarction is still suspected,     repeat the test at appropriate intervals.  DIGOXIN LEVEL     Status: None   Collection Time    05/24/14  3:39 PM      Result Value Ref Range   Digoxin Level 1.0  0.8 - 2.0 ng/mL  TROPONIN I     Status: None   Collection Time    05/24/14  3:39 PM      Result Value Ref Range   Troponin I <0.30  <0.30 ng/mL   Comment:            Due to the release kinetics of cTnI,     a negative result within the first hours     of the onset of symptoms does not rule out     myocardial infarction with certainty.     If myocardial infarction is still suspected,     repeat the test at appropriate intervals.  LACTIC ACID, PLASMA     Status: None   Collection Time    05/24/14  3:39 PM      Result Value Ref Range   Lactic Acid, Venous 1.2  0.5 - 2.2 mmol/L  CULTURE, BLOOD (ROUTINE X 2)     Status:  None   Collection Time    05/24/14  4:21 PM      Result Value Ref Range   Specimen Description BLOOD RIGHT HAND     Special Requests       Value: BOTTLES DRAWN AEROBIC AND ANAEROBIC 10CC PT ON ROCEPHIN ZITHROMAX   Culture  Setup Time       Value: 05/24/2014 22:41     Performed at [facility]   Culture       Value:        BLOOD CULTURE RECEIVED NO GROWTH TO DATE CULTURE WILL BE HELD FOR 5 DAYS BEFORE ISSUING A FINAL NEGATIVE REPORT     Performed at [facility]   Report Status PENDING    CULTURE, BLOOD (ROUTINE X 2)     Status: None   Collection Time    05/24/14  4:34 PM      Result Value Ref Range   Specimen Description BLOOD LEFT HAND     Special Requests       Value: BOTTLES DRAWN AEROBIC AND ANAEROBIC 10CC PT ON ROCEPHIN ZITHROMAX   Culture  Setup Time       Value: 05/24/2014 22:40     Performed at [facility]   Culture       Value:        BLOOD CULTURE RECEIVED NO GROWTH TO DATE CULTURE WILL BE HELD FOR 5 DAYS BEFORE ISSUING A FINAL NEGATIVE REPORT     Performed at [facility]   Report Status PENDING    MRSA PCR SCREENING     Status: None   Collection Time    05/24/14  7:16 PM      Result Value Ref Range   MRSA by PCR NEGATIVE  NEGATIVE   Comment:            The GeneXpert MRSA Assay (FDA     approved for NASAL specimens     only), is one component of a     comprehensive MRSA colonization     surveillance program. It is not     intended to diagnose MRSA     infection nor to guide or     monitor treatment for     MRSA infections.  CBC     Status: Abnormal   Collection Time    05/24/14  8:19 PM      Result Value Ref Range   WBC 12.9 (*) 4.0 - 10.5 K/uL   RBC 4.85  4.22 - 5.81 MIL/uL   Hemoglobin 15.6  13.0 - 17.0 g/dL   HCT 49.1  39.0 - 52.0 %   MCV 101.2 (*) 78.0 - 100.0 fL   MCH 32.2  26.0 - 34.0 pg   MCHC 31.8  30.0 - 36.0 g/dL   RDW 14.1  11.5 - 15.5 %   Platelets 207  150 - 400 K/uL  CREATININE, SERUM     Status:  Abnormal   Collection Time    05/24/14  8:19 PM      Result Value Ref Range   Creatinine, Ser 1.05  0.50 - 1.35 mg/dL   GFR calc non Af Amer 73 (*) >90 mL/min   GFR calc Af Amer 85 (*) >90 mL/min   Comment: (NOTE)     The eGFR has been calculated using the CKD EPI equation.     This calculation has not been validated in all clinical situations.     eGFR's persistently <90 mL/min signify possible Chronic Kidney     Disease.  CBC     Status: Abnormal   Collection Time    05/25/14  2:30 AM      Result Value Ref Range   WBC 11.6 (*) 4.0 - 10.5 K/uL   RBC 4.87  4.22 - 5.81 MIL/uL   Hemoglobin 15.3  13.0 - 17.0 g/dL   HCT 49.8  39.0 - 52.0 %   MCV 102.3 (*) 78.0 - 100.0 fL   MCH 31.4  26.0 - 34.0 pg   MCHC 30.7  30.0 - 36.0 g/dL   RDW 14.2  11.5 - 15.5 %   Platelets 214  150 - 400 K/uL  BASIC METABOLIC PANEL     Status: Abnormal   Collection Time    05/25/14  2:30 AM      Result Value Ref Range   Sodium 140  137 - 147 mEq/L   Potassium 5.5 (*) 3.7 - 5.3 mEq/L   Chloride 98  96 - 112 mEq/L   CO2 32  19 - 32 mEq/L   Glucose, Bld 129 (*) 70 - 99 mg/dL   BUN 32 (*) 6 - 23 mg/dL   Creatinine, Ser 1.28  0.50 - 1.35 mg/dL   Calcium 9.7  8.4 - 10.5 mg/dL   GFR calc non Af Amer 58 (*) >90 mL/min   GFR calc Af Amer 67 (*) >90 mL/min   Comment: (NOTE)     The eGFR has been calculated using the CKD EPI equation.     This calculation has not been validated in all clinical situations.     eGFR's persistently <90 mL/min signify possible Chronic Kidney  Disease.   Anion gap 10  5 - 15  MAGNESIUM     Status: None   Collection Time    05/25/14  2:30 AM      Result Value Ref Range   Magnesium 2.1  1.5 - 2.5 mg/dL  PHOSPHORUS     Status: Abnormal   Collection Time    05/25/14  2:30 AM      Result Value Ref Range   Phosphorus 5.7 (*) 2.3 - 4.6 mg/dL    Imaging: Imaging results have been reviewed  Tele- Afib with CVR   Assessment/Plan:   1. Active Problems: 2.   Acute on  chronic systolic CHF (congestive heart failure) 3.   Decompensated COPD with exacerbation (chronic obstructive pulmonary disease) 4.   Atrial fibrillation with rapid ventricular response 5.   Time Spent Directly with Patient:  20 minutes  Length of Stay:  LOS: 1 day   1. ISCM- Last cath 2008. H/O prior intervention. EF 20 % by 2D earlier this year. He has an ICD followed by Dr.Taylor.He was admitted with CP/increasing SOB. He has a large right pleural effusion which will probably need to be tapped under US guidance. On iv lasix drip. Other meds on hold (BB and ARB secondary to hypotension). Will have CHF service see as well.  2. Afib with RVR- Was on BB and Xarelto (on hold). Loaded with Dig. HR improving. He became hypotensive with IV dilt. On amio drip as well. BP too low for CCB/BB at this time.  3. Tobacco abuse.  4. COPD- On home O2  [name] 05/25/2014, 11:09 AM

## 2014-05-26 ENCOUNTER — Inpatient Hospital Stay (HOSPITAL_COMMUNITY): Payer: Medicare Other

## 2014-05-26 LAB — CBC
HCT: 45.1 % (ref 39.0–52.0)
Hemoglobin: 14.4 g/dL (ref 13.0–17.0)
MCH: 31 pg (ref 26.0–34.0)
MCHC: 31.9 g/dL (ref 30.0–36.0)
MCV: 97.2 fL (ref 78.0–100.0)
Platelets: 169 10*3/uL (ref 150–400)
RBC: 4.64 MIL/uL (ref 4.22–5.81)
RDW: 13.5 % (ref 11.5–15.5)
WBC: 15 10*3/uL — ABNORMAL HIGH (ref 4.0–10.5)

## 2014-05-26 LAB — BASIC METABOLIC PANEL
Anion gap: 10 (ref 5–15)
BUN: 28 mg/dL — ABNORMAL HIGH (ref 6–23)
CALCIUM: 9.1 mg/dL (ref 8.4–10.5)
CO2: 32 mEq/L (ref 19–32)
Chloride: 95 mEq/L — ABNORMAL LOW (ref 96–112)
Creatinine, Ser: 0.95 mg/dL (ref 0.50–1.35)
GFR, EST NON AFRICAN AMERICAN: 86 mL/min — AB (ref >90)
GLUCOSE: 203 mg/dL — AB (ref 70–99)
POTASSIUM: 4.7 meq/L (ref 3.7–5.3)
SODIUM: 137 meq/L (ref 137–147)

## 2014-05-26 LAB — PROTIME-INR
INR: 1.45 (ref 0.00–1.49)
Prothrombin Time: 17.8 seconds — ABNORMAL HIGH (ref 11.6–15.2)

## 2014-05-26 LAB — PHOSPHORUS: Phosphorus: 2.4 mg/dL (ref 2.3–4.6)

## 2014-05-26 LAB — MAGNESIUM: MAGNESIUM: 2.1 mg/dL (ref 1.5–2.5)

## 2014-05-26 MED ORDER — DIGOXIN 0.0625 MG HALF TABLET
0.0625 mg | ORAL_TABLET | Freq: Every day | ORAL | Status: DC
  Administered 2014-05-28 – 2014-06-02 (×6): 0.0625 mg via ORAL
  Filled 2014-05-26 (×6): qty 1

## 2014-05-26 MED ORDER — LOSARTAN POTASSIUM 25 MG PO TABS
25.0000 mg | ORAL_TABLET | Freq: Every day | ORAL | Status: DC
  Administered 2014-05-26 – 2014-05-28 (×3): 25 mg via ORAL
  Filled 2014-05-26 (×3): qty 1

## 2014-05-26 MED ORDER — FUROSEMIDE 10 MG/ML IJ SOLN
40.0000 mg | Freq: Three times a day (TID) | INTRAMUSCULAR | Status: AC
  Administered 2014-05-26: 40 mg via INTRAVENOUS
  Filled 2014-05-26 (×2): qty 4

## 2014-05-26 NOTE — Progress Notes (Signed)
Patient's 2000 dose of Lasix held tonight due to low B/P's. Lopressor given and b/ps responded to the previous dose. Will continue to monitor closely.

## 2014-05-26 NOTE — H&P (Signed)
PULMONARY / CRITICAL CARE MEDICINE   Name: [name] LemonsReuben S Keeter MRN: [id number] DOB: 16-Sep-1950    ADMISSION DATE:  05/24/2014 CONSULTATION DATE:  05/24/2014  REFERRING MD :  EDP  CHIEF COMPLAINT:  SOB  INITIAL PRESENTATION: 63 year old male with history of CHF (LVEF 20-25%), COPD, [facility] c/o SOB with chest and upper back pain. CT chest showed bilateral effusion R>L and small R pneumothorax. PCCM consulted for admission.  STUDIES:  10/28 CTA chest > Bilateral effusion, R>L. Small R PTX. LUL nodule.   SIGNIFICANT EVENTS:  SUBJECTIVE: No events overnight, respiratory status improved.  VITAL SIGNS: Temp:  [97.7 F (36.5 C)-98.3 F (36.8 C)] 98.1 F (36.7 C) (10/30 0830) Pulse Rate:  [58-120] 103 (10/30 1010) Resp:  [20-27] 21 (10/30 0900) BP: (82-121)/(49-78) 97/60 mmHg (10/30 0900) SpO2:  [93 %-98 %] 97 % (10/30 0900) Weight:  [90 kg (198 lb 6.6 oz)] 90 kg (198 lb 6.6 oz) (10/30 0308) HEMODYNAMICS:   VENTILATOR SETTINGS:   INTAKE / OUTPUT:  Intake/Output Summary (Last 24 hours) at 05/26/14 1031 Last data filed at 05/26/14 0900  Gross per 24 hour  Intake  577.9 ml  Output   4950 ml  Net -4372.1 ml    PHYSICAL EXAMINATION: General:  Male in no acute distress Neuro:  Alert, oriented x 3. Moving all ext to commands HEENT:  [place]/AT, JVD difficult to assess due to neck girth.  Cardiovascular:  Tachy, irregularly irregular.  Lungs:  Coarse bilateral breath sounds. Diminished bilateral bases. Abdomen:  Obese, soft, non-tender.  Musculoskeletal:  No acute deformity or ROM limitation. +2 pitting edema to BLE  Skin:  Intact, warm  LABS:  CBC  Recent Labs Lab 05/24/14 2019 05/25/14 0230 05/26/14 0215  WBC 12.9* 11.6* 15.0*  HGB 15.6 15.3 14.4  HCT 49.1 49.8 45.1  PLT 207 214 169   Coag's  Recent Labs Lab 05/26/14 0215  INR 1.45   BMET  Recent Labs Lab 05/24/14 1001 05/24/14 2019 05/25/14 0230 05/26/14 0215  NA 144  --  140 137  K 4.4   --  5.5* 4.7  CL 100  --  98 95*  CO2 36*  --  32 32  BUN 25*  --  32* 28*  CREATININE 0.83 1.05 1.28 0.95  GLUCOSE 114*  --  129* 203*   Electrolytes  Recent Labs Lab 05/24/14 1001 05/25/14 0230 05/26/14 0215  CALCIUM 9.5 9.7 9.1  MG  --  2.1 2.1  PHOS  --  5.7* 2.4   Sepsis Markers  Recent Labs Lab 05/24/14 1539  LATICACIDVEN 1.2   ABG No results found for this basename: PHART, PCO2ART, PO2ART,  in the last 168 hours Liver Enzymes No results found for this basename: AST, ALT, ALKPHOS, BILITOT, ALBUMIN,  in the last 168 hours Cardiac Enzymes  Recent Labs Lab 05/24/14 1001 05/24/14 1539  TROPONINI <0.30 <0.30  PROBNP 2785.0*  --    Glucose No results found for this basename: GLUCAP,  in the last 168 hours  Imaging Dg Chest Port 1 View  05/25/2014   CLINICAL DATA:  63 year old male with shortness of breath. Pleural effusion. Ischemic cardiomyopathy. Atrial fibrillation. Subsequent encounter.  EXAM: PORTABLE CHEST - 1 VIEW  COMPARISON:  05/24/2014 CT and chest x-ray  FINDINGS: Large right pleural effusion remains. The CT detected small right-sided pneumothorax is not as well delineated on the present plain film examination. This may be secondary to the small size of this pneumothorax an medial  location.  CT detected left-sided pleural effusion not as well appreciated on present chest x-ray.  Enhancing left lower lobe nodule noted on CT not adequately assessed on the present plain film exam.  Basilar atelectatic changes.  Pulmonary vascular congestion.  AICD/biventricular pacer is in place with cardiomegaly.  Calcified aorta.  IMPRESSION: Large right pleural effusion remains. The CT detected small right-sided pneumothorax is not as well delineated on the present plain film examination. This may be secondary to the small size of this pneumothorax and medial location.  CT detected left-sided pleural effusion not as well appreciated on present chest x-ray.  Enhancing left lower  lobe nodule noted on CT not adequately assessed on the present plain film exam.  Basilar atelectatic changes.  Pulmonary vascular congestion.  AICD/biventricular pacer is in place with cardiomegaly.   Electronically Signed   By: Bridgett LarssonSteve  Olson M.D.   On: 05/25/2014 07:56   ASSESSMENT / PLAN:  Bilateral pleural effusions R>L likely 2nd to decompensated CHF Small R pneumothorax COPD with acute exacerbation - Supplemental O2 to maintain SpO2 greater than 92% - Follow daily CXR - Plan on thoracentesis today - Diurese as below  - Scheduled nebulized Atrovent  - Solumedrol 40mg  BID  Acute on chronic systolic CHF Echo current from 07/2013. Atrial fibrillation with RVR on rivaroxaban, has failed cardioversion several times.  Hypotension H/o CAD - troponin negative x 2 in ED - Rate control for Afib via amiodarone drip since dilt made hypotensive - IV lopressor PRN to maintain HR < 120 BPM - D/C lasix drip, will change to 40 mg IV q8 x2 doses then dose on a daily bases after - Daily Bmet, replace electrolytes as indicated - KVO IVF - Hold rivaroxaban for thora today then may restart in AM - Holding preadmission losartan, metoprolol, torsemide - Dig loaded per cards - Consult cardiology appreciated  Will thora today, change lasix to pushes, [facility], PCCM will see again on Monday on consult.  VTE ppx: heparin SQ SUP: PO pantoprazole Diet: Low sodium, hearth healthy  Care during the described time interval was provided by me and/or other providers on the critical care team.  I have reviewed this patient's available data, including medical history, events of note, physical examination and test results as part of my evaluation  Alyson ReedyWesam G. Yacoub, M.D. [facility]monary/Critical Care Medicine. Pager: [phone]. After hours pager: [phone].

## 2014-05-26 NOTE — Progress Notes (Signed)
Advanced Heart Failure Rounding Note   Subjective:    63 year old male with history of CHF (LVEF 20-25% - echo 1/15), chronic atrial fib (failed amio and DC-CV), COPD, [facility] c/o SOB with chest and upper back pain. CT chest showed bilateral effusion R>L and small R pneumothorax. Felt to have combination of CHF and COPD flare.   Diuresed well overnight on lasix gtt -3L but weight unchanged. Renal function stable CCM pending R thoracentesis today. Breathing much better. AF rate 110 on amio gtt.    Objective:   Weight Range:  Vital Signs:   Temp:  [97.7 F (36.5 C)-98.3 F (36.8 C)] 97.9 F (36.6 C) (10/30 0730) Pulse Rate:  [58-116] 58 (10/30 0800) Resp:  [20-27] 23 (10/30 0800) BP: (82-121)/(49-78) 115/77 mmHg (10/30 0800) SpO2:  [93 %-98 %] 98 % (10/30 0826) Weight:  [90 kg (198 lb 6.6 oz)] 90 kg (198 lb 6.6 oz) (10/30 0308) Last BM Date: 05/24/14  Weight change: Filed Weights   05/24/14 1916 05/25/14 0400 05/26/14 0308  Weight: 89.6 kg (197 lb 8.5 oz) 90.2 kg (198 lb 13.7 oz) 90 kg (198 lb 6.6 oz)    Intake/Output:   Intake/Output Summary (Last 24 hours) at 05/26/14 0851 Last data filed at 05/26/14 0830  Gross per 24 hour  Intake  504.7 ml  Output   4000 ml  Net -3495.3 ml     Physical Exam: General:  Lying in bed. No resp difficulty HEENT: normal Neck: supple. JVP to jaw. Carotids 2+ bilat; no bruits. No lymphadenopathy or thryomegaly appreciated. Cor: PMI laterally displaced. IRR, IRR tachy Lungs: clear decreased 1/3 up on R Abdomen: obese soft, nontender, + distended. No hepatosplenomegaly. No bruits or masses. Good bowel sounds. Extremities: no cyanosis, clubbing, rash, 2+ edema Neuro: alert & orientedx3, cranial nerves grossly intact. moves all 4 extremities w/o difficulty. Affect pleasant  Telemetry: AF 110s  Labs: Basic Metabolic Panel:  Recent Labs Lab 05/24/14 1001 05/24/14 2019 05/25/14 0230 05/26/14 0215  NA 144  --  140 137   K 4.4  --  5.5* 4.7  CL 100  --  98 95*  CO2 36*  --  32 32  GLUCOSE 114*  --  129* 203*  BUN 25*  --  32* 28*  CREATININE 0.83 1.05 1.28 0.95  CALCIUM 9.5  --  9.7 9.1  MG  --   --  2.1 2.1  PHOS  --   --  5.7* 2.4    Liver Function Tests: No results found for this basename: AST, ALT, ALKPHOS, BILITOT, PROT, ALBUMIN,  in the last 168 hours No results found for this basename: LIPASE, AMYLASE,  in the last 168 hours No results found for this basename: AMMONIA,  in the last 168 hours  CBC:  Recent Labs Lab 05/24/14 1001 05/24/14 2019 05/25/14 0230 05/26/14 0215  WBC 12.1* 12.9* 11.6* 15.0*  NEUTROABS 9.7*  --   --   --   HGB 15.8 15.6 15.3 14.4  HCT 49.0 49.1 49.8 45.1  MCV 99.0 101.2* 102.3* 97.2  PLT 209 207 214 169    Cardiac Enzymes:  Recent Labs Lab 05/24/14 1001 05/24/14 1539  TROPONINI <0.30 <0.30    BNP: BNP (last 3 results)  Recent Labs  08/20/13 1539 05/24/14 1001  PROBNP 2069.0* 2785.0*     Other results:  Imaging: Dg Chest Port 1 View  05/26/2014   CLINICAL DATA:  Pleural effusion.  EXAM: PORTABLE CHEST -  1 VIEW  COMPARISON:  05/25/2014  FINDINGS: Right pacer remains in place, unchanged. There is cardiomegaly. Vascular congestion. Probable bilateral pleural effusions, right greater than left. Diffuse right lung airspace disease. Mild interstitial and airspace opacities on the left, slightly increased since prior study. Findings could represent asymmetric edema or infection.  IMPRESSION: Bilateral airspace disease, right greater than left. Airspace disease has slightly increased on the left since prior study. This could represent asymmetric edema or infection.  Small to moderate right pleural effusion. Suspect small left pleural effusion.   Electronically Signed   By: Charlett Nose M.D.   On: 05/26/2014 07:46   Dg Chest Port 1 View  05/25/2014   CLINICAL DATA:  63 year old male with shortness of breath. Pleural effusion. Ischemic cardiomyopathy.  Atrial fibrillation. Subsequent encounter.  EXAM: PORTABLE CHEST - 1 VIEW  COMPARISON:  05/24/2014 CT and chest x-ray  FINDINGS: Large right pleural effusion remains. The CT detected small right-sided pneumothorax is not as well delineated on the present plain film examination. This may be secondary to the small size of this pneumothorax an medial location.  CT detected left-sided pleural effusion not as well appreciated on present chest x-ray.  Enhancing left lower lobe nodule noted on CT not adequately assessed on the present plain film exam.  Basilar atelectatic changes.  Pulmonary vascular congestion.  AICD/biventricular pacer is in place with cardiomegaly.  Calcified aorta.  IMPRESSION: Large right pleural effusion remains. The CT detected small right-sided pneumothorax is not as well delineated on the present plain film examination. This may be secondary to the small size of this pneumothorax and medial location.  CT detected left-sided pleural effusion not as well appreciated on present chest x-ray.  Enhancing left lower lobe nodule noted on CT not adequately assessed on the present plain film exam.  Basilar atelectatic changes.  Pulmonary vascular congestion.  AICD/biventricular pacer is in place with cardiomegaly.   Electronically Signed   By: Bridgett Larsson M.D.   On: 05/25/2014 07:56   Dg Chest Portable 1 View  05/24/2014   CLINICAL DATA:  Shortness of breath.  EXAM: PORTABLE CHEST - 1 VIEW  COMPARISON:  August 20, 2013.  FINDINGS: Stable cardiomegaly. Right-sided PICC line is unchanged in position. Stable interstitial densities are noted throughout both lungs consistent with scarring or possibly superimposed pulmonary edema. No pneumothorax is noted. Increased right basilar opacity is noted concerning for pneumonia or atelectasis with associated pleural effusion.  IMPRESSION: Stable interstitial densities are noted throughout both lungs which may simply represent chronic scarring, but superimposed  edema cannot be excluded. Increased right basilar opacity is noted concerning for worsening pneumonia or atelectasis with associated pleural effusion.   Electronically Signed   By: Roque Lias M.D.   On: 05/24/2014 10:02   Ct Angio Chest Aorta W/cm &/or Wo/cm  05/24/2014   CLINICAL DATA:  Acute back pain. Ischemic cardiomyopathy. Shortness of breath. Productive cough.  Cyst more atrial fibrillation, on anti coagulation. Worsening shortness of breath. Possible sepsis.  EXAM: CT ANGIOGRAPHY CHEST, ABDOMEN AND PELVIS  TECHNIQUE: Multidetector CT imaging through the chest, abdomen and pelvis was performed using the standard protocol during bolus administration of intravenous contrast. Multiplanar reconstructed images and MIPs were obtained and reviewed to evaluate the vascular anatomy.  CONTRAST:  100 cc Omnipaque 350  COMPARISON:  None.  FINDINGS: CTA CHEST FINDINGS  Initial noncontrast CT images of the chest demonstrate no findings of acute intramural hematoma in the aorta.  Dissection protocol systemic arterial phase images through  the chest demonstrate only mild atherosclerotic calcification in the aorta. coronary artery stents and calcification noted. Cardiomegaly involving all 4 chambers. 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 Thinning of the left ventricular apex with a small pseudoaneurysm of the LV apex shown on images 75 through 80 of series 5 in the vicinity of the infarct marked.  There is enough contrast in the pulmonary arterial vasculature to indicate a low likelihood of pulmonary embolus although the pulmonary embolus protocol was not specifically obtained.  A 1.5 by 1.2 cm pleural-based left upper lobe nodule is present posteriorly on image 23 of series 5. Comparing pre and post-contrast images this nodule is clearly enhancing.  Large right and moderate left pleural effusions. No separate enhancing pleural mass is  visible nor is there significant pleural enhancement to specifically indicate an exudative effusion. However, there is a small right pneumothorax visible, with the gas component at under 5% of right hemithoracic volume.  Scattered lymph nodes in the chest include an 8 mm in short axis prevascular lymph node on image 24 of series 5 and a 1 cm right paratracheal lymph node on image 36 of series 5. There is considerable passive atelectasis in both lungs. Pacer device observed.  Review of the MIP images confirms the above findings.  CTA ABDOMEN AND PELVIS FINDINGS  The celiac trunk, SMA, and IMA appear patent. Mild intimal thickening and faint atherosclerotic calcification distally in the abdominal aorta intra and in the iliac vessels. There 2 renal arteries present bilaterally, which appear patent. No aneurysm or dissection observed.  Abnormal hypodense lesion in the left hepatic lobe measuring 1.4 by 1.0 cm on image 86 of series 5, nonspecific. Similar lesion anteriorly in the left hepatic lobe, 1.0 by 0.8 cm on image 92 of series 5. These are similar to 03/08/2012 equivocal nodularity of the liver margin.  The spleen, pancreas, and adrenal glands appear unremarkable. No pathologic upper abdominal adenopathy is observed. No pathologic pelvic adenopathy is observed. Left mid kidney 4.4 by 4.1 cm cyst, image 117 of series 5. Right kidney lower pole scar.  No dilated bowel.  Appendix normal.  No free pelvic fluid.  Mildly prominent prostate gland 5.1 by 4.8 cm with some lower central calcifications.  Review of the MIP images confirms the above findings.  IMPRESSION: 1. No dissection or pulmonary embolus identified. 2. Large right pleural effusion with small pneumothorax component (hydropneumothorax). Moderate left pleural effusion. Scattered atelectasis in both lungs, mostly passive. 3. Enhancing nodule in the left upper lobe measuring 1.5 by 1.2 cm. This has increased from the previous reported size of 0.5 cm on  04/14/2011. Appearance is now concerning for lung cancer. Biopsy recommended when clinically feasible. 4. 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 5. Several hypodense lesions in the liver are similar to the 03/08/2012 exam and accordingly probably benign. 6. Left mid kidney cyst and the right kidney lower pole scarring.   Electronically Signed   By: Herbie Baltimore M.D.   On: 05/24/2014 16:42   Ct Angio Abd/pel W/ And/or W/o  05/24/2014   CLINICAL DATA:  Acute back pain. Ischemic cardiomyopathy. Shortness of breath. Productive cough.  Cyst more atrial fibrillation, on anti coagulation. Worsening shortness of breath. Possible sepsis.  EXAM: CT ANGIOGRAPHY CHEST, ABDOMEN AND PELVIS  TECHNIQUE: Multidetector CT imaging through the chest, abdomen and pelvis was performed using the standard protocol during bolus  administration of intravenous contrast. Multiplanar reconstructed images and MIPs were obtained and reviewed to evaluate the vascular anatomy.  CONTRAST:  100 cc Omnipaque 350  COMPARISON:  None.  FINDINGS: CTA CHEST FINDINGS  Initial noncontrast CT images of the chest demonstrate no findings of acute intramural hematoma in the aorta.  Dissection protocol systemic arterial phase images through the chest demonstrate only mild atherosclerotic calcification in the aorta. coronary artery stents and calcification noted. Cardiomegaly involving all 4 chambers. 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 Thinning of the left ventricular apex with a small pseudoaneurysm of the LV apex shown on images 75 through 80 of series 5 in the vicinity of the infarct marked.  There is enough contrast in the pulmonary arterial vasculature to indicate a low likelihood of pulmonary embolus although the pulmonary embolus protocol was not specifically obtained.  A 1.5 by 1.2 cm  pleural-based left upper lobe nodule is present posteriorly on image 23 of series 5. Comparing pre and post-contrast images this nodule is clearly enhancing.  Large right and moderate left pleural effusions. No separate enhancing pleural mass is visible nor is there significant pleural enhancement to specifically indicate an exudative effusion. However, there is a small right pneumothorax visible, with the gas component at under 5% of right hemithoracic volume.  Scattered lymph nodes in the chest include an 8 mm in short axis prevascular lymph node on image 24 of series 5 and a 1 cm right paratracheal lymph node on image 36 of series 5. There is considerable passive atelectasis in both lungs. Pacer device observed.  Review of the MIP images confirms the above findings.  CTA ABDOMEN AND PELVIS FINDINGS  The celiac trunk, SMA, and IMA appear patent. Mild intimal thickening and faint atherosclerotic calcification distally in the abdominal aorta intra and in the iliac vessels. There 2 renal arteries present bilaterally, which appear patent. No aneurysm or dissection observed.  Abnormal hypodense lesion in the left hepatic lobe measuring 1.4 by 1.0 cm on image 86 of series 5, nonspecific. Similar lesion anteriorly in the left hepatic lobe, 1.0 by 0.8 cm on image 92 of series 5. These are similar to 03/08/2012 equivocal nodularity of the liver margin.  The spleen, pancreas, and adrenal glands appear unremarkable. No pathologic upper abdominal adenopathy is observed. No pathologic pelvic adenopathy is observed. Left mid kidney 4.4 by 4.1 cm cyst, image 117 of series 5. Right kidney lower pole scar.  No dilated bowel.  Appendix normal.  No free pelvic fluid.  Mildly prominent prostate gland 5.1 by 4.8 cm with some lower central calcifications.  Review of the MIP images confirms the above findings.  IMPRESSION: 1. No dissection or pulmonary embolus identified. 2. Large right pleural effusion with small pneumothorax component  (hydropneumothorax). Moderate left pleural effusion. Scattered atelectasis in both lungs, mostly passive. 3. Enhancing nodule in the left upper lobe measuring 1.5 by 1.2 cm. This has increased from the previous reported size of 0.5 cm on 04/14/2011. Appearance is now concerning for lung cancer. Biopsy recommended when clinically feasible. 4. 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 5. Several hypodense lesions in the liver are similar to the 03/08/2012 exam and accordingly probably benign. 6. Left mid kidney cyst and the right kidney lower pole scarring.   Electronically Signed   By: Herbie BaltimoreWalt  Liebkemann M.D.   On: 05/24/2014 16:42      Medications:  Scheduled Medications: . antiseptic oral rinse  7 mL Mouth Rinse BID  . digoxin  0.125 mg Oral Daily  . gabapentin  300 mg Oral QHS  . heparin  5,000 Units Subcutaneous 3 times per day  . ipratropium  0.5 mg Nebulization Q4H  . levalbuterol  0.63 mg Nebulization 6 times per day  . methylPREDNISolone (SOLU-MEDROL) injection  40 mg Intravenous Q12H  . pantoprazole  40 mg Oral Daily  . sodium chloride  3 mL Intravenous Q12H     Infusions: . amiodarone 30 mg/hr (05/26/14 0400)  . diltiazem (CARDIZEM) infusion Stopped (05/24/14 1047)  . furosemide (LASIX) infusion 8 mg/hr (05/26/14 0400)     PRN Medications:  sodium chloride, sodium chloride, acetaminophen, fentaNYL, metoprolol, oxyCODONE-acetaminophen, sodium chloride   Assessment:   1. Acute on chronic respiratory failure 2. Acute/chonic systolic HF - iCM EF 20-25% 3. Chronic AF now with RVR     --on Xarelto at home 4. R pleural effusion 5. Hyperkalemia 6. CAD s/p previous stenting 7. AECOPD - on home O2  Plan/Discussion:    I think main issue here is clearly acute on chronic HF and not COPD flare. He continues with marked volume overload. Would continue with IV diuresis and also continue IV amio for rate  control. He has failed amio for rhythm management in past so hopefully we can wean off prior to discharge.   Agree with thoracentesis today for diagnostic and therapeutic purposes.   Would use heparin for AF while off Xarelto (and after thoracentesis). Continue digoxin. Resume losartan as BP tolerates. Hold b-blocker for now. Would consider changing Toprol to bisoprolol at discharge.   Would stop prednisone if OK with CCM.  Pull foley.   Length of Stay: 2   Arvilla Meres MD 05/26/2014, 8:51 AM  Advanced Heart Failure Team Pager [phone] (M-F; 7a - 4p)  Please contact [facility] for night-coverage after hours (4p -7a ) and weekends on [email or web address]

## 2014-05-27 ENCOUNTER — Inpatient Hospital Stay (HOSPITAL_COMMUNITY): Payer: Medicare Other

## 2014-05-27 LAB — BASIC METABOLIC PANEL
Anion gap: 8 (ref 5–15)
BUN: 32 mg/dL — ABNORMAL HIGH (ref 6–23)
CALCIUM: 9.5 mg/dL (ref 8.4–10.5)
CO2: 38 meq/L — AB (ref 19–32)
CREATININE: 0.82 mg/dL (ref 0.50–1.35)
Chloride: 93 mEq/L — ABNORMAL LOW (ref 96–112)
GFR calc Af Amer: >90 mL/min (ref >90)
GFR calc non Af Amer: >90 mL/min (ref >90)
Glucose, Bld: 153 mg/dL — ABNORMAL HIGH (ref 70–99)
Potassium: 4.8 mEq/L (ref 3.7–5.3)
Sodium: 139 mEq/L (ref 137–147)

## 2014-05-27 LAB — CBC
HEMATOCRIT: 46.2 % (ref 39.0–52.0)
Hemoglobin: 15 g/dL (ref 13.0–17.0)
MCH: 31.6 pg (ref 26.0–34.0)
MCHC: 32.5 g/dL (ref 30.0–36.0)
MCV: 97.3 fL (ref 78.0–100.0)
PLATELETS: 184 10*3/uL (ref 150–400)
RBC: 4.75 MIL/uL (ref 4.22–5.81)
RDW: 13.5 % (ref 11.5–15.5)
WBC: 14.5 10*3/uL — AB (ref 4.0–10.5)

## 2014-05-27 LAB — PHOSPHORUS: Phosphorus: 3.7 mg/dL (ref 2.3–4.6)

## 2014-05-27 LAB — MAGNESIUM: Magnesium: 2.4 mg/dL (ref 1.5–2.5)

## 2014-05-27 MED ORDER — BUDESONIDE-FORMOTEROL FUMARATE 160-4.5 MCG/ACT IN AERO
2.0000 | INHALATION_SPRAY | Freq: Two times a day (BID) | RESPIRATORY_TRACT | Status: DC
  Administered 2014-05-27 – 2014-06-01 (×12): 2 via RESPIRATORY_TRACT
  Filled 2014-05-27 (×2): qty 6

## 2014-05-27 MED ORDER — GABAPENTIN 300 MG PO CAPS
300.0000 mg | ORAL_CAPSULE | Freq: Every day | ORAL | Status: DC
Start: 2014-05-27 — End: 2014-06-02
  Administered 2014-05-27 – 2014-06-01 (×6): 300 mg via ORAL
  Filled 2014-05-27 (×7): qty 1

## 2014-05-27 MED ORDER — TORSEMIDE 20 MG PO TABS
20.0000 mg | ORAL_TABLET | Freq: Two times a day (BID) | ORAL | Status: DC
  Administered 2014-05-27 – 2014-05-29 (×4): 20 mg via ORAL
  Filled 2014-05-27 (×6): qty 1

## 2014-05-27 MED ORDER — LEVALBUTEROL HCL 0.63 MG/3ML IN NEBU: 0.6300 mg | INHALATION_SOLUTION | RESPIRATORY_TRACT | Status: DC | PRN

## 2014-05-27 MED ORDER — B COMPLEX-C PO TABS
1.0000 | ORAL_TABLET | Freq: Every day | ORAL | Status: DC
  Administered 2014-05-27 – 2014-06-02 (×7): 1 via ORAL
  Filled 2014-05-27 (×7): qty 1

## 2014-05-27 MED ORDER — RIVAROXABAN 20 MG PO TABS
20.0000 mg | ORAL_TABLET | Freq: Every day | ORAL | Status: DC
  Administered 2014-05-27 – 2014-06-01 (×6): 20 mg via ORAL
  Filled 2014-05-27 (×7): qty 1

## 2014-05-27 MED ORDER — BISOPROLOL FUMARATE 5 MG PO TABS
2.5000 mg | ORAL_TABLET | Freq: Every day | ORAL | Status: DC
  Administered 2014-05-27 – 2014-05-28 (×2): 2.5 mg via ORAL
  Filled 2014-05-27 (×2): qty 0.5

## 2014-05-27 NOTE — Progress Notes (Signed)
[facility] TEAM 1 - Stepdown/ICU TEAM Progress Note  [name] WUJ:[id number] DOB: October 26, 1950 DOA: 05/24/2014 PCP: [name],ASHISH, MD  Admit HPI / Brief Narrative: 63 year old male with PMH which includes CAD s/p several stents, CHF (LCEF 20-25% 07/2013), COPD (On home O2, followed by Dr. [name], PFTs suggestive of restrictive disease), and Afib refractory to drugs and cardioversion, on rivaroxaban who had been feeling ill for several months prior to admission. About one month prior he began having chest pain, sob, and peripheral edema. He had been severely limited at home, unable to perform ADLs without becoming profoundly dyspneic. The morning of 10/28 he called his sister to take him to ED, however he was so dyspneic she had to call EMS. In ED he was SOB and hypotensive with chest and upper back pain. He was sent for CTA to r/o aortic dissection. CT showed large bilateral effusions R>L with small R PTX. PCCM has been called for admission.  HPI/Subjective: Somewhat dyspneic laying in bed, but reports that he feels better.  Denies cp, n/v, or abdom pain.    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 [name]   LUL Lung nodule on CT chest  Awaiting comment from Pulmonary - 1.5 x 1.2cm pleural based - increased in size since Sept 2012 w/ appearance concerning for lung CA - feel bx will likely be indicated, if only for prognostic value   Parox Atrial fibrillation with acute RVR on rivaroxaban as outpt - failed cardioversion several times - rate control for Afib via amiodarone drip  since dilt made hypotensive - dig loaded per Cards - [facility] - will ask Pharm to begin heparin for now   Hypotension  Care w/ diuresis   H/o CAD troponin negative x 2 - denies cp   Code Status: FULL Family Communication: no family present at time of exam Disposition Plan: SDU  Consultants: Cardiology  PCCM  Procedures: none  Antibiotics: azithro 10/28 Rocephin 10/28  DVT prophylaxis: IV heparin    Objective: Blood pressure 91/50, pulse 25, temperature 97.7 F (36.5 C), temperature source Oral, resp. rate 24, height 5\' 7"  (1.702 m), weight 87.8 kg (193 lb 9 oz), SpO2 95.00%.  Intake/Output Summary (Last 24 hours) at 05/27/14 1129 Last data filed at 05/27/14 0800  Gross per 24 hour  Intake 461.41 ml  Output   1900 ml  Net -1438.59 ml   Exam: General: mild resp distress laying in bed - alert and conversant  Lungs: poor air movement th/o - very diminished bs in B bases - no wheeze  Cardiovascular: irreg irreg - distant hs - rate controlled  Abdomen: Nontender, nondistended, soft, bowel sounds positive, no rebound, no ascites, no appreciable mass Extremities: No significant cyanosis, clubbing;  1+ edema bilateral lower extremities  Data Reviewed: Basic Metabolic Panel:  Recent Labs Lab 05/24/14 1001 05/24/14 2019 05/25/14 0230 05/26/14 0215 05/27/14 0322  NA 144  --  140 137 139  K 4.4  --  5.5* 4.7 4.8  CL  100  --  98 95* 93*  CO2 36*  --  32 32 38*  GLUCOSE 114*  --  129* 203* 153*  BUN 25*  --  32* 28* 32*  CREATININE 0.83 1.05 1.28 0.95 0.82  CALCIUM 9.5  --  9.7 9.1 9.5  MG  --   --  2.1 2.1 2.4  PHOS  --   --  5.7* 2.4 3.7    Liver Function Tests: No results found for this basename: AST, ALT, ALKPHOS, BILITOT, PROT, ALBUMIN,  in the last 168 hours No results found for this basename: LIPASE, AMYLASE,  in the last 168 hours No results found for this basename: AMMONIA,  in the last 168  hours  Coags:  Recent Labs Lab 05/26/14 0215  INR 1.45   No results found for this basename: APTT,  in the last 168 hours  CBC:  Recent Labs Lab 05/24/14 1001 05/24/14 2019 05/25/14 0230 05/26/14 0215 05/27/14 0322  WBC 12.1* 12.9* 11.6* 15.0* 14.5*  NEUTROABS 9.7*  --   --   --   --   HGB 15.8 15.6 15.3 14.4 15.0  HCT 49.0 49.1 49.8 45.1 46.2  MCV 99.0 101.2* 102.3* 97.2 97.3  PLT 209 207 214 169 184    Cardiac Enzymes:  Recent Labs Lab 05/24/14 1001 05/24/14 1539  TROPONINI <0.30 <0.30   BNP (last 3 results)  Recent Labs  08/20/13 1539 05/24/14 1001  PROBNP 2069.0* 2785.0*    Recent Results (from the past 240 hour(s))  CULTURE, BLOOD (ROUTINE X 2)     Status: None   Collection Time    05/24/14  4:21 PM      Result Value Ref Range Status   Specimen Description BLOOD RIGHT HAND   Final   Special Requests     Final   Value: BOTTLES DRAWN AEROBIC AND ANAEROBIC 10CC PT ON ROCEPHIN ZITHROMAX   Culture  Setup Time     Final   Value: 05/24/2014 22:41     Performed at [facility]   Culture     Final   Value:        BLOOD CULTURE RECEIVED NO GROWTH TO DATE CULTURE WILL BE HELD FOR 5 DAYS BEFORE ISSUING A FINAL NEGATIVE REPORT     Performed at [facility]   Report Status PENDING   Incomplete  CULTURE, BLOOD (ROUTINE X 2)     Status: None   Collection Time    05/24/14  4:34 PM      Result Value Ref Range Status   Specimen Description BLOOD LEFT HAND   Final   Special Requests     Final   Value: BOTTLES DRAWN AEROBIC AND ANAEROBIC 10CC PT ON ROCEPHIN ZITHROMAX   Culture  Setup Time     Final   Value: 05/24/2014 22:40     Performed at [facility]   Culture     Final   Value:        BLOOD CULTURE RECEIVED NO GROWTH TO DATE CULTURE WILL BE HELD FOR 5 DAYS BEFORE ISSUING A FINAL NEGATIVE REPORT     Performed at [facility]   Report Status PENDING   Incomplete  MRSA PCR SCREENING     Status: None   Collection Time     05/24/14  7:16 PM      Result Value Ref Range Status   MRSA by PCR NEGATIVE  NEGATIVE Final   Comment:  The GeneXpert MRSA Assay (FDA     approved for NASAL specimens     only), is one component of a     comprehensive MRSA colonization     surveillance program. It is not     intended to diagnose MRSA     infection nor to guide or     monitor treatment for     MRSA infections.     Studies:  Recent x-ray studies have been reviewed in detail by the Attending Physician  Scheduled Meds:  Scheduled Meds: . antiseptic oral rinse  7 mL Mouth Rinse BID  . [START ON 05/28/2014] digoxin  0.0625 mg Oral Daily  . gabapentin  300 mg Oral QHS  . heparin  5,000 Units Subcutaneous 3 times per day  . ipratropium  0.5 mg Nebulization Q4H  . levalbuterol  0.63 mg Nebulization 6 times per day  . losartan  25 mg Oral Daily  . methylPREDNISolone (SOLU-MEDROL) injection  40 mg Intravenous Q12H  . pantoprazole  40 mg Oral Daily  . sodium chloride  3 mL Intravenous Q12H    Time spent on care of this patient: 35 mins   MCCLUNG,JEFFREY T , MD   [facility] Office  [phone] Pager - Text Page per Loretha Stapler as per below:  On-Call/Text Page:      [email or web address]      password [email or web address]  If 7PM-7AM, please contact night-coverage [email or web address] Password [email or web address] 05/27/2014, 11:29 AM   LOS: 3 days

## 2014-05-27 NOTE — Progress Notes (Addendum)
Advanced Heart Failure Rounding Note   Subjective:    63 year old male with history of CHF (LVEF 20-25% - echo 1/15), chronic atrial fib (failed amio and DC-CV), COPD, [facility] c/o SOB with chest and upper back pain. CT chest showed bilateral effusion R>L and small R pneumothorax. Felt to have combination of CHF and COPD flare.   Diuresed well overnight again on lasix. Weight down 4 pounds  Lasix held last night due to low BP. Renal function stable.  CCM did chest u/s and not enough fluid to tap.  Breathing much better. Urine getting darker. AF rate ~105 on amio gtt.    Objective:   Weight Range:  Vital Signs:   Temp:  [97.3 F (36.3 C)-98.1 F (36.7 C)] 97.7 F (36.5 C) (10/31 0800) Pulse Rate:  [25-130] 25 (10/31 1114) Resp:  [0-26] 24 (10/31 1114) BP: (74-106)/(33-76) 91/50 mmHg (10/31 1114) SpO2:  [91 %-97 %] 95 % (10/31 1114) Weight:  [87.8 kg (193 lb 9 oz)] 87.8 kg (193 lb 9 oz) (10/31 0341) Last BM Date: 05/26/14  Weight change: Filed Weights   05/25/14 0400 05/26/14 0308 05/27/14 0341  Weight: 90.2 kg (198 lb 13.7 oz) 90 kg (198 lb 6.6 oz) 87.8 kg (193 lb 9 oz)    Intake/Output:   Intake/Output Summary (Last 24 hours) at 05/27/14 1218 Last data filed at 05/27/14 0800  Gross per 24 hour  Intake  442.4 ml  Output   1400 ml  Net -957.6 ml     Physical Exam: General:  Lying in bed. No resp difficulty HEENT: normal Neck: supple. JVP t8-9Carotids 2+ bilat; no bruits. No lymphadenopathy or thryomegaly appreciated. Cor: PMI laterally displaced. IRR, IRR tachy Lungs: poor air movement throughout. No wheezing. Very dull at bases Abdomen: obese soft, nontender, nondistended. No hepatosplenomegaly. No bruits or masses. Good bowel sounds. Extremities: no cyanosis, clubbing, rash, tr edema Neuro: alert & orientedx3, cranial nerves grossly intact. moves all 4 extremities w/o difficulty. Affect pleasant  Telemetry: AF 105  Labs: Basic Metabolic  Panel:  Recent Labs Lab 05/24/14 1001 05/24/14 2019 05/25/14 0230 05/26/14 0215 05/27/14 0322  NA 144  --  140 137 139  K 4.4  --  5.5* 4.7 4.8  CL 100  --  98 95* 93*  CO2 36*  --  32 32 38*  GLUCOSE 114*  --  129* 203* 153*  BUN 25*  --  32* 28* 32*  CREATININE 0.83 1.05 1.28 0.95 0.82  CALCIUM 9.5  --  9.7 9.1 9.5  MG  --   --  2.1 2.1 2.4  PHOS  --   --  5.7* 2.4 3.7    Liver Function Tests: No results found for this basename: AST, ALT, ALKPHOS, BILITOT, PROT, ALBUMIN,  in the last 168 hours No results found for this basename: LIPASE, AMYLASE,  in the last 168 hours No results found for this basename: AMMONIA,  in the last 168 hours  CBC:  Recent Labs Lab 05/24/14 1001 05/24/14 2019 05/25/14 0230 05/26/14 0215 05/27/14 0322  WBC 12.1* 12.9* 11.6* 15.0* 14.5*  NEUTROABS 9.7*  --   --   --   --   HGB 15.8 15.6 15.3 14.4 15.0  HCT 49.0 49.1 49.8 45.1 46.2  MCV 99.0 101.2* 102.3* 97.2 97.3  PLT 209 207 214 169 184    Cardiac Enzymes:  Recent Labs Lab 05/24/14 1001 05/24/14 1539  TROPONINI <0.30 <0.30    BNP: BNP (  last 3 results)  Recent Labs  08/20/13 1539 05/24/14 1001  PROBNP 2069.0* 2785.0*     Other results:  Imaging: Dg Chest Port 1 View  05/27/2014   CLINICAL DATA:  Short of breath, atrial fibrillation  EXAM: PORTABLE CHEST - 1 VIEW  COMPARISON:  Radiograph 05/26/2014  FINDINGS: Left-sided pacemaker with 2 continuous leads overlies stable enlarged cardiac silhouette. There is low lung volumes bilateral pleural effusions which not significant changed from prior. Mild central venous congestion.  IMPRESSION: Cardiomegaly, bilateral pleural effusions in central venous congestion without significant change.   Electronically Signed   By: Genevive BiStewart  Edmunds M.D.   On: 05/27/2014 07:55   Dg Chest Port 1 View  05/26/2014   CLINICAL DATA:  Pleural effusion.  EXAM: PORTABLE CHEST - 1 VIEW  COMPARISON:  05/25/2014  FINDINGS: Right pacer remains in  place, unchanged. There is cardiomegaly. Vascular congestion. Probable bilateral pleural effusions, right greater than left. Diffuse right lung airspace disease. Mild interstitial and airspace opacities on the left, slightly increased since prior study. Findings could represent asymmetric edema or infection.  IMPRESSION: Bilateral airspace disease, right greater than left. Airspace disease has slightly increased on the left since prior study. This could represent asymmetric edema or infection.  Small to moderate right pleural effusion. Suspect small left pleural effusion.   Electronically Signed   By: Charlett NoseKevin  Dover M.D.   On: 05/26/2014 07:46     Medications:     Scheduled Medications: . antiseptic oral rinse  7 mL Mouth Rinse BID  . [START ON 05/28/2014] digoxin  0.0625 mg Oral Daily  . gabapentin  300 mg Oral QHS  . heparin  5,000 Units Subcutaneous 3 times per day  . ipratropium  0.5 mg Nebulization Q4H  . levalbuterol  0.63 mg Nebulization 6 times per day  . losartan  25 mg Oral Daily  . methylPREDNISolone (SOLU-MEDROL) injection  40 mg Intravenous Q12H  . pantoprazole  40 mg Oral Daily  . sodium chloride  3 mL Intravenous Q12H    Infusions: . amiodarone 30 mg/hr (05/27/14 1100)    PRN Medications: sodium chloride, sodium chloride, acetaminophen, fentaNYL, metoprolol, oxyCODONE-acetaminophen, sodium chloride   Assessment:   1. Acute on chronic respiratory failure 2. Acute/chonic systolic HF - iCM EF 20-25% 3. Chronic AF now with RVR     --on Xarelto at home 4. R pleural effusion 5. Hyperkalemia 6. CAD s/p previous stenting 7. AECOPD - on home O2  Plan/Discussion:    I think main issue here is clearly acute on chronic HF on top of severe underlying COPD. Volume status much better. Will switch back to po torsemide.   AF rate remains slightly elevated. Will continue IV amio for rate control. He has failed amio for rhythm management in past so hopefully we can wean off prior  to discharge. Will try gentle bisoprolol. (was on Toprol prior to admit). With no thoracentesis can restart Xarelto (I have d/w Pharmacy and they have graciously agreed to make this change). Digoxin dose decreased.   Can go to tele from our standpoint.   Length of Stay: 3 Arvilla Meresaniel Bensimhon MD 05/27/2014, 12:18 PM  Advanced Heart Failure Team Pager [phone] (M-F; 7a - 4p)  Please contact [facility] for night-coverage after hours (4p -7a ) and weekends on [email or web address]

## 2014-05-28 ENCOUNTER — Inpatient Hospital Stay (HOSPITAL_COMMUNITY): Payer: Medicare Other

## 2014-05-28 DIAGNOSIS — J441 Chronic obstructive pulmonary disease with (acute) exacerbation: Secondary | ICD-10-CM

## 2014-05-28 LAB — COMPREHENSIVE METABOLIC PANEL
ALBUMIN: 3.1 g/dL — AB (ref 3.5–5.2)
ALT: 15 U/L (ref 0–53)
ANION GAP: 8 (ref 5–15)
AST: 13 U/L (ref 0–37)
Alkaline Phosphatase: 65 U/L (ref 39–117)
BILIRUBIN TOTAL: 0.4 mg/dL (ref 0.3–1.2)
BUN: 38 mg/dL — AB (ref 6–23)
CALCIUM: 9.6 mg/dL (ref 8.4–10.5)
CHLORIDE: 95 meq/L — AB (ref 96–112)
CO2: 39 mEq/L — ABNORMAL HIGH (ref 19–32)
CREATININE: 0.93 mg/dL (ref 0.50–1.35)
GFR calc Af Amer: >90 mL/min (ref >90)
GFR calc non Af Amer: 87 mL/min — ABNORMAL LOW (ref >90)
Glucose, Bld: 149 mg/dL — ABNORMAL HIGH (ref 70–99)
Potassium: 4.7 mEq/L (ref 3.7–5.3)
Sodium: 142 mEq/L (ref 137–147)
Total Protein: 6.2 g/dL (ref 6.0–8.3)

## 2014-05-28 NOTE — Progress Notes (Signed)
Pt's BP 58/38. [facility] on call [facility] notified and Amiodarone on hold.

## 2014-05-28 NOTE — Progress Notes (Signed)
Patient Name: [name] Date of Encounter: 05/28/2014     Active Problems:   Acute on chronic systolic CHF (congestive heart failure)   Decompensated COPD with exacerbation (chronic obstructive pulmonary disease)   Atrial fibrillation with rapid ventricular response    SUBJECTIVE  The patient states that he is breathing a little better today.  He has a prominent cough.weight is down 6 pounds since admission but blood pressure remains soft and very tenuous and prevents aggressive diuresis  CURRENT MEDS . antiseptic oral rinse  7 mL Mouth Rinse BID  . B-complex with vitamin C  1 tablet Oral Daily  . bisoprolol  2.5 mg Oral Daily  . budesonide-formoterol  2 puff Inhalation BID  . digoxin  0.0625 mg Oral Daily  . gabapentin  300 mg Oral QHS  . losartan  25 mg Oral Daily  . pantoprazole  40 mg Oral Daily  . rivaroxaban  20 mg Oral Q supper  . torsemide  20 mg Oral BID    OBJECTIVE  Filed Vitals:   05/28/14 0600 05/28/14 0800 05/28/14 1210 05/28/14 1300  BP: 88/69 97/55 115/79 94/59  Pulse: 68 30 35 87  Temp:  97.7 F (36.5 C) 97.5 F (36.4 C)   TempSrc:  Oral Oral   Resp:      Height:      Weight:      SpO2: 96% 95% 100% 79%    Intake/Output Summary (Last 24 hours) at 05/28/14 1321 Last data filed at 05/28/14 1100  Gross per 24 hour  Intake  613.6 ml  Output   1400 ml  Net -786.4 ml   Filed Weights   05/26/14 0308 05/27/14 0341 05/28/14 0352  Weight: 198 lb 6.6 oz (90 kg) 193 lb 9 oz (87.8 kg) 192 lb 14.4 oz (87.5 kg)    PHYSICAL EXAM  General: Pleasant, NAD.oxygen in place Neuro: Alert and oriented X 3. Moves all extremities spontaneously. Psych: Normal affect. HEENT:  Normal  Neck: Supple without bruits or JVD. Lungs:  Coarse rhonchi and wheezes bilaterally. Heart: irregular rhythm.no murmur gallop rub heard above lung noises Abdomen: Soft, non-tender, non-distended, BS + x 4.  Extremities: No clubbing, cyanosis or edema. DP/PT/Radials 2+ and  equal bilaterally.  Accessory Clinical Findings  CBC  Recent Labs  05/26/14 0215 05/27/14 0322  WBC 15.0* 14.5*  HGB 14.4 15.0  HCT 45.1 46.2  MCV 97.2 97.3  PLT 169 184   Basic Metabolic Panel  Recent Labs  05/26/14 0215 05/27/14 0322 05/28/14 0310  NA 137 139 142  K 4.7 4.8 4.7  CL 95* 93* 95*  CO2 32 38* 39*  GLUCOSE 203* 153* 149*  BUN 28* 32* 38*  CREATININE 0.95 0.82 0.93  CALCIUM 9.1 9.5 9.6  MG 2.1 2.4  --   PHOS 2.4 3.7  --    Liver Function Tests  Recent Labs  05/28/14 0310  AST 13  ALT 15  ALKPHOS 65  BILITOT 0.4  PROT 6.2  ALBUMIN 3.1*   No results for input(s): LIPASE, AMYLASE in the last 72 hours. Cardiac Enzymes No results for input(s): CKTOTAL, CKMB, CKMBINDEX, TROPONINI in the last 72 hours. BNP Invalid input(s): POCBNP D-Dimer No results for input(s): DDIMER in the last 72 hours. Hemoglobin A1C No results for input(s): HGBA1C in the last 72 hours. Fasting Lipid Panel No results for input(s): CHOL, HDL, LDLCALC, TRIG, CHOLHDL, LDLDIRECT in the last 72 hours. Thyroid Function Tests No results for input(s): TSH, T4TOTAL, T3FREE,  THYROIDAB in the last 72 hours.  Invalid input(s): FREET3  TELE  Atrial fibrillation with controlled ventricular response  ECG    Radiology/Studies  Dg Chest Port 1 View  05/28/2014   CLINICAL DATA:  CHF and right pleural effusion.  EXAM: PORTABLE CHEST - 1 VIEW  COMPARISON:  05/27/2014  FINDINGS: Right pleural effusion likely partially loculated and relatively small in volume. This fusion has likely decreased in size since 10/29 and 10/30. No evidence of pneumothorax. No significant residual edema with mild interstitial prominence remaining. Pacemaker shows stable appearance. Stable cardiomegaly.  IMPRESSION: Small right pleural effusion which is likely partially loculated.   Electronically Signed   By: Irish LackGlenn  Yamagata M.D.   On: 05/28/2014 10:07   Dg Chest Port 1 View  05/27/2014   CLINICAL DATA:   Short of breath, atrial fibrillation  EXAM: PORTABLE CHEST - 1 VIEW  COMPARISON:  Radiograph 05/26/2014  FINDINGS: Left-sided pacemaker with 2 continuous leads overlies stable enlarged cardiac silhouette. There is low lung volumes bilateral pleural effusions which not significant changed from prior. Mild central venous congestion.  IMPRESSION: Cardiomegaly, bilateral pleural effusions in central venous congestion without significant change.   Electronically Signed   By: Genevive BiStewart  Edmunds M.D.   On: 05/27/2014 07:55   Dg Chest Port 1 View  05/26/2014   CLINICAL DATA:  Pleural effusion.  EXAM: PORTABLE CHEST - 1 VIEW  COMPARISON:  05/25/2014  FINDINGS: Right pacer remains in place, unchanged. There is cardiomegaly. Vascular congestion. Probable bilateral pleural effusions, right greater than left. Diffuse right lung airspace disease. Mild interstitial and airspace opacities on the left, slightly increased since prior study. Findings could represent asymmetric edema or infection.  IMPRESSION: Bilateral airspace disease, right greater than left. Airspace disease has slightly increased on the left since prior study. This could represent asymmetric edema or infection.  Small to moderate right pleural effusion. Suspect small left pleural effusion.   Electronically Signed   By: Charlett NoseKevin  Dover M.D.   On: 05/26/2014 07:46   Dg Chest Port 1 View  05/25/2014   CLINICAL DATA:  63 year old male with shortness of breath. Pleural effusion. Ischemic cardiomyopathy. Atrial fibrillation. Subsequent encounter.  EXAM: PORTABLE CHEST - 1 VIEW  COMPARISON:  05/24/2014 CT and chest x-ray  FINDINGS: Large right pleural effusion remains. The CT detected small right-sided pneumothorax is not as well delineated on the present plain film examination. This may be secondary to the small size of this pneumothorax an medial location.  CT detected left-sided pleural effusion not as well appreciated on present chest x-ray.  Enhancing left lower  lobe nodule noted on CT not adequately assessed on the present plain film exam.  Basilar atelectatic changes.  Pulmonary vascular congestion.  AICD/biventricular pacer is in place with cardiomegaly.  Calcified aorta.  IMPRESSION: Large right pleural effusion remains. The CT detected small right-sided pneumothorax is not as well delineated on the present plain film examination. This may be secondary to the small size of this pneumothorax and medial location.  CT detected left-sided pleural effusion not as well appreciated on present chest x-ray.  Enhancing left lower lobe nodule noted on CT not adequately assessed on the present plain film exam.  Basilar atelectatic changes.  Pulmonary vascular congestion.  AICD/biventricular pacer is in place with cardiomegaly.   Electronically Signed   By: Bridgett LarssonSteve  Olson M.D.   On: 05/25/2014 07:56   Dg Chest Portable 1 View  05/24/2014   CLINICAL DATA:  Shortness of breath.  EXAM: PORTABLE CHEST -  1 VIEW  COMPARISON:  August 20, 2013.  FINDINGS: Stable cardiomegaly. Right-sided PICC line is unchanged in position. Stable interstitial densities are noted throughout both lungs consistent with scarring or possibly superimposed pulmonary edema. No pneumothorax is noted. Increased right basilar opacity is noted concerning for pneumonia or atelectasis with associated pleural effusion.  IMPRESSION: Stable interstitial densities are noted throughout both lungs which may simply represent chronic scarring, but superimposed edema cannot be excluded. Increased right basilar opacity is noted concerning for worsening pneumonia or atelectasis with associated pleural effusion.   Electronically Signed   By: Roque LiasJames  Green M.D.   On: 05/24/2014 10:02   Ct Angio Chest Aorta W/cm &/or Wo/cm  05/24/2014   CLINICAL DATA:  Acute back pain. Ischemic cardiomyopathy. Shortness of breath. Productive cough.  Cyst more atrial fibrillation, on anti coagulation. Worsening shortness of breath. Possible  sepsis.  EXAM: CT ANGIOGRAPHY CHEST, ABDOMEN AND PELVIS  TECHNIQUE: Multidetector CT imaging through the chest, abdomen and pelvis was performed using the standard protocol during bolus administration of intravenous contrast. Multiplanar reconstructed images and MIPs were obtained and reviewed to evaluate the vascular anatomy.  CONTRAST:  100 cc Omnipaque 350  COMPARISON:  None.  FINDINGS: CTA CHEST FINDINGS  Initial noncontrast CT images of the chest demonstrate no findings of acute intramural hematoma in the aorta.  Dissection protocol systemic arterial phase images through the chest demonstrate only mild atherosclerotic calcification in the aorta. coronary artery stents and calcification noted. Cardiomegaly involving all 4 chambers. 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 Thinning of the left ventricular apex with a small pseudoaneurysm of the LV apex shown on images 75 through 80 of series 5 in the vicinity of the infarct marked.  There is enough contrast in the pulmonary arterial vasculature to indicate a low likelihood of pulmonary embolus although the pulmonary embolus protocol was not specifically obtained.  A 1.5 by 1.2 cm pleural-based left upper lobe nodule is present posteriorly on image 23 of series 5. Comparing pre and post-contrast images this nodule is clearly enhancing.  Large right and moderate left pleural effusions. No separate enhancing pleural mass is visible nor is there significant pleural enhancement to specifically indicate an exudative effusion. However, there is a small right pneumothorax visible, with the gas component at under 5% of right hemithoracic volume.  Scattered lymph nodes in the chest include an 8 mm in short axis prevascular lymph node on image 24 of series 5 and a 1 cm right paratracheal lymph node on image 36 of series 5. There is considerable passive atelectasis in both lungs. Pacer device  observed.  Review of the MIP images confirms the above findings.  CTA ABDOMEN AND PELVIS FINDINGS  The celiac trunk, SMA, and IMA appear patent. Mild intimal thickening and faint atherosclerotic calcification distally in the abdominal aorta intra and in the iliac vessels. There 2 renal arteries present bilaterally, which appear patent. No aneurysm or dissection observed.  Abnormal hypodense lesion in the left hepatic lobe measuring 1.4 by 1.0 cm on image 86 of series 5, nonspecific. Similar lesion anteriorly in the left hepatic lobe, 1.0 by 0.8 cm on image 92 of series 5. These are similar to 03/08/2012 equivocal nodularity of the liver margin.  The spleen, pancreas, and adrenal glands appear unremarkable. No pathologic upper abdominal adenopathy is observed. No pathologic pelvic adenopathy is observed. Left mid kidney 4.4 by 4.1 cm cyst, image 117 of series 5. Right kidney lower  pole scar.  No dilated bowel.  Appendix normal.  No free pelvic fluid.  Mildly prominent prostate gland 5.1 by 4.8 cm with some lower central calcifications.  Review of the MIP images confirms the above findings.  IMPRESSION: 1. No dissection or pulmonary embolus identified. 2. Large right pleural effusion with small pneumothorax component (hydropneumothorax). Moderate left pleural effusion. Scattered atelectasis in both lungs, mostly passive. 3. Enhancing nodule in the left upper lobe measuring 1.5 by 1.2 cm. This has increased from the previous reported size of 0.5 cm on 04/14/2011. Appearance is now concerning for lung cancer. Biopsy recommended when clinically feasible. 4. 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 5. Several hypodense lesions in the liver are similar to the 03/08/2012 exam and accordingly probably benign. 6. Left mid kidney cyst and the right kidney lower pole scarring.   Electronically Signed   By: Herbie Baltimore M.D.   On: 05/24/2014 16:42   Ct  Angio Abd/pel W/ And/or W/o  05/24/2014   CLINICAL DATA:  Acute back pain. Ischemic cardiomyopathy. Shortness of breath. Productive cough.  Cyst more atrial fibrillation, on anti coagulation. Worsening shortness of breath. Possible sepsis.  EXAM: CT ANGIOGRAPHY CHEST, ABDOMEN AND PELVIS  TECHNIQUE: Multidetector CT imaging through the chest, abdomen and pelvis was performed using the standard protocol during bolus administration of intravenous contrast. Multiplanar reconstructed images and MIPs were obtained and reviewed to evaluate the vascular anatomy.  CONTRAST:  100 cc Omnipaque 350  COMPARISON:  None.  FINDINGS: CTA CHEST FINDINGS  Initial noncontrast CT images of the chest demonstrate no findings of acute intramural hematoma in the aorta.  Dissection protocol systemic arterial phase images through the chest demonstrate only mild atherosclerotic calcification in the aorta. coronary artery stents and calcification noted. Cardiomegaly involving all 4 chambers. 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 Thinning of the left ventricular apex with a small pseudoaneurysm of the LV apex shown on images 75 through 80 of series 5 in the vicinity of the infarct marked.  There is enough contrast in the pulmonary arterial vasculature to indicate a low likelihood of pulmonary embolus although the pulmonary embolus protocol was not specifically obtained.  A 1.5 by 1.2 cm pleural-based left upper lobe nodule is present posteriorly on image 23 of series 5. Comparing pre and post-contrast images this nodule is clearly enhancing.  Large right and moderate left pleural effusions. No separate enhancing pleural mass is visible nor is there significant pleural enhancement to specifically indicate an exudative effusion. However, there is a small right pneumothorax visible, with the gas component at under 5% of right hemithoracic volume.  Scattered lymph  nodes in the chest include an 8 mm in short axis prevascular lymph node on image 24 of series 5 and a 1 cm right paratracheal lymph node on image 36 of series 5. There is considerable passive atelectasis in both lungs. Pacer device observed.  Review of the MIP images confirms the above findings.  CTA ABDOMEN AND PELVIS FINDINGS  The celiac trunk, SMA, and IMA appear patent. Mild intimal thickening and faint atherosclerotic calcification distally in the abdominal aorta intra and in the iliac vessels. There 2 renal arteries present bilaterally, which appear patent. No aneurysm or dissection observed.  Abnormal hypodense lesion in the left hepatic lobe measuring 1.4 by 1.0 cm on image 86 of series 5, nonspecific. Similar lesion anteriorly in the left hepatic  lobe, 1.0 by 0.8 cm on image 92 of series 5. These are similar to 03/08/2012 equivocal nodularity of the liver margin.  The spleen, pancreas, and adrenal glands appear unremarkable. No pathologic upper abdominal adenopathy is observed. No pathologic pelvic adenopathy is observed. Left mid kidney 4.4 by 4.1 cm cyst, image 117 of series 5. Right kidney lower pole scar.  No dilated bowel.  Appendix normal.  No free pelvic fluid.  Mildly prominent prostate gland 5.1 by 4.8 cm with some lower central calcifications.  Review of the MIP images confirms the above findings.  IMPRESSION: 1. No dissection or pulmonary embolus identified. 2. Large right pleural effusion with small pneumothorax component (hydropneumothorax). Moderate left pleural effusion. Scattered atelectasis in both lungs, mostly passive. 3. Enhancing nodule in the left upper lobe measuring 1.5 by 1.2 cm. This has increased from the previous reported size of 0.5 cm on 04/14/2011. Appearance is now concerning for lung cancer. Biopsy recommended when clinically feasible. 4. 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 5. Several hypodense lesions  in the liver are similar to the 03/08/2012 exam and accordingly probably benign. 6. Left mid kidney cyst and the right kidney lower pole scarring.   Electronically Signed   By: Herbie Baltimore M.D.   On: 05/24/2014 16:42    ASSESSMENT AND PLAN 1. Acute on chronic respiratory failure. Continues to smoke at home 2. Acute/chonic systolic HF - iCM EF 20-25% 3. Chronic AF   --on Xarelto for anticoagulation 4. R pleural effusionslightly improved with diuresis 5. Hyperkalemia 6. CAD s/p previous stenting 7. AECOPD - on home O2  Plan:Continue attempts at diuresis.limited by low blood pressure. Smoking cessation is mandatory. Signed, Cassell Clement MD

## 2014-05-28 NOTE — Progress Notes (Signed)
[facility] TEAM 1 - Stepdown/ICU TEAM Progress Note  Lavella LemonsReuben S Heiser YQM:[id number] DOB: 1951/04/07 DOA: 05/24/2014 PCP: [name],ASHISH, MD  Admit HPI / Brief Narrative: 63 year old male with PMH which includes CAD s/p several stents, CHF (LCEF 20-25% 07/2013), COPD (On home O2, followed by Dr. [name], PFTs suggestive of restrictive disease), and Afib refractory to drugs and cardioversion, on rivaroxaban who had been feeling ill for several months prior to admission. About one month prior he began having chest pain, sob, and peripheral edema. He had been severely limited at home, unable to perform ADLs without becoming profoundly dyspneic. The morning of 10/28 he called his sister to take him to ED, however he was so dyspneic she had to call EMS. In ED he was SOB and hypotensive with chest and upper back pain. He was sent for CTA to r/o aortic dissection. CT showed large bilateral effusions R>L with small R PTX. PCCM has been called for admission.  HPI/Subjective: Has experienced hypotension over the last 12hrs, preventing his transfer to tele bed.  Amio has been stopped.  He denies new complaints today, but reports that he feels weak in general and notes chronic back pain.  Denies SSCP.    Assessment/Plan:  Bilateral pleural effusions R>L w/ small R PTX likely 2nd to decompensated CHF - thoracentesis not performed due to small volume - cont w/ attempts to diurese - f/u CXR suggests small area of loculation on the R   Acute on chronic systolic and diastolic CHF - ischemic CM LVEF 20-25% - echo 1/15 - diuresis being limited by hypotension - Cardiology following   COPD with acute exacerbation - ongoing tobacco use  No wheezing on exam - d/c steroids - follow w/ nebs - MUST quite smoking completely  S/P AICD  Followed by Dr. [name]   LUL Lung nodule on CT chest  Awaiting comment from Pulmonary - 1.5 x 1.2cm pleural based - increased in size since Sept 2012 w/ appearance concerning for lung CA  - feel bx will likely be indicated, if only for prognostic value, though pt not stable enough for same now    Parox Atrial fibrillation with acute RVR on rivaroxaban as outpt - failed cardioversion several times - rate controlled off amiodarone at present - dilt made hypotensive - dig loaded per Cards - now back on Xarelto    Hypotension  Care w/ diuresis   H/o CAD troponin negative x 2 - denies cp   Code Status: FULL Family Communication: no family present at time of exam Disposition Plan: SDU due to hypotension   Consultants: Cardiology  PCCM  Procedures: none  Antibiotics: azithro 10/28 Rocephin 10/28  DVT prophylaxis: IV heparin    Objective: Blood pressure 94/59, pulse 87, temperature 97.5 F (36.4 C), temperature source Oral, resp. rate 10, height 5\' 7"  (1.702 m), weight 87.5 kg (192 lb 14.4 oz), SpO2 79 %.  Intake/Output Summary (Last 24 hours) at 05/28/14 1438 Last data filed at 05/28/14 1100  Gross per 24 hour  Intake  613.6 ml  Output   1400 ml  Net -786.4 ml   Exam: General: no signif resp distress laying in bed - alert and conversant  Lungs: poor air movement th/o - very diminished bs in B bases - no wheeze  Cardiovascular: irreg irreg - distant hs - rate controlled presently  Abdomen: Nontender, nondistended, soft, bowel sounds positive, no rebound, no ascites, no appreciable mass Extremities: No significant cyanosis, clubbing;  1+ edema bilateral lower extremities  Data Reviewed: Basic Metabolic Panel:  Recent Labs Lab 05/24/14 1001 05/24/14 2019 05/25/14 0230 05/26/14 0215 05/27/14 0322 05/28/14 0310  NA 144  --  140 137 139 142  K 4.4  --  5.5* 4.7 4.8 4.7  CL 100  --  98 95* 93* 95*  CO2 36*  --  32 32 38* 39*  GLUCOSE 114*  --  129* 203* 153* 149*  BUN 25*  --  32* 28* 32* 38*  CREATININE 0.83 1.05 1.28 0.95 0.82 0.93  CALCIUM 9.5  --  9.7 9.1 9.5 9.6  MG  --   --  2.1 2.1 2.4  --   PHOS  --   --  5.7* 2.4 3.7  --     Liver  Function Tests:  Recent Labs Lab 05/28/14 0310  AST 13  ALT 15  ALKPHOS 65  BILITOT 0.4  PROT 6.2  ALBUMIN 3.1*   Coags:  Recent Labs Lab 05/26/14 0215  INR 1.45   CBC:  Recent Labs Lab 05/24/14 1001 05/24/14 2019 05/25/14 0230 05/26/14 0215 05/27/14 0322  WBC 12.1* 12.9* 11.6* 15.0* 14.5*  NEUTROABS 9.7*  --   --   --   --   HGB 15.8 15.6 15.3 14.4 15.0  HCT 49.0 49.1 49.8 45.1 46.2  MCV 99.0 101.2* 102.3* 97.2 97.3  PLT 209 207 214 169 184    Cardiac Enzymes:  Recent Labs Lab 05/24/14 1001 05/24/14 1539  TROPONINI <0.30 <0.30   BNP (last 3 results)  Recent Labs  08/20/13 1539 05/24/14 1001  PROBNP 2069.0* 2785.0*    Recent Results (from the past 240 hour(s))  Blood culture (routine x 2)     Status: None (Preliminary result)   Collection Time: 05/24/14  4:21 PM  Result Value Ref Range Status   Specimen Description BLOOD RIGHT HAND  Final   Special Requests   Final    BOTTLES DRAWN AEROBIC AND ANAEROBIC 10CC PT ON ROCEPHIN ZITHROMAX   Culture  Setup Time   Final    05/24/2014 22:41 Performed at [facility]   Culture   Final           BLOOD CULTURE RECEIVED NO GROWTH TO DATE CULTURE WILL BE HELD FOR 5 DAYS BEFORE ISSUING A FINAL NEGATIVE REPORT Performed at [facility]   Report Status PENDING  Incomplete  Blood culture (routine x 2)     Status: None (Preliminary result)   Collection Time: 05/24/14  4:34 PM  Result Value Ref Range Status   Specimen Description BLOOD LEFT HAND  Final   Special Requests   Final    BOTTLES DRAWN AEROBIC AND ANAEROBIC 10CC PT ON ROCEPHIN ZITHROMAX   Culture  Setup Time   Final    05/24/2014 22:40 Performed at [facility]   Culture   Final           BLOOD CULTURE RECEIVED NO GROWTH TO DATE CULTURE WILL BE HELD FOR 5 DAYS BEFORE ISSUING A FINAL NEGATIVE REPORT Performed at [facility]   Report Status PENDING  Incomplete  MRSA PCR Screening     Status: None   Collection Time:  05/24/14  7:16 PM  Result Value Ref Range Status   MRSA by PCR NEGATIVE NEGATIVE Final    Comment:        The GeneXpert MRSA Assay (FDA approved for NASAL specimens only), is one component of a comprehensive MRSA colonization surveillance program. It is not intended to diagnose  MRSA infection nor to guide or monitor treatment for MRSA infections.     Studies:  Recent x-ray studies have been reviewed in detail by the Attending Physician  Scheduled Meds:  Scheduled Meds: . antiseptic oral rinse  7 mL Mouth Rinse BID  . B-complex with vitamin C  1 tablet Oral Daily  . bisoprolol  2.5 mg Oral Daily  . budesonide-formoterol  2 puff Inhalation BID  . digoxin  0.0625 mg Oral Daily  . gabapentin  300 mg Oral QHS  . losartan  25 mg Oral Daily  . pantoprazole  40 mg Oral Daily  . rivaroxaban  20 mg Oral Q supper  . torsemide  20 mg Oral BID    Time spent on care of this patient: 35 mins   Takyra Cantrall T , MD   [facility] Office  [phone] Pager - Text Page per Loretha StaplerAmion as per below:  On-Call/Text Page:      [email or web address]      password [email or web address]  If 7PM-7AM, please contact night-coverage [email or web address] Password [email or web address] 05/28/2014, 2:38 PM   LOS: 4 days

## 2014-05-29 MED ORDER — TORSEMIDE 20 MG PO TABS
40.0000 mg | ORAL_TABLET | Freq: Every day | ORAL | Status: DC
Start: 2014-05-30 — End: 2014-05-30
  Administered 2014-05-30: 40 mg via ORAL
  Filled 2014-05-29 (×2): qty 2

## 2014-05-29 MED ORDER — TORSEMIDE 20 MG PO TABS
20.0000 mg | ORAL_TABLET | Freq: Once | ORAL | Status: AC
  Administered 2014-05-29: 20 mg via ORAL

## 2014-05-29 MED ORDER — TORSEMIDE 20 MG PO TABS
20.0000 mg | ORAL_TABLET | Freq: Every evening | ORAL | Status: DC
  Administered 2014-05-29: 20 mg via ORAL
  Filled 2014-05-29 (×2): qty 1

## 2014-05-29 NOTE — Progress Notes (Signed)
Medicare Important Message given? YES  (If response is "NO", the following Medicare IM given date fields will be blank)  Date Medicare IM given:  05/29/2014 Medicare IM given by: Hong Moring 

## 2014-05-29 NOTE — Discharge Instructions (Signed)

## 2014-05-29 NOTE — Progress Notes (Signed)
[place] TEAM 1 - Stepdown/ICU TEAM Progress Note  [name] LemonsReuben S [name] ZHY:[id number] DOB: 18-Feb-1951 DOA: 05/24/2014 PCP: [name],ASHISH, MD  Admit HPI / Brief Narrative: 63 year old male with PMH which includes CAD s/p several stents, CHF (LCEF 20-25% 07/2013), COPD (On home O2, followed by Dr. [name], PFTs suggestive of restrictive disease), and Afib refractory to drugs and cardioversion, on rivaroxaban who had been feeling ill for several months prior to admission. About one month prior he began having chest pain, sob, and peripheral edema. He had been severely limited at home, unable to perform ADLs without becoming profoundly dyspneic. The morning of 10/28 he called his sister to take him to ED, however he was so dyspneic she had to call EMS. In ED he was SOB and hypotensive with chest and upper back pain. He was sent for CTA to r/o aortic dissection. CT showed large bilateral effusions R>L with small R PTX. PCCM has been called for admission.  HPI/Subjective: States he feels he is getting better.  Denies new complaints.  No SSCP, abodm pain, n/v.      Assessment/Plan:  Bilateral pleural effusions R>L w/ small R PTX likely 2nd to decompensated CHF - thoracentesis not performed due to small volume - cont w/ attempts to diurese - f/u CXR suggests small area of loculation on the R   Acute on chronic systolic and diastolic CHF - ischemic CM LVEF 20-25% - echo 1/15 - diuresis being limited by hypotension - Cardiology following and titrating meds   COPD with acute exacerbation - ongoing tobacco use  No wheezing on exam - d/c steroids - follow w/ nebs - MUST quite smoking completely  S/P AICD  Followed by Dr. [name]   LUL Lung nodule on CT chest  Awaiting comment from Pulmonary - 1.5 x 1.2cm pleural based - increased in size since Sept 2012 w/ appearance concerning for lung CA - feel bx will likely be indicated, if only for prognostic value, though pt not stable enough for same now - discussed  findings w/ pt at length   Parox Atrial fibrillation with acute RVR on rivaroxaban as outpt - failed cardioversion several times - rate controlled off amiodarone at present - dilt made hypotensive - dig loaded per Cards - now back on Xarelto    Hypotension  Care w/ diuresis   H/o CAD troponin negative x 2 - denies cp   Code Status: FULL Family Communication: no family present at time of exam Disposition Plan: SDU due to hypotension   Consultants: Cardiology  PCCM  Procedures: none  Antibiotics: azithro 10/28 Rocephin 10/28  DVT prophylaxis: Xarelto  Objective: Blood pressure 85/56, pulse 60, temperature 97.8 F (36.6 C), temperature source Oral, resp. rate 20, height 5\' 7"  (1.702 m), weight 86.2 kg (190 lb 0.6 oz), SpO2 100 %.  Intake/Output Summary (Last 24 hours) at 05/29/14 1154 Last data filed at 05/29/14 1100  Gross per 24 hour  Intake   1370 ml  Output   2775 ml  Net  -1405 ml   Exam: General: no signif resp distress sitting up in chair - alert and conversant  Lungs: poor air movement th/o - very diminished bs in B bases - no wheeze  Cardiovascular: irreg irreg - distant hs - rate controlled  Abdomen: Nontender, nondistended, soft, bowel sounds positive, no rebound, no ascites, no appreciable mass Extremities: No significant cyanosis, clubbing;  1+ edema bilateral lower extremities  Data Reviewed: Basic Metabolic Panel:  Recent Labs Lab 05/24/14 1001 05/24/14  2019 05/25/14 0230 05/26/14 0215 05/27/14 0322 05/28/14 0310  NA 144  --  140 137 139 142  K 4.4  --  5.5* 4.7 4.8 4.7  CL 100  --  98 95* 93* 95*  CO2 36*  --  32 32 38* 39*  GLUCOSE 114*  --  129* 203* 153* 149*  BUN 25*  --  32* 28* 32* 38*  CREATININE 0.83 1.05 1.28 0.95 0.82 0.93  CALCIUM 9.5  --  9.7 9.1 9.5 9.6  MG  --   --  2.1 2.1 2.4  --   PHOS  --   --  5.7* 2.4 3.7  --     Liver Function Tests:  Recent Labs Lab 05/28/14 0310  AST 13  ALT 15  ALKPHOS 65  BILITOT 0.4    PROT 6.2  ALBUMIN 3.1*   Coags:  Recent Labs Lab 05/26/14 0215  INR 1.45   CBC:  Recent Labs Lab 05/24/14 1001 05/24/14 2019 05/25/14 0230 05/26/14 0215 05/27/14 0322  WBC 12.1* 12.9* 11.6* 15.0* 14.5*  NEUTROABS 9.7*  --   --   --   --   HGB 15.8 15.6 15.3 14.4 15.0  HCT 49.0 49.1 49.8 45.1 46.2  MCV 99.0 101.2* 102.3* 97.2 97.3  PLT 209 207 214 169 184    Cardiac Enzymes:  Recent Labs Lab 05/24/14 1001 05/24/14 1539  TROPONINI <0.30 <0.30   BNP (last 3 results)  Recent Labs  08/20/13 1539 05/24/14 1001  PROBNP 2069.0* 2785.0*    Recent Results (from the past 240 hour(s))  Blood culture (routine x 2)     Status: None (Preliminary result)   Collection Time: 05/24/14  4:21 PM  Result Value Ref Range Status   Specimen Description BLOOD RIGHT HAND  Final   Special Requests   Final    BOTTLES DRAWN AEROBIC AND ANAEROBIC 10CC PT ON ROCEPHIN ZITHROMAX   Culture  Setup Time   Final    05/24/2014 22:41 Performed at [facility]   Culture   Final           BLOOD CULTURE RECEIVED NO GROWTH TO DATE CULTURE WILL BE HELD FOR 5 DAYS BEFORE ISSUING A FINAL NEGATIVE REPORT Performed at [facility]   Report Status PENDING  Incomplete  Blood culture (routine x 2)     Status: None (Preliminary result)   Collection Time: 05/24/14  4:34 PM  Result Value Ref Range Status   Specimen Description BLOOD LEFT HAND  Final   Special Requests   Final    BOTTLES DRAWN AEROBIC AND ANAEROBIC 10CC PT ON ROCEPHIN ZITHROMAX   Culture  Setup Time   Final    05/24/2014 22:40 Performed at [facility]   Culture   Final           BLOOD CULTURE RECEIVED NO GROWTH TO DATE CULTURE WILL BE HELD FOR 5 DAYS BEFORE ISSUING A FINAL NEGATIVE REPORT Performed at [facility]   Report Status PENDING  Incomplete  MRSA PCR Screening     Status: None   Collection Time: 05/24/14  7:16 PM  Result Value Ref Range Status   MRSA by PCR NEGATIVE NEGATIVE Final     Comment:        The GeneXpert MRSA Assay (FDA approved for NASAL specimens only), is one component of a comprehensive MRSA colonization surveillance program. It is not intended to diagnose MRSA infection nor to guide or monitor treatment for MRSA infections.  Studies:  Recent x-ray studies have been reviewed in detail by the Attending Physician  Scheduled Meds:  Scheduled Meds: . antiseptic oral rinse  7 mL Mouth Rinse BID  . B-complex with vitamin C  1 tablet Oral Daily  . budesonide-formoterol  2 puff Inhalation BID  . digoxin  0.0625 mg Oral Daily  . gabapentin  300 mg Oral QHS  . pantoprazole  40 mg Oral Daily  . rivaroxaban  20 mg Oral Q supper  . torsemide  20 mg Oral QPM  . [START ON 05/30/2014] torsemide  40 mg Oral Q breakfast    Time spent on care of this patient: 25 mins   MCCLUNG,JEFFREY T , MD   [facility] Office  [phone] Pager - Text Page per Loretha StaplerAmion as per below:  On-Call/Text Page:      [email or web address]      password [email or web address]  If 7PM-7AM, please contact night-coverage [email or web address] Password [email or web address] 05/29/2014, 11:54 AM   LOS: 5 days

## 2014-05-29 NOTE — Plan of Care (Signed)
Problem: Phase II Progression Outcomes Goal: Tolerating diet Outcome: Completed/Met Date Met:  05/29/14     

## 2014-05-29 NOTE — Progress Notes (Signed)
At 04:22 Pt. Had a 11 beat run of v-tach nonsustained, asystomatic. Dr. Adolm JosephWhitlock notified and no new orders obtained. Will continue to monitor.

## 2014-05-29 NOTE — Progress Notes (Signed)
Patient ID: [name] LemonsReuben S [name], male   DOB: 08-Sep-1950, 63 y.o.   MRN: [id number] Advanced Heart Failure Rounding Note   Subjective:    63 year old male with history of CAD/ischemic cardiomyopathy, CHF (LVEF 20-25% - echo 1/15), chronic atrial fib (failed amio and DC-CV), COPD, [facility] c/o SOB with chest and upper back pain. CT chest showed bilateral effusion R>L and small R pneumothorax. Felt to have combination of CHF and COPD flare.  CCM decided against thoracentesis (not enough fluid on ultrasound).   Reasonable diuresis.  Weight down 2 lbs.  Creatinine stable.  Still short of breath.  HR in 100s atrial fibrillation with PVCs, off amiodarone. BP soft at times overnight, now SBP 100s.   Objective:   Weight Range:  Vital Signs:   Temp:  [97.4 F (36.3 C)-98.4 F (36.9 C)] 97.4 F (36.3 C) (11/02 0803) Pulse Rate:  [26-107] 51 (11/02 0900) Resp:  [18-20] 20 (11/02 0803) BP: (76-121)/(44-79) 104/65 mmHg (11/02 0900) SpO2:  [75 %-100 %] 100 % (11/02 0900) Weight:  [190 lb 0.6 oz (86.2 kg)] 190 lb 0.6 oz (86.2 kg) (11/02 0500) Last BM Date: 05/28/14  Weight change: Filed Weights   05/27/14 0341 05/28/14 0352 05/29/14 0500  Weight: 193 lb 9 oz (87.8 kg) 192 lb 14.4 oz (87.5 kg) 190 lb 0.6 oz (86.2 kg)    Intake/Output:   Intake/Output Summary (Last 24 hours) at 05/29/14 1103 Last data filed at 05/29/14 0600  Gross per 24 hour  Intake    970 ml  Output   2275 ml  Net  -1305 ml     Physical Exam: General:  Lying in bed. No resp difficulty HEENT: normal Neck: supple. JVP 8-9 Carotids 2+ bilat; no bruits. No lymphadenopathy or thryomegaly appreciated. Cor: PMI laterally displaced. IRR, IRR tachy Lungs: poor air movement throughout. Decreased BS at bases.  Some wheezing noted.  Abdomen: obese soft, nontender, mild distention. No hepatosplenomegaly. No bruits or masses. Good bowel sounds. Extremities: no cyanosis, clubbing, rash, 1+ ankle edema Neuro: alert &  orientedx3, cranial nerves grossly intact. moves all 4 extremities w/o difficulty. Affect pleasant  Telemetry: AF 100s with PVCs  Labs: Basic Metabolic Panel:  Recent Labs Lab 05/24/14 1001 05/24/14 2019 05/25/14 0230 05/26/14 0215 05/27/14 0322 05/28/14 0310  NA 144  --  140 137 139 142  K 4.4  --  5.5* 4.7 4.8 4.7  CL 100  --  98 95* 93* 95*  CO2 36*  --  32 32 38* 39*  GLUCOSE 114*  --  129* 203* 153* 149*  BUN 25*  --  32* 28* 32* 38*  CREATININE 0.83 1.05 1.28 0.95 0.82 0.93  CALCIUM 9.5  --  9.7 9.1 9.5 9.6  MG  --   --  2.1 2.1 2.4  --   PHOS  --   --  5.7* 2.4 3.7  --     Liver Function Tests:  Recent Labs Lab 05/28/14 0310  AST 13  ALT 15  ALKPHOS 65  BILITOT 0.4  PROT 6.2  ALBUMIN 3.1*   No results for input(s): LIPASE, AMYLASE in the last 168 hours. No results for input(s): AMMONIA in the last 168 hours.  CBC:  Recent Labs Lab 05/24/14 1001 05/24/14 2019 05/25/14 0230 05/26/14 0215 05/27/14 0322  WBC 12.1* 12.9* 11.6* 15.0* 14.5*  NEUTROABS 9.7*  --   --   --   --   HGB 15.8 15.6 15.3 14.4 15.0  HCT 49.0 49.1 49.8 45.1 46.2  MCV 99.0 101.2* 102.3* 97.2 97.3  PLT 209 207 214 169 184    Cardiac Enzymes:  Recent Labs Lab 05/24/14 1001 05/24/14 1539  TROPONINI <0.30 <0.30    BNP: BNP (last 3 results)  Recent Labs  08/20/13 1539 05/24/14 1001  PROBNP 2069.0* 2785.0*     Other results:  Imaging: Dg Chest Port 1 View  05/28/2014   CLINICAL DATA:  CHF and right pleural effusion.  EXAM: PORTABLE CHEST - 1 VIEW  COMPARISON:  05/27/2014  FINDINGS: Right pleural effusion likely partially loculated and relatively small in volume. This fusion has likely decreased in size since 10/29 and 10/30. No evidence of pneumothorax. No significant residual edema with mild interstitial prominence remaining. Pacemaker shows stable appearance. Stable cardiomegaly.  IMPRESSION: Small right pleural effusion which is likely partially loculated.    Electronically Signed   By: [name] LackGlenn  [name] M.D.   On: 05/28/2014 10:07     Medications:     Scheduled Medications: . antiseptic oral rinse  7 mL Mouth Rinse BID  . B-complex with vitamin C  1 tablet Oral Daily  . budesonide-formoterol  2 puff Inhalation BID  . digoxin  0.0625 mg Oral Daily  . gabapentin  300 mg Oral QHS  . pantoprazole  40 mg Oral Daily  . rivaroxaban  20 mg Oral Q supper  . torsemide  20 mg Oral QPM  . torsemide  20 mg Oral Once  . [START ON 05/30/2014] torsemide  40 mg Oral Q breakfast    Infusions:    PRN Medications: acetaminophen, fentaNYL, levalbuterol, oxyCODONE-acetaminophen   Assessment:   1. Acute on chronic respiratory failure 2. Acute/chonic systolic HF - ischemic CM EF 20-25%. St Jude ICD.  3. Chronic AF now with RVR     --on Xarelto at home 4. R pleural effusion 5. Hyperkalemia 6. CAD s/p previous stenting 7. AECOPD - on home O2  Plan/Discussion:    I think main issue here is acute on chronic HF on top of severe underlying COPD. Volume status improved, still mild volume overload but BUN rising.  Will give torsemide 40 qam/20 qpm today and check BMET in am.   AF rate remains slightly elevated. He has failed rhythm control.  For rate control, he is only on digoxin right now.  BP low overnight, better today.  If BP remains stable, will add bisoprolol 2.5 mg daily.  He has frequent PVCs but would like to avoid long-term amiodarone.   Length of Stay: 5 [name] Anconaalton Shain Pauwels MD 05/29/2014, 11:03 AM  Advanced Heart Failure Team Pager [phone] (M-F; 7a - 4p)  Please contact [facility] for night-coverage after hours (4p -7a ) and weekends on [email or web address]

## 2014-05-29 NOTE — Plan of Care (Signed)
Problem: Phase II Progression Outcomes Goal: Pain controlled Outcome: Progressing                  

## 2014-05-29 NOTE — Plan of Care (Signed)
Problem: Phase I Progression Outcomes Goal: Hemodynamically stable Outcome: Completed/Met Date Met:  05/29/14     

## 2014-05-30 DIAGNOSIS — J441 Chronic obstructive pulmonary disease with (acute) exacerbation: Secondary | ICD-10-CM | POA: Insufficient documentation

## 2014-05-30 DIAGNOSIS — I5043 Acute on chronic combined systolic (congestive) and diastolic (congestive) heart failure: Principal | ICD-10-CM

## 2014-05-30 DIAGNOSIS — J90 Pleural effusion, not elsewhere classified: Secondary | ICD-10-CM | POA: Insufficient documentation

## 2014-05-30 DIAGNOSIS — I959 Hypotension, unspecified: Secondary | ICD-10-CM | POA: Insufficient documentation

## 2014-05-30 DIAGNOSIS — Z95810 Presence of automatic (implantable) cardiac defibrillator: Secondary | ICD-10-CM | POA: Insufficient documentation

## 2014-05-30 DIAGNOSIS — I480 Paroxysmal atrial fibrillation: Secondary | ICD-10-CM

## 2014-05-30 LAB — CBC WITH DIFFERENTIAL/PLATELET
BASOS ABS: 0 10*3/uL (ref 0.0–0.1)
BASOS PCT: 0 % (ref 0–1)
Eosinophils Absolute: 0.4 10*3/uL (ref 0.0–0.7)
Eosinophils Relative: 4 % (ref 0–5)
HCT: 51.1 % (ref 39.0–52.0)
HEMOGLOBIN: 16.3 g/dL (ref 13.0–17.0)
Lymphocytes Relative: 10 % — ABNORMAL LOW (ref 12–46)
Lymphs Abs: 1 10*3/uL (ref 0.7–4.0)
MCH: 31.3 pg (ref 26.0–34.0)
MCHC: 31.9 g/dL (ref 30.0–36.0)
MCV: 98.3 fL (ref 78.0–100.0)
Monocytes Absolute: 0.9 10*3/uL (ref 0.1–1.0)
Monocytes Relative: 9 % (ref 3–12)
NEUTROS PCT: 77 % (ref 43–77)
Neutro Abs: 7.6 10*3/uL (ref 1.7–7.7)
Platelets: 206 10*3/uL (ref 150–400)
RBC: 5.2 MIL/uL (ref 4.22–5.81)
RDW: 13.3 % (ref 11.5–15.5)
WBC: 9.8 10*3/uL (ref 4.0–10.5)

## 2014-05-30 LAB — CULTURE, BLOOD (ROUTINE X 2)
CULTURE: NO GROWTH
Culture: NO GROWTH

## 2014-05-30 LAB — BASIC METABOLIC PANEL
ANION GAP: 8 (ref 5–15)
BUN: 34 mg/dL — ABNORMAL HIGH (ref 6–23)
CALCIUM: 9.5 mg/dL (ref 8.4–10.5)
CO2: 44 meq/L — AB (ref 19–32)
Chloride: 89 mEq/L — ABNORMAL LOW (ref 96–112)
Creatinine, Ser: 0.93 mg/dL (ref 0.50–1.35)
GFR calc Af Amer: >90 mL/min (ref >90)
GFR calc non Af Amer: 87 mL/min — ABNORMAL LOW (ref >90)
Glucose, Bld: 96 mg/dL (ref 70–99)
POTASSIUM: 4.2 meq/L (ref 3.7–5.3)
SODIUM: 141 meq/L (ref 137–147)

## 2014-05-30 LAB — PRO B NATRIURETIC PEPTIDE: Pro B Natriuretic peptide (BNP): 4076 pg/mL — ABNORMAL HIGH (ref 0–125)

## 2014-05-30 MED ORDER — LEVALBUTEROL HCL 0.63 MG/3ML IN NEBU
0.6300 mg | INHALATION_SOLUTION | Freq: Three times a day (TID) | RESPIRATORY_TRACT | Status: DC
  Administered 2014-05-30 – 2014-05-31 (×3): 0.63 mg via RESPIRATORY_TRACT
  Filled 2014-05-30 (×6): qty 3

## 2014-05-30 MED ORDER — METHYLPREDNISOLONE SODIUM SUCC 125 MG IJ SOLR
60.0000 mg | Freq: Two times a day (BID) | INTRAMUSCULAR | Status: DC
  Administered 2014-05-30 – 2014-05-31 (×3): 60 mg via INTRAVENOUS
  Filled 2014-05-30: qty 0.96
  Filled 2014-05-30 (×2): qty 2
  Filled 2014-05-30: qty 0.96

## 2014-05-30 MED ORDER — METOPROLOL SUCCINATE 12.5 MG HALF TABLET
12.5000 mg | ORAL_TABLET | Freq: Every day | ORAL | Status: DC
  Administered 2014-05-30: 12.5 mg via ORAL
  Filled 2014-05-30 (×2): qty 1

## 2014-05-30 MED ORDER — TORSEMIDE 20 MG PO TABS
20.0000 mg | ORAL_TABLET | Freq: Two times a day (BID) | ORAL | Status: DC
  Filled 2014-05-30 (×3): qty 1

## 2014-05-30 MED ORDER — LOSARTAN POTASSIUM 25 MG PO TABS
25.0000 mg | ORAL_TABLET | Freq: Every day | ORAL | Status: DC
  Administered 2014-05-30: 25 mg via ORAL
  Filled 2014-05-30 (×2): qty 1

## 2014-05-30 NOTE — Progress Notes (Signed)
Utilization review complete. Adelin Ventrella RN CCM Case Mgmt phone [phone] 

## 2014-05-30 NOTE — Progress Notes (Signed)
Patient ID: [name] LemonsReuben S [name], male   DOB: 1950/08/29, 63 y.o.   MRN: [id number] Advanced Heart Failure Rounding Note   Subjective:    63 year old male with history of CAD/ischemic cardiomyopathy, CHF (LVEF 20-25% - echo 1/15), chronic atrial fib (failed amio and DC-CV), COPD, [facility] c/o SOB with chest and upper back pain. CT chest showed bilateral effusion R>L and small R pneumothorax. Felt to have combination of CHF and COPD flare.  CCM decided against thoracentesis (not enough fluid on ultrasound).   No weight on chart today. I/O -1L. Breathing ok. Amio, losartan and bisoprolol held due to severe hypotension. Renal function stable. Bicarb up to 44.   Objective:   Weight Range:  Vital Signs:   Temp:  [97.8 F (36.6 C)-99.3 F (37.4 C)] 98.2 F (36.8 C) (11/03 0738) Pulse Rate:  [38-117] 117 (11/03 0738) Resp:  [20] 20 (11/02 2000) BP: (85-138)/(56-99) 125/94 mmHg (11/03 0738) SpO2:  [93 %-100 %] 96 % (11/03 0738) FiO2 (%):  [4 %] 4 % (11/02 1600) Last BM Date: 05/28/14  Weight change: Filed Weights   05/27/14 0341 05/28/14 0352 05/29/14 0500  Weight: 87.8 kg (193 lb 9 oz) 87.5 kg (192 lb 14.4 oz) 86.2 kg (190 lb 0.6 oz)    Intake/Output:   Intake/Output Summary (Last 24 hours) at 05/30/14 0920 Last data filed at 05/30/14 0700  Gross per 24 hour  Intake   1000 ml  Output   1950 ml  Net   -950 ml     Physical Exam: General:  Lying in bed. No resp difficulty HEENT: normal Neck: supple. JVP 6 Carotids 2+ bilat; no bruits. No lymphadenopathy or thryomegaly appreciated. Cor: PMI laterally displaced. IRR, IRR  Lungs: poor air movement throughout. Decreased BS at bases.  No wheezing Abdomen: obese soft, nontender, no distention. No hepatosplenomegaly. No bruits or masses. Good bowel sounds. Extremities: no cyanosis, clubbing, rash,  edema Neuro: alert & orientedx3, cranial nerves grossly intact. moves all 4 extremities w/o difficulty. Affect  pleasant  Telemetry: AF 80-90s with PVCs  Labs: Basic Metabolic Panel:  Recent Labs Lab 05/25/14 0230 05/26/14 0215 05/27/14 0322 05/28/14 0310 05/30/14 0407  NA 140 137 139 142 141  K 5.5* 4.7 4.8 4.7 4.2  CL 98 95* 93* 95* 89*  CO2 32 32 38* 39* 44*  GLUCOSE 129* 203* 153* 149* 96  BUN 32* 28* 32* 38* 34*  CREATININE 1.28 0.95 0.82 0.93 0.93  CALCIUM 9.7 9.1 9.5 9.6 9.5  MG 2.1 2.1 2.4  --   --   PHOS 5.7* 2.4 3.7  --   --     Liver Function Tests:  Recent Labs Lab 05/28/14 0310  AST 13  ALT 15  ALKPHOS 65  BILITOT 0.4  PROT 6.2  ALBUMIN 3.1*   No results for input(s): LIPASE, AMYLASE in the last 168 hours. No results for input(s): AMMONIA in the last 168 hours.  CBC:  Recent Labs Lab 05/24/14 1001 05/24/14 2019 05/25/14 0230 05/26/14 0215 05/27/14 0322  WBC 12.1* 12.9* 11.6* 15.0* 14.5*  NEUTROABS 9.7*  --   --   --   --   HGB 15.8 15.6 15.3 14.4 15.0  HCT 49.0 49.1 49.8 45.1 46.2  MCV 99.0 101.2* 102.3* 97.2 97.3  PLT 209 207 214 169 184    Cardiac Enzymes:  Recent Labs Lab 05/24/14 1001 05/24/14 1539  TROPONINI <0.30 <0.30    BNP: BNP (last 3 results)  Recent  Labs  08/20/13 1539 05/24/14 1001 05/30/14 0407  PROBNP 2069.0* 2785.0* 4076.0*     Other results:  Imaging: No results found.   Medications:     Scheduled Medications: . antiseptic oral rinse  7 mL Mouth Rinse BID  . B-complex with vitamin C  1 tablet Oral Daily  . budesonide-formoterol  2 puff Inhalation BID  . digoxin  0.0625 mg Oral Daily  . gabapentin  300 mg Oral QHS  . pantoprazole  40 mg Oral Daily  . rivaroxaban  20 mg Oral Q supper  . torsemide  20 mg Oral QPM  . torsemide  40 mg Oral Q breakfast    Infusions:    PRN Medications: acetaminophen, fentaNYL, levalbuterol, oxyCODONE-acetaminophen   Assessment:   1. Acute on chronic respiratory failure 2. Acute/chonic systolic HF - ischemic CM EF 20-25%. St Jude ICD.  3. Chronic AF now with  RVR     --on Xarelto at home 4. R pleural effusion 5. Hyperkalemia 6. CAD s/p previous stenting 7. AECOPD - on home O2  Plan/Discussion:    I think main issue here is acute on chronic HF on top of severe underlying COPD. Volume status much improved. Appears dry. Will hold torsemide today and resume 20 bid in am which is his home regimen.   He could not tolerate bisprolol due to hypotension. Will try to get him back on his home regimen of losartan and Toprol. Start losartan 25 and Toprol 12.5.   Can move to tele.   Length of Stay: 6 Arvilla Meresaniel Bensimhon MD 05/30/2014, 9:20 AM  Advanced Heart Failure Team Pager [phone] (M-F; 7a - 4p)  Please contact [facility] for night-coverage after hours (4p -7a ) and weekends on [email or web address]

## 2014-05-30 NOTE — Progress Notes (Signed)
[facility] TEAM 1 - Stepdown/ICU TEAM Progress Note  [name] KVQ:[id number] DOB: December 28, 1950 DOA: 05/24/2014 PCP: [name],ASHISH, MD  Admit HPI / Brief Narrative: 63 year old male WM PMHx  CAD s/p several stents, CHF (LCEF 20-25% 07/2013), COPD ( On Hm O2 @ 2L followed by Dr. [name], PFTs suggestive of restrictive disease), and Afib refractory to drugs and cardioversion, on rivaroxaban who had been feeling ill for several months prior to admission. About one month prior he began having chest pain, sob, and peripheral edema. He had been severely limited at home, unable to perform ADLs without becoming profoundly dyspneic. The morning of 10/28 he called his sister to take him to ED, however he was so dyspneic she had to call EMS. In ED he was SOB and hypotensive with chest and upper back pain. He was sent for CTA to r/o aortic dissection. CT showed large bilateral effusions R>L with small R PTX. PCCM has been called for admission.   HPI/Subjective: 11/3 Can speak only in 2-3 word sentence, On Hm O2 @ 2L(suppose to be 24hr/dy), C/O back pain, refractory cough.     Assessment/Plan: Bilateral pleural effusions R>L w/ small R PTX -likely 2nd to decompensated CHF  - thoracentesis not performed due to small volume;cont w/ attempts to diurese  - f/u CXR suggests small area of loculation on the Rt. Obtain PCXR in the a.m.   Acute on chronic systolic and diastolic CHF - ischemic CM -LVEF 20-25% - echo 1/15 - diuresis being limited by hypotension; Cardiology following and titrating meds -11/3 BNP= 4076  COPD with acute exacerbation - ongoing tobacco use  -wheezing on exam  - follow w/ nebs - MUST quite smoking completely -Solumedrol 60mg  BID  -Xopenex TID -continue full 7 days of antibiotics  S/P AICD  -Followed by Dr. [name]   LUL Lung nodule on CT chest  -Awaiting comment from Pulmonary - 1.5 x 1.2cm pleural based - increased in size since Sept 2012 w/ appearance concerning for lung  CA -awaiting pulmonology recommendations on biopsy vs serial studies   Parox Atrial fibrillation with acute RVR -continue rivaroxaban - failed cardioversion several times  - rate controlled off amiodarone at present(diltiazem made hypotensive) -.digoxin loaded per Cards   Hypotension  -Care w/ diuresis   H/o CAD -troponin negative x 2 - denies cp   Code Status: FULL Family Communication: no family present at time of exam Disposition Plan: SDU due to hypotension     Consultants: Dr. Arvilla Meresaniel Bensimhon (Cardiology) Dr.Wesam Molli KnockYacoub (PCCM)  Procedure/Significant Events: 1/25 echocardiogram;- Left ventricle: mildly dilated. LVEF=  20% to 25%. dyskinesis of the mid-distalinferoseptal myocardium. akinesis of apical myocardium. hypokinesis of basal-midinferolateral and inferior myocardium. Akinesis of the distalinferior myocardium.  -(grade 2 diastolicdysfunction). - Ventricular septum: Septal motion showed abnormal function and dyssynergy. -Left atrium: moderately dilated. - Right ventricle: Pacer wire or catheter noted in rightventricle. - Right atrium: Central venous pressure: 8mm Hg (est). 10/28 CTA chest > Bilateral effusion, R>L. Small R PTX. LUL nodule.     Culture 10/28 blood right/left hand negative 10/28 MRSA by PCR negative  Antibiotics: azithro 10/28 Rocephin 10/28  DVT prophylaxis: Xarelto   Devices NA   LINES / TUBES:      Continuous Infusions:   Objective: VITAL SIGNS: Temp: 98.2 F (36.8 C) (11/03 0738) Temp Source: Oral (11/03 0738) BP: 125/94 mmHg (11/03 0738) Pulse Rate: 117 (11/03 0738) SPO2; FIO2:   Intake/Output Summary (Last 24 hours) at 05/30/14 1258 Last data filed at  05/30/14 0700  Gross per 24 hour  Intake    600 ml  Output   1800 ml  Net  -1200 ml     Exam: General: A/O x 4, Mod Respiratory distress 2dary to Acute on Chronic COPD  Lungs: Diffuse Expitory wheezing Lt> Rt negative crackles Cardiovascular:  Irregular Irregular rate and rhythm without murmur gallop or rub normal S1 and S2 Abdomen: Nontender, nondistended, soft, bowel sounds positive, no rebound, no ascites, no appreciable mass Extremities: No significant cyanosis, clubbing, or edema bilateral lower extremities  Data Reviewed: Basic Metabolic Panel:  Recent Labs Lab 05/25/14 0230 05/26/14 0215 05/27/14 0322 05/28/14 0310 05/30/14 0407  NA 140 137 139 142 141  K 5.5* 4.7 4.8 4.7 4.2  CL 98 95* 93* 95* 89*  CO2 32 32 38* 39* 44*  GLUCOSE 129* 203* 153* 149* 96  BUN 32* 28* 32* 38* 34*  CREATININE 1.28 0.95 0.82 0.93 0.93  CALCIUM 9.7 9.1 9.5 9.6 9.5  MG 2.1 2.1 2.4  --   --   PHOS 5.7* 2.4 3.7  --   --    Liver Function Tests:  Recent Labs Lab 05/28/14 0310  AST 13  ALT 15  ALKPHOS 65  BILITOT 0.4  PROT 6.2  ALBUMIN 3.1*   No results for input(s): LIPASE, AMYLASE in the last 168 hours. No results for input(s): AMMONIA in the last 168 hours. CBC:  Recent Labs Lab 05/24/14 1001 05/24/14 2019 05/25/14 0230 05/26/14 0215 05/27/14 0322  WBC 12.1* 12.9* 11.6* 15.0* 14.5*  NEUTROABS 9.7*  --   --   --   --   HGB 15.8 15.6 15.3 14.4 15.0  HCT 49.0 49.1 49.8 45.1 46.2  MCV 99.0 101.2* 102.3* 97.2 97.3  PLT 209 207 214 169 184   Cardiac Enzymes:  Recent Labs Lab 05/24/14 1001 05/24/14 1539  TROPONINI <0.30 <0.30   BNP (last 3 results)  Recent Labs  08/20/13 1539 05/24/14 1001 05/30/14 0407  PROBNP 2069.0* 2785.0* 4076.0*   CBG: No results for input(s): GLUCAP in the last 168 hours.  Recent Results (from the past 240 hour(s))  Blood culture (routine x 2)     Status: None   Collection Time: 05/24/14  4:21 PM  Result Value Ref Range Status   Specimen Description BLOOD RIGHT HAND  Final   Special Requests   Final    BOTTLES DRAWN AEROBIC AND ANAEROBIC 10CC PT ON ROCEPHIN ZITHROMAX   Culture  Setup Time   Final    05/24/2014 22:41 Performed at [facility]    Culture   Final     NO GROWTH 5 DAYS Performed at [facility]    Report Status 05/30/2014 FINAL  Final  Blood culture (routine x 2)     Status: None   Collection Time: 05/24/14  4:34 PM  Result Value Ref Range Status   Specimen Description BLOOD LEFT HAND  Final   Special Requests   Final    BOTTLES DRAWN AEROBIC AND ANAEROBIC 10CC PT ON ROCEPHIN ZITHROMAX   Culture  Setup Time   Final    05/24/2014 22:40 Performed at [facility]    Culture   Final    NO GROWTH 5 DAYS Performed at [facility]    Report Status 05/30/2014 FINAL  Final  MRSA PCR Screening     Status: None   Collection Time: 05/24/14  7:16 PM  Result Value Ref Range Status   MRSA by PCR NEGATIVE  NEGATIVE Final    Comment:        The GeneXpert MRSA Assay (FDA approved for NASAL specimens only), is one component of a comprehensive MRSA colonization surveillance program. It is not intended to diagnose MRSA infection nor to guide or monitor treatment for MRSA infections.     Studies:  Recent x-ray studies have been reviewed in detail by the Attending Physician  Scheduled Meds:  Scheduled Meds: . antiseptic oral rinse  7 mL Mouth Rinse BID  . B-complex with vitamin C  1 tablet Oral Daily  . budesonide-formoterol  2 puff Inhalation BID  . digoxin  0.0625 mg Oral Daily  . gabapentin  300 mg Oral QHS  . losartan  25 mg Oral Daily  . metoprolol succinate  12.5 mg Oral Daily  . pantoprazole  40 mg Oral Daily  . rivaroxaban  20 mg Oral Q supper  . [START ON 05/31/2014] torsemide  20 mg Oral BID    Time spent on care of this patient: 40 mins   Drema Dallas , MD   [facility] Office  [phone] Pager [phone]  On-Call/Text Page:      [email or web address]      password [email or web address]  If 7PM-7AM, please contact night-coverage [email or web address] Password [email or web address] 05/30/2014, 12:58 PM   LOS: 6 days

## 2014-05-31 ENCOUNTER — Inpatient Hospital Stay (HOSPITAL_COMMUNITY): Payer: Medicare Other

## 2014-05-31 LAB — CBC WITH DIFFERENTIAL/PLATELET
Basophils Absolute: 0 10*3/uL (ref 0.0–0.1)
Basophils Relative: 0 % (ref 0–1)
EOS PCT: 0 % (ref 0–5)
Eosinophils Absolute: 0 10*3/uL (ref 0.0–0.7)
HEMATOCRIT: 48.7 % (ref 39.0–52.0)
Hemoglobin: 16.2 g/dL (ref 13.0–17.0)
LYMPHS PCT: 4 % — AB (ref 12–46)
Lymphs Abs: 0.5 10*3/uL — ABNORMAL LOW (ref 0.7–4.0)
MCH: 32 pg (ref 26.0–34.0)
MCHC: 33.3 g/dL (ref 30.0–36.0)
MCV: 96.1 fL (ref 78.0–100.0)
MONO ABS: 0.1 10*3/uL (ref 0.1–1.0)
Monocytes Relative: 1 % — ABNORMAL LOW (ref 3–12)
Neutro Abs: 12.2 10*3/uL — ABNORMAL HIGH (ref 1.7–7.7)
Neutrophils Relative %: 95 % — ABNORMAL HIGH (ref 43–77)
Platelets: 224 10*3/uL (ref 150–400)
RBC: 5.07 MIL/uL (ref 4.22–5.81)
RDW: 12.9 % (ref 11.5–15.5)
WBC: 12.7 10*3/uL — ABNORMAL HIGH (ref 4.0–10.5)

## 2014-05-31 LAB — MAGNESIUM: Magnesium: 2.3 mg/dL (ref 1.5–2.5)

## 2014-05-31 LAB — COMPREHENSIVE METABOLIC PANEL
ALT: 15 U/L (ref 0–53)
AST: 14 U/L (ref 0–37)
Albumin: 3.1 g/dL — ABNORMAL LOW (ref 3.5–5.2)
Alkaline Phosphatase: 72 U/L (ref 39–117)
Anion gap: 7 (ref 5–15)
BUN: 31 mg/dL — ABNORMAL HIGH (ref 6–23)
CHLORIDE: 91 meq/L — AB (ref 96–112)
CO2: 39 mEq/L — ABNORMAL HIGH (ref 19–32)
Calcium: 9.7 mg/dL (ref 8.4–10.5)
Creatinine, Ser: 0.83 mg/dL (ref 0.50–1.35)
GFR calc Af Amer: >90 mL/min (ref >90)
GFR calc non Af Amer: >90 mL/min (ref >90)
Glucose, Bld: 150 mg/dL — ABNORMAL HIGH (ref 70–99)
Potassium: 5 mEq/L (ref 3.7–5.3)
Sodium: 137 mEq/L (ref 137–147)
Total Bilirubin: 1.5 mg/dL — ABNORMAL HIGH (ref 0.3–1.2)
Total Protein: 6.6 g/dL (ref 6.0–8.3)

## 2014-05-31 MED ORDER — LORAZEPAM 1 MG PO TABS
1.0000 mg | ORAL_TABLET | Freq: Once | ORAL | Status: AC
  Administered 2014-05-31: 1 mg via ORAL
  Filled 2014-05-31: qty 1

## 2014-05-31 MED ORDER — LEVALBUTEROL HCL 0.63 MG/3ML IN NEBU: 0.6300 mg | INHALATION_SOLUTION | Freq: Four times a day (QID) | RESPIRATORY_TRACT | Status: DC | PRN

## 2014-05-31 MED ORDER — SODIUM CHLORIDE 0.9 % IV BOLUS (SEPSIS)
500.0000 mL | Freq: Once | INTRAVENOUS | Status: AC
  Administered 2014-05-31: 500 mL via INTRAVENOUS

## 2014-05-31 MED ORDER — PREDNISONE 20 MG PO TABS
40.0000 mg | ORAL_TABLET | Freq: Every day | ORAL | Status: DC
  Administered 2014-06-01 – 2014-06-02 (×2): 40 mg via ORAL
  Filled 2014-05-31 (×3): qty 2

## 2014-05-31 NOTE — Progress Notes (Addendum)
[facility] TEAM 1 - Stepdown/ICU TEAM Progress Note  [name] ZOX:[id number] DOB: [birth year] DOA: 05/24/2014 PCP: [name],ASHISH, MD  Admit HPI / Brief Narrative: 63 year old male WM PMHx  CAD s/p several stents, CHF (LCEF 20-25% 07/2013), COPD ( On Hm O2 @ 2L followed by Dr. [name], PFTs suggestive of restrictive disease), and Afib refractory to drugs and cardioversion, on rivaroxaban who had been feeling ill for several months prior to admission. About one month prior he began having chest pain, sob, and peripheral edema. He had been severely limited at home, unable to perform ADLs without becoming profoundly dyspneic. The morning of 10/28 he called his sister to take him to ED, however he was so dyspneic she had to call EMS. In ED he was SOB and hypotensive with chest and upper back pain. He was sent for CTA to r/o aortic dissection. CT showed large bilateral effusions R>L with small R PTX. PCCM has been called for admission.   HPI/Subjective: Lightheaded when ambulating. No complaints of dyspnea.  Assessment/Plan: Bilateral pleural effusions R>L w/ small R PTX - likely 2nd to decompensated CHF - thoracentesis not performed due to small volume -continues to have a moderate size effusion on the right-suspecting this may be secondary to heart failure however despite aggressive diuresis, it has not resolved -as oxygen level is stable, no urgent need for thoracentesis  Acute on chronic systolic and diastolic CHF - ischemic CM -LVEF 20-25% - echo 1/15 - diuresis now limited by orthostatic hypotension Cardiology following and titrating meds -11/3 BNP= 4076  Orthostatic hypotension -Diuretics held today and 500 mL bolus being given by CHF team  COPD with acute exacerbation - ongoing tobacco use  - follow w/ nebs - MUST quite smoking completely -Solumedrol 60mg  BID- we'll taper down to prednisone as he is no longer wheezing and is also now off of oxygen  -Xopenex TID -was given 1  day of Zithromax and Rocephin on 10/28-currently does not appear to have any acute bronchitis or pneumonia-no fevers  S/P AICD  -Followed by Dr. [name]   LUL Lung nodule on CT chest  -Awaiting comment from Pulmonary - 1.5 x 1.2cm pleural based - increased in size since Sept 2012 w/ appearance concerning for lung CA -awaiting pulmonology recommendations on biopsy vs serial studies   Parox Atrial fibrillation with acute RVR -continue rivaroxaban - failed cardioversion several times  - rate elevated today- will allow cardiology to manage- at this time may be due to dehydration -.digoxin loaded per Cards   Hypotension  -Care w/ diuresis   H/o CAD -troponin negative x 2 - denies cp   Code Status: FULL Family Communication: no family present at time of exam Disposition Plan: home when stable    Consultants: Dr. Arvilla Meresaniel Bensimhon (Cardiology) Dr.Wesam Molli KnockYacoub (PCCM)  Procedure/Significant Events: 1/25 echocardiogram;- Left ventricle: mildly dilated. LVEF=  20% to 25%. dyskinesis of the mid-distalinferoseptal myocardium. akinesis of apical myocardium. hypokinesis of basal-midinferolateral and inferior myocardium. Akinesis of the distalinferior myocardium.  -(grade 2 diastolicdysfunction). - Ventricular septum: Septal motion showed abnormal function and dyssynergy. -Left atrium: moderately dilated. - Right ventricle: Pacer wire or catheter noted in rightventricle. - Right atrium: Central venous pressure: 8mm Hg (est). 10/28 CTA chest > Bilateral effusion, R>L. Small R PTX. LUL nodule.     Culture 10/28 blood right/left hand negative 10/28 MRSA by PCR negative  Antibiotics: azithro 10/28 Rocephin 10/28  DVT prophylaxis: Xarelto   Devices NA   LINES / TUBES:   Objective:  VITAL SIGNS: Temp: 97.6 F (36.4 C) (11/04 0959) Temp Source: Oral (11/04 0959) BP: 101/65 mmHg (11/04 0959) Pulse Rate: 104 (11/04 0959) SPO2; FIO2:   Intake/Output Summary (Last  24 hours) at 05/31/14 1309 Last data filed at 05/31/14 1100  Gross per 24 hour  Intake   1080 ml  Output   2000 ml  Net   -920 ml     Exam: General: A/O x 4, Mod Respiratory distress 2dary to Acute on Chronic COPD  Lungs: CTA b/l .  Cardiovascular: Irregular Irregular rate and rhythm without murmur gallop or rub normal S1 and S2 Abdomen: Nontender, nondistended, soft, bowel sounds positive, no rebound, no ascites, no appreciable mass Extremities: No significant cyanosis, clubbing, or edema bilateral lower extremities  Data Reviewed: Basic Metabolic Panel:  Recent Labs Lab 05/25/14 0230 05/26/14 0215 05/27/14 0322 05/28/14 0310 05/30/14 0407 05/31/14 0503  NA 140 137 139 142 141 137  K 5.5* 4.7 4.8 4.7 4.2 5.0  CL 98 95* 93* 95* 89* 91*  CO2 32 32 38* 39* 44* 39*  GLUCOSE 129* 203* 153* 149* 96 150*  BUN 32* 28* 32* 38* 34* 31*  CREATININE 1.28 0.95 0.82 0.93 0.93 0.83  CALCIUM 9.7 9.1 9.5 9.6 9.5 9.7  MG 2.1 2.1 2.4  --   --  2.3  PHOS 5.7* 2.4 3.7  --   --   --    Liver Function Tests:  Recent Labs Lab 05/28/14 0310 05/31/14 0503  AST 13 14  ALT 15 15  ALKPHOS 65 72  BILITOT 0.4 1.5*  PROT 6.2 6.6  ALBUMIN 3.1* 3.1*   No results for input(s): LIPASE, AMYLASE in the last 168 hours. No results for input(s): AMMONIA in the last 168 hours. CBC:  Recent Labs Lab 05/25/14 0230 05/26/14 0215 05/27/14 0322 05/30/14 1410 05/31/14 0503  WBC 11.6* 15.0* 14.5* 9.8 12.7*  NEUTROABS  --   --   --  7.6 12.2*  HGB 15.3 14.4 15.0 16.3 16.2  HCT 49.8 45.1 46.2 51.1 48.7  MCV 102.3* 97.2 97.3 98.3 96.1  PLT 214 169 184 206 224   Cardiac Enzymes:  Recent Labs Lab 05/24/14 1539  TROPONINI <0.30   BNP (last 3 results)  Recent Labs  08/20/13 1539 05/24/14 1001 05/30/14 0407  PROBNP 2069.0* 2785.0* 4076.0*   CBG: No results for input(s): GLUCAP in the last 168 hours.  Recent Results (from the past 240 hour(s))  Blood culture (routine x 2)     Status:  None   Collection Time: 05/24/14  4:21 PM  Result Value Ref Range Status   Specimen Description BLOOD RIGHT HAND  Final   Special Requests   Final    BOTTLES DRAWN AEROBIC AND ANAEROBIC 10CC PT ON ROCEPHIN ZITHROMAX   Culture  Setup Time   Final    05/24/2014 22:41 Performed at [facility]    Culture   Final    NO GROWTH 5 DAYS Performed at [facility]    Report Status 05/30/2014 FINAL  Final  Blood culture (routine x 2)     Status: None   Collection Time: 05/24/14  4:34 PM  Result Value Ref Range Status   Specimen Description BLOOD LEFT HAND  Final   Special Requests   Final    BOTTLES DRAWN AEROBIC AND ANAEROBIC 10CC PT ON ROCEPHIN ZITHROMAX   Culture  Setup Time   Final    05/24/2014 22:40 Performed at [facility]  Final    NO GROWTH 5 DAYS Performed at [facility]    Report Status 05/30/2014 FINAL  Final  MRSA PCR Screening     Status: None   Collection Time: 05/24/14  7:16 PM  Result Value Ref Range Status   MRSA by PCR NEGATIVE NEGATIVE Final    Comment:        The GeneXpert MRSA Assay (FDA approved for NASAL specimens only), is one component of a comprehensive MRSA colonization surveillance program. It is not intended to diagnose MRSA infection nor to guide or monitor treatment for MRSA infections.     Studies:  Recent x-ray studies have been reviewed in detail by the Attending Physician  Scheduled Meds:  Scheduled Meds: . antiseptic oral rinse  7 mL Mouth Rinse BID  . B-complex with vitamin C  1 tablet Oral Daily  . budesonide-formoterol  2 puff Inhalation BID  . digoxin  0.0625 mg Oral Daily  . gabapentin  300 mg Oral QHS  . levalbuterol  0.63 mg Nebulization Q8H  . methylPREDNISolone (SOLU-MEDROL) injection  60 mg Intravenous Q12H  . pantoprazole  40 mg Oral Daily  . rivaroxaban  20 mg Oral Q supper    Time spent on care of this patient: 30 mins   Kasidee Voisin , MD   On-Call/Text Page:       [email or web address]      password [email or web address]  If 7PM-7AM, please contact night-coverage [email or web address] Password [email or web address] 05/31/2014, 1:09 PM   LOS: 7 days

## 2014-05-31 NOTE — Progress Notes (Signed)
Patient ID: [name] LemonsReuben S [name], male   DOB: May 27, 1951, 63 y.o.   MRN: [id number] Advanced Heart Failure Rounding Note   Subjective:    63 year old male with history of CAD/ischemic cardiomyopathy, CHF (LVEF 20-25% - echo 1/15), chronic atrial fib (failed amio and DC-CV), COPD, [facility] c/o SOB with chest and upper back pain. CT chest showed bilateral effusion R>L and small R pneumothorax. Felt to have combination of CHF and COPD flare.  CCM decided against thoracentesis (not enough fluid on ultrasound).   Yesterday he was restarted on  losartan 25 and Toprol 12.5. This am SBP low. Weight unchanged.   Complaining of increased dizziness. Denies SOB.     Objective:   Weight Range:  Vital Signs:   Temp:  [97.9 F (36.6 C)-98.2 F (36.8 C)] 98 F (36.7 C) (11/04 0529) Pulse Rate:  [96-123] 104 (11/04 0529) Resp:  [18-20] 18 (11/04 0529) BP: (87-120)/(53-81) 96/54 mmHg (11/04 0529) SpO2:  [93 %-98 %] 97 % (11/04 0557) FiO2 (%):  [36 %] 36 % (11/03 1218) Weight:  [186 lb (84.369 kg)-186 lb 12.8 oz (84.732 kg)] 186 lb (84.369 kg) (11/04 0529) Last BM Date: 05/30/14  Weight change: Filed Weights   05/29/14 0500 05/30/14 2326 05/31/14 0529  Weight: 190 lb 0.6 oz (86.2 kg) 186 lb 12.8 oz (84.732 kg) 186 lb (84.369 kg)    Intake/Output:   Intake/Output Summary (Last 24 hours) at 05/31/14 0942 Last data filed at 05/31/14 0530  Gross per 24 hour  Intake    960 ml  Output   1600 ml  Net   -640 ml     Physical Exam: BP sitting 102/60 standing 80/60  General:  Lying in bed. No resp difficulty HEENT: normal Neck: supple. JVP flat Carotids 2+ bilat; no bruits. No lymphadenopathy or thryomegaly appreciated. Cor: PMI laterally displaced. IRR, IRR  Lungs: poor air movement throughout. Decreased BS at bases.  No wheezing Abdomen: obese soft, nontender, no distention. No hepatosplenomegaly. No bruits or masses. Good bowel sounds. Extremities: no cyanosis, clubbing, rash,   edema Neuro: alert & orientedx3, cranial nerves grossly intact. moves all 4 extremities w/o difficulty. Affect pleasant  Telemetry: AF 80-90s with PVCs  Labs: Basic Metabolic Panel:  Recent Labs Lab 05/25/14 0230 05/26/14 0215 05/27/14 0322 05/28/14 0310 05/30/14 0407 05/31/14 0503  NA 140 137 139 142 141 137  K 5.5* 4.7 4.8 4.7 4.2 5.0  CL 98 95* 93* 95* 89* 91*  CO2 32 32 38* 39* 44* 39*  GLUCOSE 129* 203* 153* 149* 96 150*  BUN 32* 28* 32* 38* 34* 31*  CREATININE 1.28 0.95 0.82 0.93 0.93 0.83  CALCIUM 9.7 9.1 9.5 9.6 9.5 9.7  MG 2.1 2.1 2.4  --   --  2.3  PHOS 5.7* 2.4 3.7  --   --   --     Liver Function Tests:  Recent Labs Lab 05/28/14 0310 05/31/14 0503  AST 13 14  ALT 15 15  ALKPHOS 65 72  BILITOT 0.4 1.5*  PROT 6.2 6.6  ALBUMIN 3.1* 3.1*   No results for input(s): LIPASE, AMYLASE in the last 168 hours. No results for input(s): AMMONIA in the last 168 hours.  CBC:  Recent Labs Lab 05/24/14 1001  05/25/14 0230 05/26/14 0215 05/27/14 0322 05/30/14 1410 05/31/14 0503  WBC 12.1*  < > 11.6* 15.0* 14.5* 9.8 12.7*  NEUTROABS 9.7*  --   --   --   --  7.6 12.2*  HGB 15.8  < > 15.3 14.4 15.0 16.3 16.2  HCT 49.0  < > 49.8 45.1 46.2 51.1 48.7  MCV 99.0  < > 102.3* 97.2 97.3 98.3 96.1  PLT 209  < > 214 169 184 206 224  < > = values in this interval not displayed.  Cardiac Enzymes:  Recent Labs Lab 05/24/14 1001 05/24/14 1539  TROPONINI <0.30 <0.30    BNP: BNP (last 3 results)  Recent Labs  08/20/13 1539 05/24/14 1001 05/30/14 0407  PROBNP 2069.0* 2785.0* 4076.0*     Other results:  Imaging: Dg Chest Port 1 View  05/31/2014   CLINICAL DATA:  Shortness of breath, pleural effusion.  EXAM: PORTABLE CHEST - 1 VIEW  COMPARISON:  05/28/2014 and 05/27/2014  FINDINGS: Two lead right-sided pacemaker is unchanged. Lungs are adequately inflated and demonstrate persistent opacification over the right mid to lower lung compatible with known moderate  effusion likely with associated atelectasis. Cannot completely exclude infection in the right mid to lower lung. There is slightly better aeration in the right mid to lower lung. Stable cardiomegaly. Remainder the exam is unchanged.  IMPRESSION: Persistent opacification with slight improvement over the right mid to lower lung compatible with known moderate size effusion and associated atelectasis. Cannot completely exclude infection.   Electronically Signed   By: [name] M.D.   On: 05/31/2014 08:09     Medications:     Scheduled Medications: . antiseptic oral rinse  7 mL Mouth Rinse BID  . B-complex with vitamin C  1 tablet Oral Daily  . budesonide-formoterol  2 puff Inhalation BID  . digoxin  0.0625 mg Oral Daily  . gabapentin  300 mg Oral QHS  . levalbuterol  0.63 mg Nebulization Q8H  . losartan  25 mg Oral Daily  . methylPREDNISolone (SOLU-MEDROL) injection  60 mg Intravenous Q12H  . metoprolol succinate  12.5 mg Oral Daily  . pantoprazole  40 mg Oral Daily  . rivaroxaban  20 mg Oral Q supper  . torsemide  20 mg Oral BID    Infusions:    PRN Medications: acetaminophen, fentaNYL, oxyCODONE-acetaminophen   Assessment:   1. Acute on chronic respiratory failure 2. Acute/chonic systolic HF - ischemic CM EF 20-25%. [employer] ICD.  3. Chronic AF now with RVR     --on Xarelto at home 4. R pleural effusion 5. Hyperkalemia 6. CAD s/p previous stenting 7. AE COPD - on home O2 8. Orthostatic Hypotension.   Plan/Discussion:    Main issue appears to be acute on chronic HF on top of severe underlying COPD.   Orthostatic hypotension noted. I checked BP sitting 102/60 standing 80/60 with significant dizziness notified requiring assistance to stand. Give 500 cc NS now. Hold losartan, toprol, and torsemide. Weight unchanged in last 24 hours   Renal function ok.   WBC elevated likely related to steroids.   Will make f/u in HF clinic the end of next week.  Length of Stay:  7 CLEGG,AMY NP-C  05/31/2014, 9:42 AM  Advanced Heart Failure Team Pager [phone] (M-F; 7a - 4p)  Please contact [facility] for night-coverage after hours (4p -7a ) and weekends on [email or web address]   Patient seen and examined with Tonye Becket, NP. We discussed all aspects of the encounter. I agree with the assessment and plan as stated above.   He is dry and orthostatic. Agree with NS bolus. Hold HF agents. Given severe HF and COPD, suspect it will be difficult to optimize his respiratory status.  Truman HaywardDaniel Jonnatan Hanners,MD 6:24 PM

## 2014-05-31 NOTE — Progress Notes (Signed)
Pt. C/o anxiety. Requesting something to help him relax. Slight tremors noted in arms. Text page sent to on call NP, K. Schorr. RN will continue to monitor pt. For changes in condition. [name], [name]

## 2014-06-01 LAB — COMPREHENSIVE METABOLIC PANEL
ALBUMIN: 3 g/dL — AB (ref 3.5–5.2)
ALT: 17 U/L (ref 0–53)
ANION GAP: 11 (ref 5–15)
AST: 15 U/L (ref 0–37)
Alkaline Phosphatase: 64 U/L (ref 39–117)
BILIRUBIN TOTAL: 1.1 mg/dL (ref 0.3–1.2)
BUN: 31 mg/dL — AB (ref 6–23)
CHLORIDE: 96 meq/L (ref 96–112)
CO2: 33 mEq/L — ABNORMAL HIGH (ref 19–32)
CREATININE: 0.76 mg/dL (ref 0.50–1.35)
Calcium: 9.5 mg/dL (ref 8.4–10.5)
GFR calc non Af Amer: >90 mL/min (ref >90)
Glucose, Bld: 133 mg/dL — ABNORMAL HIGH (ref 70–99)
Potassium: 5 mEq/L (ref 3.7–5.3)
Sodium: 140 mEq/L (ref 137–147)
Total Protein: 6.2 g/dL (ref 6.0–8.3)

## 2014-06-01 LAB — CBC WITH DIFFERENTIAL/PLATELET
Basophils Absolute: 0 10*3/uL (ref 0.0–0.1)
Basophils Relative: 0 % (ref 0–1)
Eosinophils Absolute: 0 10*3/uL (ref 0.0–0.7)
Eosinophils Relative: 0 % (ref 0–5)
HEMATOCRIT: 46.3 % (ref 39.0–52.0)
Hemoglobin: 15.5 g/dL (ref 13.0–17.0)
LYMPHS PCT: 4 % — AB (ref 12–46)
Lymphs Abs: 0.8 10*3/uL (ref 0.7–4.0)
MCH: 31.8 pg (ref 26.0–34.0)
MCHC: 33.5 g/dL (ref 30.0–36.0)
MCV: 95.1 fL (ref 78.0–100.0)
Monocytes Absolute: 0.9 10*3/uL (ref 0.1–1.0)
Monocytes Relative: 4 % (ref 3–12)
NEUTROS ABS: 19.6 10*3/uL — AB (ref 1.7–7.7)
NEUTROS PCT: 92 % — AB (ref 43–77)
Platelets: 234 10*3/uL (ref 150–400)
RBC: 4.87 MIL/uL (ref 4.22–5.81)
RDW: 13.2 % (ref 11.5–15.5)
WBC: 21.3 10*3/uL — ABNORMAL HIGH (ref 4.0–10.5)

## 2014-06-01 MED ORDER — METOPROLOL SUCCINATE 12.5 MG HALF TABLET
12.5000 mg | ORAL_TABLET | Freq: Every day | ORAL | Status: DC
  Administered 2014-06-01: 12.5 mg via ORAL
  Filled 2014-06-01 (×2): qty 1

## 2014-06-01 MED ORDER — LEVOFLOXACIN IN D5W 750 MG/150ML IV SOLN
750.0000 mg | INTRAVENOUS | Status: DC
  Administered 2014-06-01 – 2014-06-02 (×2): 750 mg via INTRAVENOUS
  Filled 2014-06-01 (×2): qty 150

## 2014-06-01 MED ORDER — METOPROLOL TARTRATE 1 MG/ML IV SOLN
2.5000 mg | Freq: Four times a day (QID) | INTRAVENOUS | Status: DC
  Administered 2014-06-01 – 2014-06-02 (×3): 2.5 mg via INTRAVENOUS
  Filled 2014-06-01 (×7): qty 5

## 2014-06-01 MED ORDER — LORAZEPAM 0.5 MG PO TABS
0.5000 mg | ORAL_TABLET | ORAL | Status: DC | PRN
  Administered 2014-06-01: 1 mg via ORAL
  Filled 2014-06-01: qty 2

## 2014-06-01 NOTE — Progress Notes (Signed)
ANTIBIOTIC CONSULT NOTE - INITIAL  Pharmacy Consult for levaquin Indication: rule out pneumonia  No Known Allergies  Patient Measurements: Height: 5\' 7"  (170.2 cm) Weight: 186 lb 8.2 oz (84.6 kg) IBW/kg (Calculated) : 66.1 Adjusted Body Weight:   Vital Signs: Temp: 98.2 F (36.8 C) (11/05 0603) Temp Source: Oral (11/05 0603) BP: 104/76 mmHg (11/05 0603) Pulse Rate: 53 (11/05 0603) Intake/Output from previous day: 11/04 0701 - 11/05 0700 In: 1800 [P.O.:1800] Out: 1900 [Urine:1900] Intake/Output from this shift:    Labs:  Recent Labs  05/30/14 0407 05/30/14 1410 05/31/14 0503 06/01/14 0512  WBC  --  9.8 12.7* 21.3*  HGB  --  16.3 16.2 15.5  PLT  --  206 224 234  CREATININE 0.93  --  0.83 0.76   Estimated Creatinine Clearance: 97 mL/min (by C-G formula based on Cr of 0.76). No results for input(s): VANCOTROUGH, VANCOPEAK, VANCORANDOM, GENTTROUGH, GENTPEAK, GENTRANDOM, TOBRATROUGH, TOBRAPEAK, TOBRARND, AMIKACINPEAK, AMIKACINTROU, AMIKACIN in the last 72 hours.   Microbiology: Recent Results (from the past 720 hour(s))  Blood culture (routine x 2)     Status: None   Collection Time: 05/24/14  4:21 PM  Result Value Ref Range Status   Specimen Description BLOOD RIGHT HAND  Final   Special Requests   Final    BOTTLES DRAWN AEROBIC AND ANAEROBIC 10CC PT ON ROCEPHIN ZITHROMAX   Culture  Setup Time   Final    05/24/2014 22:41 Performed at [facility]    Culture   Final    NO GROWTH 5 DAYS Performed at [facility]    Report Status 05/30/2014 FINAL  Final  Blood culture (routine x 2)     Status: None   Collection Time: 05/24/14  4:34 PM  Result Value Ref Range Status   Specimen Description BLOOD LEFT HAND  Final   Special Requests   Final    BOTTLES DRAWN AEROBIC AND ANAEROBIC 10CC PT ON ROCEPHIN ZITHROMAX   Culture  Setup Time   Final    05/24/2014 22:40 Performed at [facility]    Culture   Final    NO GROWTH 5 DAYS Performed at  [facility]    Report Status 05/30/2014 FINAL  Final  MRSA PCR Screening     Status: None   Collection Time: 05/24/14  7:16 PM  Result Value Ref Range Status   MRSA by PCR NEGATIVE NEGATIVE Final    Comment:        The GeneXpert MRSA Assay (FDA approved for NASAL specimens only), is one component of a comprehensive MRSA colonization surveillance program. It is not intended to diagnose MRSA infection nor to guide or monitor treatment for MRSA infections.    Medical History: Past Medical History  Diagnosis Date  . Ischemic cardiomyopathy     cardiomyopathy ischemic  . Paroxysmal atrial fibrillation   . Renal calculi   . 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 Chronic systolic dysfunction of left ventricle   . Coronary artery disease   . Chronic pain     chronic left abdominal pain  . ICD (implantable cardioverter-defibrillator), single, in situ     Medications:  Anti-infectives    Start     Dose/Rate Route Frequency Ordered Stop   06/01/14 0900  levofloxacin (LEVAQUIN) IVPB 750 mg     750 mg100 mL/hr over 90 Minutes Intravenous Every 24 hours 06/01/14 0818     05/24/14 1345  cefTRIAXone (ROCEPHIN) 1  g in dextrose 5 % 50 mL IVPB     1 g100 mL/hr over 30 Minutes Intravenous  Once 05/24/14 1341 05/24/14 1547   05/24/14 1345  azithromycin (ZITHROMAX) 500 mg in dextrose 5 % 250 mL IVPB     500 mg250 mL/hr over 60 Minutes Intravenous  Once 05/24/14 1341 05/24/14 1617     Assessment: 63 yom initially admitted with SOB. Received 1x doses of azithromycin + ceftriaxone 10/28. Pt is afebrile and WBC is trending up (also on steroids). To start empiric levaquin for pneumonia coverage. Pt has good renal function with Scr 0.76 which has been stable.   Goal of Therapy:  Eradication of infection  Plan:  1. Levaquin 750mg  IV Q24H 2. F/u renal fxn, C&S, clinical status and LOT  Delany Steury, Drake Leachachel Lynn 06/01/2014,8:19 AM

## 2014-06-01 NOTE — Progress Notes (Signed)
Patient ID: [name] LemonsReuben S [name], male   DOB: Dec 12, 1950, 63 y.o.   MRN: [id number] Advanced Heart Failure Rounding Note   Subjective:    63 year old male with history of CAD/ischemic cardiomyopathy, CHF (LVEF 20-25% - echo 1/15), chronic atrial fib (failed amio and DC-CV), COPD, [facility] c/o SOB with chest and upper back pain. CT chest showed bilateral effusion R>L and small R pneumothorax. Felt to have combination of CHF and COPD flare.  CCM decided against thoracentesis (not enough fluid on ultrasound).   Yesterday Orthostatic hypotension noted. HF meds held and he was given 500 cc NS. BP imporved. Weight unchanged. Diuretics on hold.   Feels much better But still coughing. Denies SOB or dizziness  WBC elevated 21K  Objective:   Weight Range:  Vital Signs:   Temp:  [97.2 F (36.2 C)-98.5 F (36.9 C)] 98.2 F (36.8 C) (11/05 0603) Pulse Rate:  [53-104] 53 (11/05 0603) Resp:  [17-21] 17 (11/05 0603) BP: (97-122)/(65-98) 104/76 mmHg (11/05 0603) SpO2:  [93 %-94 %] 94 % (11/05 0603) Weight:  [186 lb 8.2 oz (84.6 kg)] 186 lb 8.2 oz (84.6 kg) (11/05 0603) Last BM Date: 06/01/14  Weight change: Filed Weights   05/30/14 2326 05/31/14 0529 06/01/14 0603  Weight: 186 lb 12.8 oz (84.732 kg) 186 lb (84.369 kg) 186 lb 8.2 oz (84.6 kg)    Intake/Output:   Intake/Output Summary (Last 24 hours) at 06/01/14 0958 Last data filed at 06/01/14 0950  Gross per 24 hour  Intake   1680 ml  Output   2000 ml  Net   -320 ml     Physical Exam:  General:  Lying in bed. No resp difficulty HEENT: normal Neck: supple. JVP 5-6 Carotids 2+ bilat; no bruits. No lymphadenopathy or thryomegaly appreciated. Cor: PMI laterally displaced. IRR, IRR  Lungs:  RML RLL decreased.  Abdom: obese soft, nontender, no distention. No hepatosplenomegaly. No bruits or masses. Good bowel sounds. Extremities: no cyanosis, clubbing, rash,  edema Neuro: alert & orientedx3, cranial nerves grossly intact.  moves all 4 extremities w/o difficulty. Affect pleasant  Telemetry: AF 80-90s with PVCs  Labs: Basic Metabolic Panel:  Recent Labs Lab 05/26/14 0215 05/27/14 0322 05/28/14 0310 05/30/14 0407 05/31/14 0503 06/01/14 0512  NA 137 139 142 141 137 140  K 4.7 4.8 4.7 4.2 5.0 5.0  CL 95* 93* 95* 89* 91* 96  CO2 32 38* 39* 44* 39* 33*  GLUCOSE 203* 153* 149* 96 150* 133*  BUN 28* 32* 38* 34* 31* 31*  CREATININE 0.95 0.82 0.93 0.93 0.83 0.76  CALCIUM 9.1 9.5 9.6 9.5 9.7 9.5  MG 2.1 2.4  --   --  2.3  --   PHOS 2.4 3.7  --   --   --   --     Liver Function Tests:  Recent Labs Lab 05/28/14 0310 05/31/14 0503 06/01/14 0512  AST 13 14 15   ALT 15 15 17   ALKPHOS 65 72 64  BILITOT 0.4 1.5* 1.1  PROT 6.2 6.6 6.2  ALBUMIN 3.1* 3.1* 3.0*   No results for input(s): LIPASE, AMYLASE in the last 168 hours. No results for input(s): AMMONIA in the last 168 hours.  CBC:  Recent Labs Lab 05/26/14 0215 05/27/14 0322 05/30/14 1410 05/31/14 0503 06/01/14 0512  WBC 15.0* 14.5* 9.8 12.7* 21.3*  NEUTROABS  --   --  7.6 12.2* 19.6*  HGB 14.4 15.0 16.3 16.2 15.5  HCT 45.1 46.2 51.1 48.7 46.3  MCV 97.2 97.3 98.3 96.1 95.1  PLT 169 184 206 224 234    Cardiac Enzymes: No results for input(s): CKTOTAL, CKMB, CKMBINDEX, TROPONINI in the last 168 hours.  BNP: BNP (last 3 results)  Recent Labs  08/20/13 1539 05/24/14 1001 05/30/14 0407  PROBNP 2069.0* 2785.0* 4076.0*     Other results:  Imaging: Dg Chest Port 1 View  05/31/2014   CLINICAL DATA:  Shortness of breath, pleural effusion.  EXAM: PORTABLE CHEST - 1 VIEW  COMPARISON:  05/28/2014 and 05/27/2014  FINDINGS: Two lead right-sided pacemaker is unchanged. Lungs are adequately inflated and demonstrate persistent opacification over the right mid to lower lung compatible with known moderate effusion likely with associated atelectasis. Cannot completely exclude infection in the right mid to lower lung. There is slightly better  aeration in the right mid to lower lung. Stable cardiomegaly. Remainder the exam is unchanged.  IMPRESSION: Persistent opacification with slight improvement over the right mid to lower lung compatible with known moderate size effusion and associated atelectasis. Cannot completely exclude infection.   Electronically Signed   By: Elberta Fortisaniel  Boyle M.D.   On: 05/31/2014 08:09     Medications:     Scheduled Medications: . antiseptic oral rinse  7 mL Mouth Rinse BID  . B-complex with vitamin C  1 tablet Oral Daily  . budesonide-formoterol  2 puff Inhalation BID  . digoxin  0.0625 mg Oral Daily  . gabapentin  300 mg Oral QHS  . levofloxacin (LEVAQUIN) IV  750 mg Intravenous Q24H  . pantoprazole  40 mg Oral Daily  . predniSONE  40 mg Oral Q breakfast  . rivaroxaban  20 mg Oral Q supper    Infusions:    PRN Medications: acetaminophen, fentaNYL, levalbuterol, oxyCODONE-acetaminophen   Assessment:   1. Acute on chronic respiratory failure 2. Acute/chonic systolic HF - ischemic CM EF 20-25%. St Jude ICD.  3. Chronic AF now with RVR     --on Xarelto at home 4. R pleural effusion 5. Hyperkalemia 6. CAD s/p previous stenting 7. AE COPD - on home O2 8. Orthostatic Hypotension.- resolved.    Plan/Discussion:     Orthostatic hypotension resolved. Add back Toprol 12.5 mg daily. Hold diuretics today. Consider restarting tomorrow.    Renal function ok.   WBC elevated . Antibiotics restarted per PCP.   Will make f/u in HF clinic the end of next week and then [facility] due to long [facility].  Length of Stay: 8 CLEGG,AMY NP-C  06/01/2014, 9:58 AM  Advanced Heart Failure Team Pager [phone] (M-F; 7a - 4p)  Please contact [facility] for night-coverage after hours (4p -7a ) and weekends on [email or web address]  Patient seen and examined with Tonye BecketAmy Clegg, NP. We discussed all aspects of the encounter. I agree with the assessment and plan as stated above.   Looks much better today.  Agree with holding diuretics one more day. Restart Toprol at low dose. Continue Eliquis for AF.   Primary team starting abx for ? PNA.  Yousra Ivens,MD 4:15 PM

## 2014-06-01 NOTE — Progress Notes (Signed)
[name] - Stepdown/ICU TEAM Progress Note  [name] LemonsReuben S [name] ZOX:[id number] DOB: June 17, 1951 DOA: 05/24/2014 PCP: [name],ASHISH, MD  Admit HPI / Brief Narrative: 63 year old male WM PMHx  CAD s/p several stents, CHF (LCEF 20-25% 07/2013), COPD ( On Hm O2 @ 2L followed by Dr. [name], PFTs suggestive of restrictive disease), and Afib refractory to drugs and cardioversion, on rivaroxaban who had been feeling ill for several months prior to admission. About one month prior he began having chest pain, sob, and peripheral edema. He had been severely limited at home, unable to perform ADLs without becoming profoundly dyspneic. The morning of 10/28 he called his sister to take him to ED, however he was so dyspneic she had to call EMS. In ED he was SOB and hypotensive with chest and upper back pain. He was sent for CTA to r/o aortic dissection. CT showed large bilateral effusions R>L with small R PTX. PCCM has been called for admission.   HPI/Subjective: Increasing cough today.   Assessment/Plan: Bilateral pleural effusions R>L w/ small R PTX - likely 2nd to decompensated CHF - thoracentesis not performed due to small volume -continues to have a moderate size effusion on the right-suspecting this may be secondary to heart failure however despite aggressive diuresis, it has not resolved -as oxygen level is stable, no urgent need for thoracentesis  Acute on chronic systolic and diastolic CHF - ischemic CM -LVEF 20-25% - echo 1/15 - diuresis now limited by orthostatic hypotension Cardiology following and titrating meds -11/3 BNP= 4076  Orthostatic hypotension -Diuretics held and 500 mL bolus given by CHF team  COPD with acute exacerbation - ongoing tobacco use  - follow w/ nebs - MUST quite smoking completely -Solumedrol 60mg  BID- we'll taper down to prednisone as he is no longer wheezing and is also now off of oxygen  -Xopenex TID -was given 1 day of Zithromax and Rocephin on 10/28 -  now developing leukocytosis and recurrent cough- have started Levaquin   S/P AICD  -Followed by Dr. [name]   LUL Lung nodule on CT chest  -Awaiting comment from Pulmonary - 1.5 x 1.2cm pleural based - increased in size since Sept 2012 w/ appearance concerning for lung CA -awaiting pulmonology recommendations on biopsy vs serial studies   Parox Atrial fibrillation with acute RVR -continue rivaroxaban - failed cardioversion several times  - rate controlled -.digoxin loaded per Cards   Hypotension  -Care w/ diuresis   H/o CAD -troponin negative x 2 - denies cp   Code Status: FULL Family Communication: no family present at time of exam Disposition Plan: home when stable    Consultants: Dr. Arvilla Meresaniel Bensimhon (Cardiology) Dr.Wesam Molli KnockYacoub (PCCM)  Procedure/Significant Events: 1/25 echocardiogram;- Left ventricle: mildly dilated. LVEF=  20% to 25%. dyskinesis of the mid-distalinferoseptal myocardium. akinesis of apical myocardium. hypokinesis of basal-midinferolateral and inferior myocardium. Akinesis of the distalinferior myocardium.  -(grade 2 diastolicdysfunction). - Ventricular septum: Septal motion showed abnormal function and dyssynergy. -Left atrium: moderately dilated. - Right ventricle: Pacer wire or catheter noted in rightventricle. - Right atrium: Central venous pressure: 8mm Hg (est). 10/28 CTA chest > Bilateral effusion, R>L. Small R PTX. LUL nodule.     Culture 10/28 blood right/left hand negative 10/28 MRSA by PCR negative  Antibiotics: azithro 10/28 Rocephin 10/28  DVT prophylaxis: Xarelto   Devices NA   LINES / TUBES:   Objective: VITAL SIGNS: Temp: 97.3 F (36.3 C) (11/05 1402) Temp Source: Oral (11/05 1402) BP: 107/82 mmHg (11/05 1402)  Pulse Rate: 97 (11/05 1402) SPO2; FIO2:   Intake/Output Summary (Last 24 hours) at 06/01/14 1559 Last data filed at 06/01/14 1408  Gross per 24 hour  Intake   1540 ml  Output   1600 ml    Net    -60 ml     Exam: General: A/O x 4, Mod Respiratory distress 2dary to Acute on Chronic COPD  Lungs: CTA b/l .  Cardiovascular: Irregular Irregular rate and rhythm without murmur gallop or rub normal S1 and S2 Abdomen: Nontender, nondistended, soft, bowel sounds positive, no rebound, no ascites, no appreciable mass Extremities: No significant cyanosis, clubbing, or edema bilateral lower extremities  Data Reviewed: Basic Metabolic Panel:  Recent Labs Lab 05/26/14 0215 05/27/14 0322 05/28/14 0310 05/30/14 0407 05/31/14 0503 06/01/14 0512  NA 137 139 142 141 137 140  K 4.7 4.8 4.7 4.2 5.0 5.0  CL 95* 93* 95* 89* 91* 96  CO2 32 38* 39* 44* 39* 33*  GLUCOSE 203* 153* 149* 96 150* 133*  BUN 28* 32* 38* 34* 31* 31*  CREATININE 0.95 0.82 0.93 0.93 0.83 0.76  CALCIUM 9.1 9.5 9.6 9.5 9.7 9.5  MG 2.1 2.4  --   --  2.3  --   PHOS 2.4 3.7  --   --   --   --    Liver Function Tests:  Recent Labs Lab 05/28/14 0310 05/31/14 0503 06/01/14 0512  AST 13 14 15   ALT 15 15 17   ALKPHOS 65 72 64  BILITOT 0.4 1.5* 1.1  PROT 6.2 6.6 6.2  ALBUMIN 3.1* 3.1* 3.0*   No results for input(s): LIPASE, AMYLASE in the last 168 hours. No results for input(s): AMMONIA in the last 168 hours. CBC:  Recent Labs Lab 05/26/14 0215 05/27/14 0322 05/30/14 1410 05/31/14 0503 06/01/14 0512  WBC 15.0* 14.5* 9.8 12.7* 21.3*  NEUTROABS  --   --  7.6 12.2* 19.6*  HGB 14.4 15.0 16.3 16.2 15.5  HCT 45.1 46.2 51.1 48.7 46.3  MCV 97.2 97.3 98.3 96.1 95.1  PLT 169 184 206 224 234   Cardiac Enzymes: No results for input(s): CKTOTAL, CKMB, CKMBINDEX, TROPONINI in the last 168 hours. BNP (last 3 results)  Recent Labs  08/20/13 1539 05/24/14 1001 05/30/14 0407  PROBNP 2069.0* 2785.0* 4076.0*   CBG: No results for input(s): GLUCAP in the last 168 hours.  Recent Results (from the past 240 hour(s))  Blood culture (routine x 2)     Status: None   Collection Time: 05/24/14  4:21 PM  Result  Value Ref Range Status   Specimen Description BLOOD RIGHT HAND  Final   Special Requests   Final    BOTTLES DRAWN AEROBIC AND ANAEROBIC 10CC PT ON ROCEPHIN ZITHROMAX   Culture  Setup Time   Final    05/24/2014 22:41 Performed at [facility]    Culture   Final    NO GROWTH 5 DAYS Performed at [facility]    Report Status 05/30/2014 FINAL  Final  Blood culture (routine x 2)     Status: None   Collection Time: 05/24/14  4:34 PM  Result Value Ref Range Status   Specimen Description BLOOD LEFT HAND  Final   Special Requests   Final    BOTTLES DRAWN AEROBIC AND ANAEROBIC 10CC PT ON ROCEPHIN ZITHROMAX   Culture  Setup Time   Final    05/24/2014 22:40 Performed at [facility]    Culture   Final  NO GROWTH 5 DAYS Performed at [facility]    Report Status 05/30/2014 FINAL  Final  MRSA PCR Screening     Status: None   Collection Time: 05/24/14  7:16 PM  Result Value Ref Range Status   MRSA by PCR NEGATIVE NEGATIVE Final    Comment:        The GeneXpert MRSA Assay (FDA approved for NASAL specimens only), is one component of a comprehensive MRSA colonization surveillance program. It is not intended to diagnose MRSA infection nor to guide or monitor treatment for MRSA infections.     Studies:  Recent x-ray studies have been reviewed in detail by the Attending Physician  Scheduled Meds:  Scheduled Meds: . antiseptic oral rinse  7 mL Mouth Rinse BID  . B-complex with vitamin C  1 tablet Oral Daily  . budesonide-formoterol  2 puff Inhalation BID  . digoxin  0.0625 mg Oral Daily  . gabapentin  300 mg Oral QHS  . levofloxacin (LEVAQUIN) IV  750 mg Intravenous Q24H  . metoprolol succinate  12.5 mg Oral Daily  . pantoprazole  40 mg Oral Daily  . predniSONE  40 mg Oral Q breakfast  . rivaroxaban  20 mg Oral Q supper    Time spent on care of this patient: 30 mins   Samanda Buske , MD   On-Call/Text Page:      [email or web address]      password  [email or web address]  If 7PM-7AM, please contact night-coverage [email or web address] Password [email or web address] 06/01/2014, 3:59 PM   LOS: 8 days

## 2014-06-02 ENCOUNTER — Other Ambulatory Visit: Payer: Self-pay

## 2014-06-02 DIAGNOSIS — R911 Solitary pulmonary nodule: Secondary | ICD-10-CM

## 2014-06-02 LAB — COMPREHENSIVE METABOLIC PANEL
ALK PHOS: 65 U/L (ref 39–117)
ALT: 18 U/L (ref 0–53)
ANION GAP: 13 (ref 5–15)
AST: 17 U/L (ref 0–37)
Albumin: 3.1 g/dL — ABNORMAL LOW (ref 3.5–5.2)
BUN: 36 mg/dL — AB (ref 6–23)
CALCIUM: 9.8 mg/dL (ref 8.4–10.5)
CO2: 31 mEq/L (ref 19–32)
Chloride: 95 mEq/L — ABNORMAL LOW (ref 96–112)
Creatinine, Ser: 0.93 mg/dL (ref 0.50–1.35)
GFR calc non Af Amer: 87 mL/min — ABNORMAL LOW (ref >90)
GLUCOSE: 121 mg/dL — AB (ref 70–99)
Potassium: 5.4 mEq/L — ABNORMAL HIGH (ref 3.7–5.3)
Sodium: 139 mEq/L (ref 137–147)
Total Bilirubin: 1 mg/dL (ref 0.3–1.2)
Total Protein: 6.6 g/dL (ref 6.0–8.3)

## 2014-06-02 LAB — CBC WITH DIFFERENTIAL/PLATELET
BASOS ABS: 0 10*3/uL (ref 0.0–0.1)
BASOS PCT: 0 % (ref 0–1)
Eosinophils Absolute: 0.1 10*3/uL (ref 0.0–0.7)
Eosinophils Relative: 0 % (ref 0–5)
HCT: 48.2 % (ref 39.0–52.0)
HEMOGLOBIN: 15.9 g/dL (ref 13.0–17.0)
LYMPHS PCT: 8 % — AB (ref 12–46)
Lymphs Abs: 1.6 10*3/uL (ref 0.7–4.0)
MCH: 32.3 pg (ref 26.0–34.0)
MCHC: 33 g/dL (ref 30.0–36.0)
MCV: 98 fL (ref 78.0–100.0)
MONO ABS: 1.6 10*3/uL — AB (ref 0.1–1.0)
MONOS PCT: 8 % (ref 3–12)
NEUTROS PCT: 84 % — AB (ref 43–77)
Neutro Abs: 15.9 10*3/uL — ABNORMAL HIGH (ref 1.7–7.7)
Platelets: 235 10*3/uL (ref 150–400)
RBC: 4.92 MIL/uL (ref 4.22–5.81)
RDW: 13.8 % (ref 11.5–15.5)
WBC: 19.1 10*3/uL — ABNORMAL HIGH (ref 4.0–10.5)

## 2014-06-02 LAB — DIGOXIN LEVEL: Digoxin Level: 0.4 ng/mL — ABNORMAL LOW (ref 0.8–2.0)

## 2014-06-02 MED ORDER — MORPHINE SULFATE 2 MG/ML IJ SOLN
1.0000 mg | INTRAMUSCULAR | Status: DC | PRN
  Administered 2014-06-02: 1 mg via INTRAVENOUS
  Filled 2014-06-02: qty 1

## 2014-06-02 MED ORDER — PREDNISONE 20 MG PO TABS
20.0000 mg | ORAL_TABLET | Freq: Every day | ORAL | Status: DC
  Filled 2014-06-02: qty 1

## 2014-06-02 MED ORDER — TORSEMIDE 20 MG PO TABS
20.0000 mg | ORAL_TABLET | Freq: Two times a day (BID) | ORAL | Status: DC
  Filled 2014-06-02: qty 1

## 2014-06-02 MED ORDER — LEVOFLOXACIN 750 MG PO TABS
750.0000 mg | ORAL_TABLET | Freq: Every day | ORAL | Status: DC
Start: 2014-06-02 — End: 2014-06-08

## 2014-06-02 MED ORDER — METOPROLOL SUCCINATE ER 25 MG PO TB24
25.0000 mg | ORAL_TABLET | Freq: Every day | ORAL | Status: DC
  Administered 2014-06-02: 25 mg via ORAL
  Filled 2014-06-02: qty 1

## 2014-06-02 MED ORDER — METOPROLOL SUCCINATE ER 25 MG PO TB24: 25.0000 mg | ORAL_TABLET | Freq: Every day | ORAL | Status: DC

## 2014-06-02 MED ORDER — PREDNISONE 20 MG PO TABS: 10.0000 mg | ORAL_TABLET | Freq: Every day | ORAL | Status: DC

## 2014-06-02 MED ORDER — LEVALBUTEROL TARTRATE 45 MCG/ACT IN AERO
1.0000 | INHALATION_SPRAY | Freq: Four times a day (QID) | RESPIRATORY_TRACT | Status: DC | PRN
Start: 2014-06-02 — End: 2014-09-22

## 2014-06-02 MED ORDER — ACETAMINOPHEN-CODEINE #3 300-30 MG PO TABS: 1.0000 | ORAL_TABLET | Freq: Four times a day (QID) | ORAL | Status: DC | PRN

## 2014-06-02 NOTE — Progress Notes (Signed)
Patient complained of chest pain and states it hurts when breathing but this has been first occurrence since admission. EKG done, VSS except blood pressure mildly soft, 94/50, rechecked - 110/70. Nitroglycerin tab not given due to standard orders of heart failure floor, if above 100, in which heart rate 120, not able to give Nitroglycerin tab and also not given due to soft blood pressure. Patient was given scheduled IV metoprolol 2.5 mg as ordered. Notified Dr. Mayford Knifeurner of this issue. Orders for PRN morphine IV to be given as ordered. Will continue to monitor patient to end of shift.
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
## 2014-06-02 NOTE — Plan of Care (Signed)
Problem: Phase II Progression Outcomes Goal: Pain controlled Outcome: Progressing Goal: Dyspnea controlled with activity Outcome: Progressing Goal: Walk in hall or up in chair TID Outcome: Progressing Goal: Discharge plan established Outcome: Progressing Goal: Fluid volume status improved Outcome: Progressing

## 2014-06-02 NOTE — Discharge Summary (Signed)
Physician Discharge Summary  [name] JAS:[id number] DOB: 04/07/1951 DOA: 05/24/2014  PCP: Kirstie Peri, MD  Admit date: 05/24/2014 Discharge date: 06/02/2014  Time spent: >45 minutes  Recommendations for Outpatient Follow-up:  1. F/u on pleural effusion  2. F/u  pulmonary nodule for biopsy 3. Resume Losartan when able- f/u on K+ level  Discharge Condition: Stable Diet recommendation: heart healthy  Discharge Diagnoses:  Principal Problem:   Acute on chronic combined systolic and diastolic CHF (congestive heart failure) Active Problems:   pneumothorax   Decompensated COPD with exacerbation (chronic obstructive pulmonary disease)   Atrial fibrillation with rapid ventricular response   Pleural effusion   AICD (automatic cardioverter/defibrillator) present    History of present illness:  63 year old male WM PMHx CAD s/p several stents, CHF (LCEF 20-25% 07/2013), COPD ( On Hm O2 @ 2L followed by Dr. Juanetta Gosling, PFTs suggestive of restrictive disease), and Afib refractory to drugs and cardioversion, on rivaroxaban who had been feeling ill for several months prior to admission. About one month prior he began having chest pain, sob, and peripheral edema. He had been severely limited at home, unable to perform ADLs without becoming profoundly dyspneic. The morning of 10/28 he called his sister to take him to ED, however he was so dyspneic she had to call EMS. In ED he was SOB and hypotensive with chest and upper back pain. He was sent for CTA to r/o aortic dissection. CT showed large bilateral effusions R>L with small R PTX. PCCM has been called for admission.  Hospital Course:  Bilateral pleural effusions R>L w/ small R PTX - likely 2nd to decompensated CHF - thoracentesis not performed due to small volume at that time -continues to have a moderate size effusion on the right-suspecting this may be secondary to heart failure (has been present since 1/15) however despite aggressive  diuresis, it has not resolved -as oxygen level is stable on room air and his is no longer symptomatic-  no urgent need for thoracentesis especially due to the fact that this is a chronic effusion  Acute on chronic systolic and diastolic CHF - ischemic CM -LVEF 20-25% - echo 1/15 - diuresis limited by orthostatic hypotension - Cardiology following and titrating meds - now in negative balance by 8700 cc and decrease in weight from 88.9 kg to 85.0 kg  Orthostatic hypotension -Diuretics held and 500 mL bolus given by CHF team - diuretics resumed by CHF team today  Hyperkalemia - K slightly elevated at 5.4 today- has been running high normal during his hospital stay- hold ARB for now  COPD with acute exacerbation - ongoing tobacco use - acute bronchitis vs pneumonia --advised that he must quite smoking completely -tapering steroids now -Xopenex prescribed-  cont Symbicort -was given 1 day of Zithromax and Rocephin on 10/28 and this was subsequently discontinued - began developing leukocytosis and recurrent cough- have started Levaquin - will complete a 5 day course  S/P AICD  -Followed by Dr. Ladona Ridgel   LUL Lung nodule on CT chest  -Awaiting comment from Pulmonary - 1.5 x 1.2cm pleural based - increased in size since Sept 2012 w/ appearance concerning for lung CA -pulmonology team recommendations outpt follow up with primary pulmonologist - I have discussed this plan with the patient and he is in agreement with it  Parox Atrial fibrillation with acute RVR -continue rivaroxaban - failed cardioversion several times - rate slightly uncontrolled - Digoxin loaded per Cards this admission -Toprol increased by cardiology today to 25  mg - advised to d/c home with current increase in dose  Hypotension  - BP stable on current meds- no longer orthostatic  H/o CAD -troponin negative x 2 - denies cp    Procedures: 1/25 echocardiogram;- Left ventricle: mildly dilated. LVEF= 20% to 25%.  dyskinesis of the mid-distalinferoseptal myocardium. akinesis of apical myocardium. hypokinesis of basal-midinferolateral and inferior myocardium. Akinesis of the distalinferior myocardium.  -(grade 2 diastolicdysfunction). - Ventricular septum: Septal motion showed abnormal function and dyssynergy. -Left atrium: moderately dilated. - Right ventricle: Pacer wire or catheter noted in rightventricle. - Right atrium: Central venous pressure: 8mm Hg (est). 10/28 CTA chest > Bilateral effusion, R>L. Small R PTX. LUL nodule.   Consultations:  Cardiology / CHF team  Discharge Exam: Filed Weights   05/31/14 0529 06/01/14 0603 06/02/14 0644  Weight: 84.369 kg (186 lb) 84.6 kg (186 lb 8.2 oz) 85.004 kg (187 lb 6.4 oz)   Filed Vitals:   06/02/14 1112  BP: 112/72  Pulse: 120  Temp:   Resp:     General: AAO x 3, no distress Cardiovascular: IIRR, no murmurs  Respiratory: clear to auscultation bilaterally GI: soft, non-tender, non-distended, bowel sound positive  Discharge Instructions You were cared for by a hospitalist during your hospital stay. If you have any questions about your discharge medications or the care you received while you were in the hospital after you are discharged, you can call the unit and asked to speak with the hospitalist on call if the hospitalist that took care of you is not available. Once you are discharged, your primary care physician will handle any further medical issues. Please note that NO REFILLS for any discharge medications will be authorized once you are discharged, as it is imperative that you return to your primary care physician (or establish a relationship with a primary care physician if you do not have one) for your aftercare needs so that they can reassess your need for medications and monitor your lab values.      Discharge Instructions    Diet - low sodium heart healthy    Complete by:  As directed      Increase activity slowly     Complete by:  As directed             Medication List    STOP taking these medications        losartan 50 MG tablet  Commonly known as:  COZAAR      TAKE these medications        B-complex with vitamin C tablet  Take 1 tablet by mouth daily.     budesonide-formoterol 160-4.5 MCG/ACT inhaler  Commonly known as:  SYMBICORT  Inhale 2 puffs into the lungs 2 (two) times daily.     digoxin 0.125 MG tablet  Commonly known as:  LANOXIN  Take 1 tablet (0.125 mg total) by mouth daily.     gabapentin 300 MG capsule  Commonly known as:  NEURONTIN  Take 300 mg by mouth at bedtime.     levalbuterol 45 MCG/ACT inhaler  Commonly known as:  XOPENEX HFA  Inhale 1-2 puffs into the lungs every 6 (six) hours as needed for wheezing.     levofloxacin 750 MG tablet  Commonly known as:  LEVAQUIN  Take 1 tablet (750 mg total) by mouth daily.     metoprolol succinate 25 MG 24 hr tablet  Commonly known as:  TOPROL-XL  Take 1 tablet (25 mg total) by mouth daily.  nitroGLYCERIN 0.4 MG SL tablet  Commonly known as:  NITROSTAT  Place 1 tablet (0.4 mg total) under the tongue every 5 (five) minutes as needed for chest pain.     omeprazole 20 MG capsule  Commonly known as:  PRILOSEC  Take 20 mg by mouth daily.     predniSONE 20 MG tablet  Commonly known as:  DELTASONE  Take 0.5 tablets (10 mg total) by mouth daily with breakfast.  Start taking on:  06/03/2014     rivaroxaban 20 MG Tabs tablet  Commonly known as:  XARELTO  Take 1 tablet (20 mg total) by mouth daily.     torsemide 20 MG tablet  Commonly known as:  DEMADEX  Take 1 tablet (20 mg total) by mouth 2 (two) times daily.       No Known Allergies    The results of significant diagnostics from this hospitalization (including imaging, microbiology, ancillary and laboratory) are listed below for reference.    Significant Diagnostic Studies: Dg Chest Port 1 View  05/31/2014   CLINICAL DATA:  Shortness of breath, pleural  effusion.  EXAM: PORTABLE CHEST - 1 VIEW  COMPARISON:  05/28/2014 and 05/27/2014  FINDINGS: Two lead right-sided pacemaker is unchanged. Lungs are adequately inflated and demonstrate persistent opacification over the right mid to lower lung compatible with known moderate effusion likely with associated atelectasis. Cannot completely exclude infection in the right mid to lower lung. There is slightly better aeration in the right mid to lower lung. Stable cardiomegaly. Remainder the exam is unchanged.  IMPRESSION: Persistent opacification with slight improvement over the right mid to lower lung compatible with known moderate size effusion and associated atelectasis. Cannot completely exclude infection.   Electronically Signed   By: Elberta Fortis M.D.   On: 05/31/2014 08:09   Dg Chest Port 1 View  05/28/2014   CLINICAL DATA:  CHF and right pleural effusion.  EXAM: PORTABLE CHEST - 1 VIEW  COMPARISON:  05/27/2014  FINDINGS: Right pleural effusion likely partially loculated and relatively small in volume. This fusion has likely decreased in size since 10/29 and 10/30. No evidence of pneumothorax. No significant residual edema with mild interstitial prominence remaining. Pacemaker shows stable appearance. Stable cardiomegaly.  IMPRESSION: Small right pleural effusion which is likely partially loculated.   Electronically Signed   By: Irish Lack M.D.   On: 05/28/2014 10:07   Dg Chest Port 1 View  05/27/2014   CLINICAL DATA:  Short of breath, atrial fibrillation  EXAM: PORTABLE CHEST - 1 VIEW  COMPARISON:  Radiograph 05/26/2014  FINDINGS: Left-sided pacemaker with 2 continuous leads overlies stable enlarged cardiac silhouette. There is low lung volumes bilateral pleural effusions which not significant changed from prior. Mild central venous congestion.  IMPRESSION: Cardiomegaly, bilateral pleural effusions in central venous congestion without significant change.   Electronically Signed   By: Genevive Bi M.D.    On: 05/27/2014 07:55   Dg Chest Port 1 View  05/26/2014   CLINICAL DATA:  Pleural effusion.  EXAM: PORTABLE CHEST - 1 VIEW  COMPARISON:  05/25/2014  FINDINGS: Right pacer remains in place, unchanged. There is cardiomegaly. Vascular congestion. Probable bilateral pleural effusions, right greater than left. Diffuse right lung airspace disease. Mild interstitial and airspace opacities on the left, slightly increased since prior study. Findings could represent asymmetric edema or infection.  IMPRESSION: Bilateral airspace disease, right greater than left. Airspace disease has slightly increased on the left since prior study. This could represent asymmetric edema or infection.  Small to moderate right pleural effusion. Suspect small left pleural effusion.   Electronically Signed   By: Charlett NoseKevin  Dover M.D.   On: 05/26/2014 07:46   Dg Chest Port 1 View  05/25/2014   CLINICAL DATA:  63 year old male with shortness of breath. Pleural effusion. Ischemic cardiomyopathy. Atrial fibrillation. Subsequent encounter.  EXAM: PORTABLE CHEST - 1 VIEW  COMPARISON:  05/24/2014 CT and chest x-ray  FINDINGS: Large right pleural effusion remains. The CT detected small right-sided pneumothorax is not as well delineated on the present plain film examination. This may be secondary to the small size of this pneumothorax an medial location.  CT detected left-sided pleural effusion not as well appreciated on present chest x-ray.  Enhancing left lower lobe nodule noted on CT not adequately assessed on the present plain film exam.  Basilar atelectatic changes.  Pulmonary vascular congestion.  AICD/biventricular pacer is in place with cardiomegaly.  Calcified aorta.  IMPRESSION: Large right pleural effusion remains. The CT detected small right-sided pneumothorax is not as well delineated on the present plain film examination. This may be secondary to the small size of this pneumothorax and medial location.  CT detected left-sided pleural  effusion not as well appreciated on present chest x-ray.  Enhancing left lower lobe nodule noted on CT not adequately assessed on the present plain film exam.  Basilar atelectatic changes.  Pulmonary vascular congestion.  AICD/biventricular pacer is in place with cardiomegaly.   Electronically Signed   By: Bridgett LarssonSteve  Olson M.D.   On: 05/25/2014 07:56   Dg Chest Portable 1 View  05/24/2014   CLINICAL DATA:  Shortness of breath.  EXAM: PORTABLE CHEST - 1 VIEW  COMPARISON:  August 20, 2013.  FINDINGS: Stable cardiomegaly. Right-sided PICC line is unchanged in position. Stable interstitial densities are noted throughout both lungs consistent with scarring or possibly superimposed pulmonary edema. No pneumothorax is noted. Increased right basilar opacity is noted concerning for pneumonia or atelectasis with associated pleural effusion.  IMPRESSION: Stable interstitial densities are noted throughout both lungs which may simply represent chronic scarring, but superimposed edema cannot be excluded. Increased right basilar opacity is noted concerning for worsening pneumonia or atelectasis with associated pleural effusion.   Electronically Signed   By: Roque LiasJames  Green M.D.   On: 05/24/2014 10:02   Ct Angio Chest Aorta W/cm &/or Wo/cm  05/24/2014   CLINICAL DATA:  Acute back pain. Ischemic cardiomyopathy. Shortness of breath. Productive cough.  Cyst more atrial fibrillation, on anti coagulation. Worsening shortness of breath. Possible sepsis.  EXAM: CT ANGIOGRAPHY CHEST, ABDOMEN AND PELVIS  TECHNIQUE: Multidetector CT imaging through the chest, abdomen and pelvis was performed using the standard protocol during bolus administration of intravenous contrast. Multiplanar reconstructed images and MIPs were obtained and reviewed to evaluate the vascular anatomy.  CONTRAST:  100 cc Omnipaque 350  COMPARISON:  None.  FINDINGS: CTA CHEST FINDINGS  Initial noncontrast CT images of the chest demonstrate no findings of acute intramural  hematoma in the aorta.  Dissection protocol systemic arterial phase images through the chest demonstrate only mild atherosclerotic calcification in the aorta. coronary artery stents and calcification noted. Cardiomegaly involving all 4 chambers. 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 Thinning of the left ventricular apex with a small pseudoaneurysm of the LV apex shown on images 75 through 80 of series 5 in the vicinity of the infarct marked.  There is enough contrast in the pulmonary arterial vasculature to indicate a  low likelihood of pulmonary embolus although the pulmonary embolus protocol was not specifically obtained.  A 1.5 by 1.2 cm pleural-based left upper lobe nodule is present posteriorly on image 23 of series 5. Comparing pre and post-contrast images this nodule is clearly enhancing.  Large right and moderate left pleural effusions. No separate enhancing pleural mass is visible nor is there significant pleural enhancement to specifically indicate an exudative effusion. However, there is a small right pneumothorax visible, with the gas component at under 5% of right hemithoracic volume.  Scattered lymph nodes in the chest include an 8 mm in short axis prevascular lymph node on image 24 of series 5 and a 1 cm right paratracheal lymph node on image 36 of series 5. There is considerable passive atelectasis in both lungs. Pacer device observed.  Review of the MIP images confirms the above findings.  CTA ABDOMEN AND PELVIS FINDINGS  The celiac trunk, SMA, and IMA appear patent. Mild intimal thickening and faint atherosclerotic calcification distally in the abdominal aorta intra and in the iliac vessels. There 2 renal arteries present bilaterally, which appear patent. No aneurysm or dissection observed.  Abnormal hypodense lesion in the left hepatic lobe measuring 1.4 by 1.0 cm on image 86 of series 5, nonspecific. Similar lesion  anteriorly in the left hepatic lobe, 1.0 by 0.8 cm on image 92 of series 5. These are similar to 03/08/2012 equivocal nodularity of the liver margin.  The spleen, pancreas, and adrenal glands appear unremarkable. No pathologic upper abdominal adenopathy is observed. No pathologic pelvic adenopathy is observed. Left mid kidney 4.4 by 4.1 cm cyst, image 117 of series 5. Right kidney lower pole scar.  No dilated bowel.  Appendix normal.  No free pelvic fluid.  Mildly prominent prostate gland 5.1 by 4.8 cm with some lower central calcifications.  Review of the MIP images confirms the above findings.  IMPRESSION: 1. No dissection or pulmonary embolus identified. 2. Large right pleural effusion with small pneumothorax component (hydropneumothorax). Moderate left pleural effusion. Scattered atelectasis in both lungs, mostly passive. 3. Enhancing nodule in the left upper lobe measuring 1.5 by 1.2 cm. This has increased from the previous reported size of 0.5 cm on 04/14/2011. Appearance is now concerning for lung cancer. Biopsy recommended when clinically feasible. 4. 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 5. Several hypodense lesions in the liver are similar to the 03/08/2012 exam and accordingly probably benign. 6. Left mid kidney cyst and the right kidney lower pole scarring.   Electronically Signed   By: Herbie Baltimore M.D.   On: 05/24/2014 16:42   Ct Angio Abd/pel W/ And/or W/o  05/24/2014   CLINICAL DATA:  Acute back pain. Ischemic cardiomyopathy. Shortness of breath. Productive cough.  Cyst more atrial fibrillation, on anti coagulation. Worsening shortness of breath. Possible sepsis.  EXAM: CT ANGIOGRAPHY CHEST, ABDOMEN AND PELVIS  TECHNIQUE: Multidetector CT imaging through the chest, abdomen and pelvis was performed using the standard protocol during bolus administration of intravenous contrast. Multiplanar reconstructed images and MIPs were  obtained and reviewed to evaluate the vascular anatomy.  CONTRAST:  100 cc Omnipaque 350  COMPARISON:  None.  FINDINGS: CTA CHEST FINDINGS  Initial noncontrast CT images of the chest demonstrate no findings of acute intramural hematoma in the aorta.  Dissection protocol systemic arterial phase images through the chest demonstrate only mild atherosclerotic calcification in the aorta. coronary artery stents and calcification noted. Cardiomegaly involving all 4  chambers. 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 Thinning of the left ventricular apex with a small pseudoaneurysm of the LV apex shown on images 75 through 80 of series 5 in the vicinity of the infarct marked.  There is enough contrast in the pulmonary arterial vasculature to indicate a low likelihood of pulmonary embolus although the pulmonary embolus protocol was not specifically obtained.  A 1.5 by 1.2 cm pleural-based left upper lobe nodule is present posteriorly on image 23 of series 5. Comparing pre and post-contrast images this nodule is clearly enhancing.  Large right and moderate left pleural effusions. No separate enhancing pleural mass is visible nor is there significant pleural enhancement to specifically indicate an exudative effusion. However, there is a small right pneumothorax visible, with the gas component at under 5% of right hemithoracic volume.  Scattered lymph nodes in the chest include an 8 mm in short axis prevascular lymph node on image 24 of series 5 and a 1 cm right paratracheal lymph node on image 36 of series 5. There is considerable passive atelectasis in both lungs. Pacer device observed.  Review of the MIP images confirms the above findings.  CTA ABDOMEN AND PELVIS FINDINGS  The celiac trunk, SMA, and IMA appear patent. Mild intimal thickening and faint atherosclerotic calcification distally in the abdominal aorta intra and in the iliac vessels.  There 2 renal arteries present bilaterally, which appear patent. No aneurysm or dissection observed.  Abnormal hypodense lesion in the left hepatic lobe measuring 1.4 by 1.0 cm on image 86 of series 5, nonspecific. Similar lesion anteriorly in the left hepatic lobe, 1.0 by 0.8 cm on image 92 of series 5. These are similar to 03/08/2012 equivocal nodularity of the liver margin.  The spleen, pancreas, and adrenal glands appear unremarkable. No pathologic upper abdominal adenopathy is observed. No pathologic pelvic adenopathy is observed. Left mid kidney 4.4 by 4.1 cm cyst, image 117 of series 5. Right kidney lower pole scar.  No dilated bowel.  Appendix normal.  No free pelvic fluid.  Mildly prominent prostate gland 5.1 by 4.8 cm with some lower central calcifications.  Review of the MIP images confirms the above findings.  IMPRESSION: 1. No dissection or pulmonary embolus identified. 2. Large right pleural effusion with small pneumothorax component (hydropneumothorax). Moderate left pleural effusion. Scattered atelectasis in both lungs, mostly passive. 3. Enhancing nodule in the left upper lobe measuring 1.5 by 1.2 cm. This has increased from the previous reported size of 0.5 cm on 04/14/2011. Appearance is now concerning for lung cancer. Biopsy recommended when clinically feasible. 4. 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 5. Several hypodense lesions in the liver are similar to the 03/08/2012 exam and accordingly probably benign. 6. Left mid kidney cyst and the right kidney lower pole scarring.   Electronically Signed   By: Herbie Baltimore M.D.   On: 05/24/2014 16:42    Microbiology: Recent Results (from the past 240 hour(s))  Blood culture (routine x 2)     Status: None   Collection Time: 05/24/14  4:21 PM  Result Value Ref Range Status   Specimen Description BLOOD RIGHT HAND  Final   Special Requests   Final    BOTTLES DRAWN AEROBIC  AND ANAEROBIC 10CC PT ON ROCEPHIN ZITHROMAX   Culture  Setup Time   Final    05/24/2014 22:41 Performed at [facility]  Culture   Final    NO GROWTH 5 DAYS Performed at [facility]    Report Status 05/30/2014 FINAL  Final  Blood culture (routine x 2)     Status: None   Collection Time: 05/24/14  4:34 PM  Result Value Ref Range Status   Specimen Description BLOOD LEFT HAND  Final   Special Requests   Final    BOTTLES DRAWN AEROBIC AND ANAEROBIC 10CC PT ON ROCEPHIN ZITHROMAX   Culture  Setup Time   Final    05/24/2014 22:40 Performed at [facility]    Culture   Final    NO GROWTH 5 DAYS Performed at [facility]    Report Status 05/30/2014 FINAL  Final  MRSA PCR Screening     Status: None   Collection Time: 05/24/14  7:16 PM  Result Value Ref Range Status   MRSA by PCR NEGATIVE NEGATIVE Final    Comment:        The GeneXpert MRSA Assay (FDA approved for NASAL specimens only), is one component of a comprehensive MRSA colonization surveillance program. It is not intended to diagnose MRSA infection nor to guide or monitor treatment for MRSA infections.     Labs: Basic Metabolic Panel:  Recent Labs Lab 05/27/14 0322 05/28/14 0310 05/30/14 0407 05/31/14 0503 06/01/14 0512 06/02/14 0315  NA 139 142 141 137 140 139  K 4.8 4.7 4.2 5.0 5.0 5.4*  CL 93* 95* 89* 91* 96 95*  CO2 38* 39* 44* 39* 33* 31  GLUCOSE 153* 149* 96 150* 133* 121*  BUN 32* 38* 34* 31* 31* 36*  CREATININE 0.82 0.93 0.93 0.83 0.76 0.93  CALCIUM 9.5 9.6 9.5 9.7 9.5 9.8  MG 2.4  --   --  2.3  --   --   PHOS 3.7  --   --   --   --   --    Liver Function Tests:  Recent Labs Lab 05/28/14 0310 05/31/14 0503 06/01/14 0512 06/02/14 0315  AST 13 14 15 17   ALT 15 15 17 18   ALKPHOS 65 72 64 65  BILITOT 0.4 1.5* 1.1 1.0  PROT 6.2 6.6 6.2 6.6  ALBUMIN 3.1* 3.1* 3.0* 3.1*   No results for input(s): LIPASE, AMYLASE in the last 168 hours. No results for  input(s): AMMONIA in the last 168 hours. CBC:  Recent Labs Lab 05/27/14 0322 05/30/14 1410 05/31/14 0503 06/01/14 0512 06/02/14 0315  WBC 14.5* 9.8 12.7* 21.3* 19.1*  NEUTROABS  --  7.6 12.2* 19.6* 15.9*  HGB 15.0 16.3 16.2 15.5 15.9  HCT 46.2 51.1 48.7 46.3 48.2  MCV 97.3 98.3 96.1 95.1 98.0  PLT 184 206 224 234 235   Cardiac Enzymes: No results for input(s): CKTOTAL, CKMB, CKMBINDEX, TROPONINI in the last 168 hours. BNP: BNP (last 3 results)  Recent Labs  08/20/13 1539 05/24/14 1001 05/30/14 0407  PROBNP 2069.0* 2785.0* 4076.0*   CBG: No results for input(s): GLUCAP in the last 168 hours.     SignedCalvert Cantor, MD [facility] 06/02/2014, 1:03 PM

## 2014-06-02 NOTE — Progress Notes (Signed)
Patient ID: [name] LemonsReuben S [name], male   DOB: August 09, 1950, 63 y.o.   MRN: [id number] Advanced Heart Failure Rounding Note   Subjective:    63 year old male with history of CAD/ischemic cardiomyopathy, CHF (LVEF 20-25% - echo 1/15), chronic atrial fib (failed amio and DC-CV), COPD, presented to [facility] ED 10/28 c/o SOB with chest and upper back pain. CT chest showed bilateral effusion R>L and small R pneumothorax. Felt to have combination of CHF and COPD flare.  CCM decided against thoracentesis (not enough fluid on ultrasound).   Diuretics and ARB on hold due to orthostasis. Feels much better. On abx.  Denies SOB or dizziness  HR still 105-115 AF   Objective:   Weight Range:  Vital Signs:   Temp:  [97.3 F (36.3 C)-98.3 F (36.8 C)] 98.3 F (36.8 C) (11/06 40980632) Pulse Rate:  [74-150] 100 (11/06 0520) Resp:  [20] 20 (11/06 11910632) BP: (98-114)/(66-100) 100/71 mmHg (11/06 0632) SpO2:  [92 %-95 %] 95 % (11/06 47820632) Weight:  [85.004 kg (187 lb 6.4 oz)] 85.004 kg (187 lb 6.4 oz) (11/06 0644) Last BM Date: 06/01/14  Weight change: Filed Weights   05/31/14 0529 06/01/14 0603 06/02/14 0644  Weight: 84.369 kg (186 lb) 84.6 kg (186 lb 8.2 oz) 85.004 kg (187 lb 6.4 oz)    Intake/Output:   Intake/Output Summary (Last 24 hours) at 06/02/14 0755 Last data filed at 06/02/14 [id number]  Gross per 24 hour  Intake   1060 ml  Output   1400 ml  Net   -340 ml     Physical Exam:  General:  Sitting in bed. No resp difficulty HEENT: normal Neck: supple. JVP 5-6 Carotids 2+ bilat; no bruits. No lymphadenopathy or thryomegaly appreciated. Cor: PMI laterally displaced. IRR, IRR  Lungs:  Clear but decreased throughout Abdom: obese soft, nontender, no distention. No hepatosplenomegaly. No bruits or masses. Good bowel sounds. Extremities: no cyanosis, clubbing, rash,  edema Neuro: alert & orientedx3, cranial nerves grossly intact. moves all 4 extremities w/o difficulty. Affect pleasant  Telemetry: AF 100-115s  with PVCs  Labs: Basic Metabolic Panel:  Recent Labs Lab 05/27/14 0322 05/28/14 0310 05/30/14 0407 05/31/14 0503 06/01/14 0512 06/02/14 0315  NA 139 142 141 137 140 139  K 4.8 4.7 4.2 5.0 5.0 5.4*  CL 93* 95* 89* 91* 96 95*  CO2 38* 39* 44* 39* 33* 31  GLUCOSE 153* 149* 96 150* 133* 121*  BUN 32* 38* 34* 31* 31* 36*  CREATININE 0.82 0.93 0.93 0.83 0.76 0.93  CALCIUM 9.5 9.6 9.5 9.7 9.5 9.8  MG 2.4  --   --  2.3  --   --   PHOS 3.7  --   --   --   --   --     Liver Function Tests:  Recent Labs Lab 05/28/14 0310 05/31/14 0503 06/01/14 0512 06/02/14 0315  AST 13 14 15 17   ALT 15 15 17 18   ALKPHOS 65 72 64 65  BILITOT 0.4 1.5* 1.1 1.0  PROT 6.2 6.6 6.2 6.6  ALBUMIN 3.1* 3.1* 3.0* 3.1*   No results for input(s): LIPASE, AMYLASE in the last 168 hours. No results for input(s): AMMONIA in the last 168 hours.  CBC:  Recent Labs Lab 05/27/14 0322 05/30/14 1410 05/31/14 0503 06/01/14 0512 06/02/14 0315  WBC 14.5* 9.8 12.7* 21.3* 19.1*  NEUTROABS  --  7.6 12.2* 19.6* 15.9*  HGB 15.0 16.3 16.2 15.5 15.9  HCT 46.2 51.1 48.7 46.3 48.2  MCV 97.3  98.3 96.1 95.1 98.0  PLT 184 206 224 234 235    Cardiac Enzymes: No results for input(s): CKTOTAL, CKMB, CKMBINDEX, TROPONINI in the last 168 hours.  BNP: BNP (last 3 results)  Recent Labs  08/20/13 1539 05/24/14 1001 05/30/14 0407  PROBNP 2069.0* 2785.0* 4076.0*     Other results:  Imaging: No results found.   Medications:     Scheduled Medications: . antiseptic oral rinse  7 mL Mouth Rinse BID  . B-complex with vitamin C  1 tablet Oral Daily  . budesonide-formoterol  2 puff Inhalation BID  . digoxin  0.0625 mg Oral Daily  . gabapentin  300 mg Oral QHS  . levofloxacin (LEVAQUIN) IV  750 mg Intravenous Q24H  . metoprolol  2.5 mg Intravenous 4 times per day  . metoprolol succinate  12.5 mg Oral Daily  . pantoprazole  40 mg Oral Daily  . [START ON 06/03/2014] predniSONE  20 mg Oral Q breakfast  .  rivaroxaban  20 mg Oral Q supper    Infusions:    PRN Medications: acetaminophen, fentaNYL, levalbuterol, LORazepam, morphine injection, oxyCODONE-acetaminophen   Assessment:   1. Acute on chronic respiratory failure 2. Acute/chonic systolic HF - ischemic CM EF 20-25%. [employer] Jude ICD.  3. Chronic AF now with RVR     --on Xarelto at home 4. R pleural effusion 5. Hyperkalemia 6. CAD s/p previous stenting 7. AE COPD - on home O2 8. Orthostatic Hypotension.- resolved.    Plan/Discussion:    Looks much better today. Can restart home demadex today at 20 bid. Increase Toprol 25. Would not restart losartant yet due to hyperkalemia. Continue Xarelto for AF.   Can go home from our standpoint with close f/u in HF clinic.   Primary team starting abx for PNA.  Akbar Sacra,MD 7:55 AM

## 2014-06-08 ENCOUNTER — Other Ambulatory Visit (HOSPITAL_COMMUNITY): Payer: Self-pay

## 2014-06-08 ENCOUNTER — Encounter (HOSPITAL_COMMUNITY): Payer: Self-pay

## 2014-06-08 ENCOUNTER — Ambulatory Visit (HOSPITAL_COMMUNITY)
Admit: 2014-06-08 | Discharge: 2014-06-08 | Disposition: A | Discharge status: Home / Self Care | Payer: Medicare Other | Source: Ambulatory Visit | Attending: Cardiology | Admitting: Cardiology

## 2014-06-08 VITALS — BP 100/68 | HR 77 | Resp 18 | Wt 183.2 lb

## 2014-06-08 DIAGNOSIS — Z79899 Other long term (current) drug therapy: Secondary | ICD-10-CM | POA: Diagnosis not present

## 2014-06-08 DIAGNOSIS — I5022 Chronic systolic (congestive) heart failure: Secondary | ICD-10-CM | POA: Insufficient documentation

## 2014-06-08 DIAGNOSIS — G8929 Other chronic pain: Secondary | ICD-10-CM | POA: Insufficient documentation

## 2014-06-08 DIAGNOSIS — Z7901 Long term (current) use of anticoagulants: Secondary | ICD-10-CM | POA: Diagnosis not present

## 2014-06-08 DIAGNOSIS — Z95810 Presence of automatic (implantable) cardiac defibrillator: Secondary | ICD-10-CM | POA: Diagnosis not present

## 2014-06-08 DIAGNOSIS — Z720 Tobacco use: Secondary | ICD-10-CM

## 2014-06-08 DIAGNOSIS — F17210 Nicotine dependence, cigarettes, uncomplicated: Secondary | ICD-10-CM | POA: Insufficient documentation

## 2014-06-08 DIAGNOSIS — Z955 Presence of coronary angioplasty implant and graft: Secondary | ICD-10-CM | POA: Insufficient documentation

## 2014-06-08 DIAGNOSIS — I4891 Unspecified atrial fibrillation: Secondary | ICD-10-CM | POA: Diagnosis not present

## 2014-06-08 DIAGNOSIS — I2510 Atherosclerotic heart disease of native coronary artery without angina pectoris: Secondary | ICD-10-CM | POA: Diagnosis not present

## 2014-06-08 DIAGNOSIS — Z8249 Family history of ischemic heart disease and other diseases of the circulatory system: Secondary | ICD-10-CM | POA: Insufficient documentation

## 2014-06-08 DIAGNOSIS — J449 Chronic obstructive pulmonary disease, unspecified: Secondary | ICD-10-CM | POA: Insufficient documentation

## 2014-06-08 DIAGNOSIS — R109 Unspecified abdominal pain: Secondary | ICD-10-CM | POA: Insufficient documentation

## 2014-06-08 DIAGNOSIS — I255 Ischemic cardiomyopathy: Secondary | ICD-10-CM | POA: Diagnosis not present

## 2014-06-08 DIAGNOSIS — I482 Chronic atrial fibrillation, unspecified: Secondary | ICD-10-CM

## 2014-06-08 LAB — BASIC METABOLIC PANEL
Anion gap: 8 (ref 5–15)
BUN: 25 mg/dL — ABNORMAL HIGH (ref 6–23)
CHLORIDE: 94 meq/L — AB (ref 96–112)
CO2: 37 mEq/L — ABNORMAL HIGH (ref 19–32)
Calcium: 9.4 mg/dL (ref 8.4–10.5)
Creatinine, Ser: 1.12 mg/dL (ref 0.50–1.35)
GFR, EST AFRICAN AMERICAN: 78 mL/min — AB (ref >90)
GFR, EST NON AFRICAN AMERICAN: 68 mL/min — AB (ref >90)
Glucose, Bld: 81 mg/dL (ref 70–99)
POTASSIUM: 3.9 meq/L (ref 3.7–5.3)
Sodium: 139 mEq/L (ref 137–147)

## 2014-06-08 MED ORDER — ATORVASTATIN CALCIUM 40 MG PO TABS: 40.0000 mg | ORAL_TABLET | Freq: Every day | ORAL | Status: DC

## 2014-06-08 MED ORDER — LOSARTAN POTASSIUM 50 MG PO TABS: 50.0000 mg | ORAL_TABLET | Freq: Every day | ORAL | Status: DC

## 2014-06-08 NOTE — Progress Notes (Signed)
Patient ID: [name] LemonsReuben S [name], male   DOB: 1951-05-15, 63 y.o.   MRN: [id number] PCP: Dr. [name] Primary Cardiologist: Dr. [name]  HPI: [name] is a 63 yo male with a history of CAD s/p multiple stents, ICM s.p ICD, chronic systolic heart failure, tobacco abuse, COPD and chronic atrial fibrillation.   Admitted 10/28-11/6/15 for A/C respiratory failure and A/C HF. Diuresed with IV lasix and found to have R pleural effusion too small to tap. Discharge weight 187 lbs.   Post Hospital Follow up for Heart Failure: Doing ok. Not completely sure about medications and not sure if he has been continued on losartan or not. Denies SOB, PND or CP. +chronic orthopnea (sleeps recliner).  Sleeps with 2L O2. Weight at home 184 lbs at home. Following a low salt diet and drinking more than 2L a day. Able to walk about 50-100 ft before stopping. Complaining of back pain. Smoked 1 cigarette since going home.   Studies: LHC 2008: moderately severe left ventricular dysfunction with elevated LVEDP and wall motion abnormalities, continued patency of the LAD stent with moderate ostial LAD disease, although not critical, continued patency of the circumflex stent with subtotal occlusion of the AV circumflex which is small, and mild to moderate ostial narrowing of the RCA with continued patency of the mid vessel stent Echo (07/2013): EF 20-25%, grade II DD  ROS: All systems negative except as listed in HPI, PMH and Problem List.  SH:  History   Social History  . Marital Status: Divorced    Spouse Name: N/A    Number of Children: N/A  . Years of Education: N/A   Occupational History  . RETIRED    Social History Main Topics  . Smoking status: Current Some Day Smoker -- 0.50 packs/day for 40 years    Types: Cigarettes  . Smokeless tobacco: Current User     Comment: he is not willing to quit-notes he smokes 3-4 cig's at least every other day 04-01-13  . Alcohol Use: No  . Drug Use: No  . Sexual Activity: No    Other Topics Concern  . Not on file   Social History Narrative   DISABLED ,DIVORCED    FH:  Family History  Problem Relation Age of Onset  . Coronary artery disease Other     family history of CAD    Past Medical History  Diagnosis Date  . Ischemic cardiomyopathy     cardiomyopathy ischemic  . Paroxysmal atrial fibrillation   . Renal calculi   . 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 Chronic systolic dysfunction of left ventricle   . Coronary artery disease   . Chronic pain     chronic left abdominal pain  . ICD (implantable cardioverter-defibrillator), single, in situ     Current Outpatient Prescriptions  Medication Sig Dispense Refill  . B Complex-C (B-COMPLEX WITH VITAMIN C) tablet Take 1 tablet by mouth daily.    . budesonide-formoterol (SYMBICORT) 160-4.5 MCG/ACT inhaler Inhale 2 puffs into the lungs 2 (two) times daily.    . colchicine 0.6 MG tablet Take 0.6 mg by mouth 2 (two) times daily as needed.    . digoxin (LANOXIN) 0.125 MG tablet Take 1 tablet (0.125 mg total) by mouth daily. 90 tablet 3  . gabapentin (NEURONTIN) 300 MG capsule Take 300 mg by mouth at bedtime.    . levalbuterol (XOPENEX HFA) 45 MCG/ACT inhaler Inhale 1-2 puffs into the lungs every 6 (six) hours  as needed for wheezing. 1 Inhaler 12  . losartan (COZAAR) 50 MG tablet Take 50 mg by mouth daily.    . metoprolol succinate (TOPROL-XL) 25 MG 24 hr tablet Take 1 tablet (25 mg total) by mouth daily. 30 tablet 0  . nitroGLYCERIN (NITROSTAT) 0.4 MG SL tablet Place 1 tablet (0.4 mg total) under the tongue every 5 (five) minutes as needed for chest pain. 25 tablet 2  . omeprazole (PRILOSEC) 20 MG capsule Take 20 mg by mouth daily.    . rivaroxaban (XARELTO) 20 MG TABS tablet Take 1 tablet (20 mg total) by mouth daily. 30 tablet 6  . torsemide (DEMADEX) 20 MG tablet Take 1 tablet (20 mg total) by mouth 2 (two) times daily. 120 tablet 2   No current facility-administered  medications for this encounter.    Filed Vitals:   06/08/14 0932  BP: 100/68  Pulse: 77  Resp: 18  Weight: 183 lb 4 oz (83.122 kg)  SpO2: 94%    PHYSICAL EXAM: General:  Elderly appearing. No resp difficulty HEENT: normal Neck: supple. JVP flat. Carotids 2+ bilaterally; no bruits. No lymphadenopathy or thryomegaly appreciated. Cor: PMI normal. IR and rate controlled Lungs: distant breath sounds and L upper inspiratory wheezes. Abdomen: soft, nontender, nondistended. No hepatosplenomegaly. No bruits or masses. Good bowel sounds. Extremities: no cyanosis, clubbing, rash, edema Neuro: alert & orientedx3, cranial nerves grossly intact. Moves all 4 extremities w/o difficulty. Affect pleasant.   ASSESSMENT & PLAN:  1) Chronic systolic HF: ICM s/p ICD, EF 20-25%, grade II DD (07/2013) - Recently discharged from the hospital for A/C respiratory failure and A/C HF. Diuresed and Toprol added for rate control for Afib. D/C weight 187 lbs. - NYHA II-III symptoms and volume status stable. Will continue torsemide 20 mg BID. Check BMET today.  - Continue Toprol 25 mg daily. - He is not sure if he has been taking losartan currently or not. Will ask him to hold until we get labs and asked him to call back and let [name] know if he has been taking or not. If he has been taking and K+ ok then will continue, however if not taking will not restart with soft BP. - Reinforced the need and importance of daily weights, a low sodium diet, and fluid restriction (less than 2 L a day). Instructed to call the HF clinic if weight increases more than 3 lbs overnight or 5 lbs in a week.  2) COPD - I think this is the biggest issue with his breathing. Continue to follow up with pulmonologist. He did have small bilateral pleural effusions on CT R>L but to small for tap. He also had 1.5 x 1.2 cm nodule on LUL and is to follow up with OP pulmonologist. Sent dc summary to Dr. [name]. 3) Atrial Fibrillation - Rate controlled  on Toprol. Continue Xarelto 20 mg daily. No issues with bleeding.  4) Tobacco abuse: - Reports he has only had 1 cigarette since being discharged from the hospital and is quitting. Congratulated patient on success and encouraged him to continue to abstain. 5) CAD - s/p multiple stents. Denies any s/s of ischemia. After reviewing in chart patient not on statin. Will need to start atorvastatin 40 mg daily.   F/U 4-6 weeks.  Ulla Potashosgrove, Brynn Mulgrew B NP-C 10:06 AM

## 2014-06-08 NOTE — Patient Instructions (Signed)
Doing well.  Call us back at [phone] to let us know if you have been taking your losartan or not.   Follow up in 4-6 weeks.  Call any issues.  Do the following things EVERYDAY: 1) Weigh yourself in the morning before breakfast. Write it down and keep it in a log. 2) Take your medicines as prescribed 3) Eat low salt foods-Limit salt (sodium) to 2000 mg per day.  4) Stay as active as you can everyday 5) Limit all fluids for the day to less than 2 liters 6)

## 2014-06-19 ENCOUNTER — Ambulatory Visit (INDEPENDENT_AMBULATORY_CARE_PROVIDER_SITE_OTHER): Payer: Medicare Other | Admitting: *Deleted

## 2014-06-19 ENCOUNTER — Encounter: Payer: Self-pay | Admitting: Internal Medicine

## 2014-06-19 DIAGNOSIS — I4891 Unspecified atrial fibrillation: Secondary | ICD-10-CM

## 2014-06-19 DIAGNOSIS — I5043 Acute on chronic combined systolic (congestive) and diastolic (congestive) heart failure: Secondary | ICD-10-CM

## 2014-06-19 LAB — MDC_IDC_ENUM_SESS_TYPE_INCLINIC
Brady Statistic RA Percent Paced: 0 %
Brady Statistic RV Percent Paced: 6.1 %
HIGH POWER IMPEDANCE MEASURED VALUE: 72 Ohm
Implantable Pulse Generator Serial Number: 454266
Lead Channel Impedance Value: 350 Ohm
Lead Channel Impedance Value: 387.5 Ohm
Lead Channel Pacing Threshold Amplitude: 1.5 V
Lead Channel Pacing Threshold Pulse Width: 1 ms
Lead Channel Sensing Intrinsic Amplitude: 11.5 mV
Lead Channel Sensing Intrinsic Amplitude: 3.1 mV
Lead Channel Setting Pacing Amplitude: 2 V
Lead Channel Setting Pacing Pulse Width: 1 ms
Lead Channel Setting Sensing Sensitivity: 0.3 mV
MDC IDC MSMT BATTERY REMAINING LONGEVITY: 0 mo
MDC IDC MSMT BATTERY VOLTAGE: 2.42 V
MDC IDC MSMT LEADCHNL RV PACING THRESHOLD AMPLITUDE: 1.5 V
MDC IDC MSMT LEADCHNL RV PACING THRESHOLD PULSEWIDTH: 1 ms
MDC IDC SESS DTM: 20151123155706
MDC IDC SET LEADCHNL RV PACING AMPLITUDE: 2.5 V
MDC IDC SET ZONE DETECTION INTERVAL: 280 ms

## 2014-06-19 NOTE — Progress Notes (Signed)
ICD check in clinic. Normal device function. Thresholds and sensing consistent with previous device measurements. Impedance trends stable over time. No evidence of any ventricular arrhythmias. A-fib, + eliquis. Histogram distribution appropriate for patient and level of activity. No changes made this session. Device programmed at appropriate safety margins. Device programmed to optimize intrinsic conduction. Device @ [facility] 06/13/14.  Alert tones/vibration off.  ROV 11/25 with Dr. [name] to discuss change out.

## 2014-06-21 ENCOUNTER — Telehealth: Payer: Self-pay | Admitting: Internal Medicine

## 2014-06-21 ENCOUNTER — Encounter: Payer: Self-pay | Admitting: *Deleted

## 2014-06-21 ENCOUNTER — Encounter: Payer: Self-pay | Admitting: Internal Medicine

## 2014-06-21 ENCOUNTER — Ambulatory Visit (INDEPENDENT_AMBULATORY_CARE_PROVIDER_SITE_OTHER): Payer: Medicare Other | Admitting: Internal Medicine

## 2014-06-21 VITALS — BP 86/56 | HR 91 | Ht 67.0 in | Wt 186.4 lb

## 2014-06-21 DIAGNOSIS — Z95810 Presence of automatic (implantable) cardiac defibrillator: Secondary | ICD-10-CM

## 2014-06-21 DIAGNOSIS — Z720 Tobacco use: Secondary | ICD-10-CM

## 2014-06-21 DIAGNOSIS — I255 Ischemic cardiomyopathy: Secondary | ICD-10-CM

## 2014-06-21 DIAGNOSIS — I482 Chronic atrial fibrillation, unspecified: Secondary | ICD-10-CM

## 2014-06-21 DIAGNOSIS — I5023 Acute on chronic systolic (congestive) heart failure: Secondary | ICD-10-CM

## 2014-06-21 DIAGNOSIS — I2589 Other forms of chronic ischemic heart disease: Secondary | ICD-10-CM

## 2014-06-21 NOTE — Progress Notes (Signed)
HPI [name] returns today for followup. He is a very pleasant 63 year old man with chronic atrial fibrillation and an ischemic cardiomyopathy. He has chronically had severe LV dysfunction with an EF of 20%.  He denies chest pain, syncope, or ICD shock.  He was hospitalized approximately 3 weeks ago with worsening CHF. He has reached [facility] on his ICD. He notes that he has lost 35 lbs in the past year.                                               No Known Allergies   Current Outpatient Prescriptions  Medication Sig Dispense Refill  . atorvastatin (LIPITOR) 40 MG tablet Take 1 tablet (40 mg total) by mouth daily. 30 tablet 3  . B Complex-C (B-COMPLEX WITH VITAMIN C) tablet Take 1 tablet by mouth daily.    . budesonide-formoterol (SYMBICORT) 160-4.5 MCG/ACT inhaler Inhale 2 puffs into the lungs 2 (two) times daily.    . colchicine 0.6 MG tablet Take 0.6 mg by mouth 2 (two) times daily as needed.    . digoxin (LANOXIN) 0.125 MG tablet Take 1 tablet (0.125 mg total) by mouth daily. 90 tablet 3  . gabapentin (NEURONTIN) 300 MG capsule Take 300 mg by mouth at bedtime.    . levalbuterol (XOPENEX HFA) 45 MCG/ACT inhaler Inhale 1-2 puffs into the lungs every 6 (six) hours as needed for wheezing. 1 Inhaler 12  . losartan (COZAAR) 50 MG tablet Take 1 tablet (50 mg total) by mouth daily. 90 tablet 3  . metoprolol succinate (TOPROL-XL) 25 MG 24 hr tablet Take 1 tablet (25 mg total) by mouth daily. 30 tablet 0  . nitroGLYCERIN (NITROSTAT) 0.4 MG SL tablet Place 1 tablet (0.4 mg total) under the tongue every 5 (five) minutes as needed for chest pain. 25 tablet 2  . omeprazole (PRILOSEC) 20 MG capsule Take 20 mg by mouth daily.    . rivaroxaban (XARELTO) 20 MG TABS tablet Take 1 tablet (20 mg total) by mouth daily. 30 tablet 6  . torsemide (DEMADEX) 20 MG tablet Take 1 tablet (20 mg total) by mouth 2 (two) times daily. 120 tablet 2   No current facility-administered medications for this visit.     Past  Medical History  Diagnosis Date  . Ischemic cardiomyopathy     cardiomyopathy ischemic  . Paroxysmal atrial fibrillation   . Renal calculi   . 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 Chronic systolic dysfunction of left ventricle   . Coronary artery disease   . Chronic pain     chronic left abdominal pain  . ICD (implantable cardioverter-defibrillator), single, in situ     ROS:   All systems reviewed and negative except as noted in the HPI.   Past Surgical History  Procedure Laterality Date  . Difibrillator insertion  12/31/06    [facility] Current DR RF ICD implanted by Dr Graciela HusbandsKlein     Family History  Problem Relation Age of Onset  . Coronary artery disease Other     family history of CAD  . Cancer Mother      History   Social History  . Marital Status: Divorced    Spouse Name: N/A    Number of Children: N/A  . Years of Education: N/A   Occupational History  . RETIRED  Social History Main Topics  . Smoking status: Current Some Day Smoker -- 0.50 packs/day for 40 years    Types: Cigarettes  . Smokeless tobacco: Current User     Comment: trying to quit; smoked 1 cigarette since 06/02/14  . Alcohol Use: No  . Drug Use: No  . Sexual Activity: No   Other Topics Concern  . Not on file   Social History Narrative   DISABLED ,DIVORCED     BP 86/56 mmHg  Pulse 91  Ht 5' 7" (1.702 m)  Wt 186 lb 6.4 oz (84.55 kg)  BMI 29.19 kg/m2  Physical Exam:  stable appearing middle-aged man, NAD HEENT: Unremarkable Neck:  6 cm JVD, no thyromegally Back:  No CVA tenderness Lungs:  Clear with no wheezes or rhonchi, scattered rales are present. HEART:  IRegular rate rhythm, no murmurs, no rubs, no clicks Abd:  soft, positive bowel sounds, no organomegally, no rebound, no guarding Ext:  2 plus pulses, no edema, no cyanosis, no clubbing Skin:  No rashes no nodules Neuro:  CN II through XII intact, motor grossly intact   DEVICE  Normal device  function.  See PaceArt for details. He is at [facility].  Assess/Plan: 

## 2014-06-21 NOTE — Assessment & Plan Note (Signed)
He is encouraged to stop smoking. 

## 2014-06-21 NOTE — Assessment & Plan Note (Signed)
His ventricular rate is well controlled. He will continue a strategy of rate control and systemic anticoagulation.

## 2014-06-21 NOTE — Assessment & Plan Note (Signed)
His [employer] ICD is [facility] [employer]. Will schedule ICD generator change in the next few weeks.

## 2014-06-21 NOTE — Assessment & Plan Note (Signed)
He denies anginal symptoms. He will continue his current anti-anginal symptoms.  

## 2014-06-21 NOTE — Assessment & Plan Note (Signed)
His congestive heart failure remains class IIb. His ejection fraction is 20%, long-standing. He is on maximal medical therapy. He is trying to reduce his sodium intake.

## 2014-06-21 NOTE — Patient Instructions (Signed)
Your physician has recommended that you have a defibrillator generator (battery) change.  Please see the instruction sheet given to you today for more information.  Your physician recommends that you continue on your current medications as directed. Please refer to the Current Medication list given to you today.    

## 2014-06-26 ENCOUNTER — Other Ambulatory Visit: Payer: Self-pay | Admitting: *Deleted

## 2014-06-26 ENCOUNTER — Other Ambulatory Visit (INDEPENDENT_AMBULATORY_CARE_PROVIDER_SITE_OTHER): Payer: Medicare Other | Admitting: *Deleted

## 2014-06-26 DIAGNOSIS — I255 Ischemic cardiomyopathy: Secondary | ICD-10-CM

## 2014-06-26 DIAGNOSIS — Z9289 Personal history of other medical treatment: Secondary | ICD-10-CM

## 2014-06-26 LAB — CBC WITH DIFFERENTIAL/PLATELET
BASOS ABS: 0 10*3/uL (ref 0.0–0.1)
BASOS PCT: 0.5 % (ref 0.0–3.0)
EOS ABS: 0.5 10*3/uL (ref 0.0–0.7)
Eosinophils Relative: 4.8 % (ref 0.0–5.0)
HCT: 45.5 % (ref 39.0–52.0)
Hemoglobin: 15.1 g/dL (ref 13.0–17.0)
Lymphocytes Relative: 14.6 % (ref 12.0–46.0)
Lymphs Abs: 1.4 10*3/uL (ref 0.7–4.0)
MCHC: 33.2 g/dL (ref 30.0–36.0)
MCV: 95.9 fl (ref 78.0–100.0)
MONO ABS: 0.7 10*3/uL (ref 0.1–1.0)
Monocytes Relative: 7.1 % (ref 3.0–12.0)
NEUTROS PCT: 73 % (ref 43.0–77.0)
Neutro Abs: 7.1 10*3/uL (ref 1.4–7.7)
Platelets: 302 10*3/uL (ref 150.0–400.0)
RBC: 4.75 Mil/uL (ref 4.22–5.81)
RDW: 14.2 % (ref 11.5–15.5)
WBC: 9.7 10*3/uL (ref 4.0–10.5)

## 2014-06-26 LAB — BASIC METABOLIC PANEL
BUN: 15 mg/dL (ref 6–23)
CHLORIDE: 97 meq/L (ref 96–112)
CO2: 30 meq/L (ref 19–32)
CREATININE: 1 mg/dL (ref 0.4–1.5)
Calcium: 8.8 mg/dL (ref 8.4–10.5)
GFR: 81.64 mL/min (ref >60.00)
GLUCOSE: 122 mg/dL — AB (ref 70–99)
Potassium: 3.6 mEq/L (ref 3.5–5.1)
Sodium: 136 mEq/L (ref 135–145)

## 2014-06-27 DIAGNOSIS — Z95810 Presence of automatic (implantable) cardiac defibrillator: Secondary | ICD-10-CM

## 2014-06-27 HISTORY — DX: Presence of automatic (implantable) cardiac defibrillator: Z95.810

## 2014-06-28 ENCOUNTER — Encounter: Payer: Self-pay | Admitting: Internal Medicine

## 2014-06-28 ENCOUNTER — Ambulatory Visit (INDEPENDENT_AMBULATORY_CARE_PROVIDER_SITE_OTHER): Payer: Medicare Other | Admitting: Internal Medicine

## 2014-06-28 ENCOUNTER — Other Ambulatory Visit (INDEPENDENT_AMBULATORY_CARE_PROVIDER_SITE_OTHER): Payer: Medicare Other

## 2014-06-28 ENCOUNTER — Ambulatory Visit (INDEPENDENT_AMBULATORY_CARE_PROVIDER_SITE_OTHER)
Admission: RE | Admit: 2014-06-28 | Discharge: 2014-06-28 | Disposition: A | Discharge status: Home / Self Care | Payer: Medicare Other | Source: Ambulatory Visit | Attending: Internal Medicine | Admitting: Internal Medicine

## 2014-06-28 VITALS — BP 90/60 | HR 106 | Ht 67.0 in | Wt 187.0 lb

## 2014-06-28 DIAGNOSIS — I10 Essential (primary) hypertension: Secondary | ICD-10-CM

## 2014-06-28 DIAGNOSIS — R0600 Dyspnea, unspecified: Secondary | ICD-10-CM

## 2014-06-28 DIAGNOSIS — F17210 Nicotine dependence, cigarettes, uncomplicated: Secondary | ICD-10-CM

## 2014-06-28 DIAGNOSIS — J449 Chronic obstructive pulmonary disease, unspecified: Secondary | ICD-10-CM

## 2014-06-28 DIAGNOSIS — R911 Solitary pulmonary nodule: Secondary | ICD-10-CM

## 2014-06-28 DIAGNOSIS — E785 Hyperlipidemia, unspecified: Secondary | ICD-10-CM

## 2014-06-28 DIAGNOSIS — J90 Pleural effusion, not elsewhere classified: Secondary | ICD-10-CM

## 2014-06-28 LAB — BRAIN NATRIURETIC PEPTIDE: PRO B NATRI PEPTIDE: 392 pg/mL — AB (ref 0.0–100.0)

## 2014-06-28 LAB — TSH: TSH: 3.82 u[IU]/mL (ref 0.35–4.50)

## 2014-06-28 LAB — SEDIMENTATION RATE: Sed Rate: 43 mm/hr — ABNORMAL HIGH (ref 0–22)

## 2014-06-28 NOTE — Progress Notes (Signed)
Subjective:     Patient ID: [name], male   DOB: April 03, 1951  MRN: [id number]  HPI  [age] yowm active smoker with doe x walking with cart flat and slow to do groceries x 2011 much worse [facility]:   Admit date: 05/24/2014 Discharge date: 06/02/2014   Recommendations for Outpatient Follow-up:  1. F/u on pleural effusion  2. F/u pulmonary nodule for biopsy 3. Resume Losartan when able- f/u on K+ level     Discharge Diagnoses:   Acute on chronic combined systolic and diastolic CHF (congestive heart failure)   pneumothorax  Decompensated COPD with exacerbation (chronic obstructive pulmonary disease)  Atrial fibrillation with rapid ventricular response  Pleural effusion  AICD (automatic cardioverter/defibrillator) present    History of present illness:  63 year old male WM PMHx CAD s/p several stents, CHF (LCEF 20-25% 07/2013), COPD ( On Hm O2 @ 2L followed by Dr. [name], PFTs suggestive of restrictive disease), and Afib refractory to drugs and cardioversion, on rivaroxaban who had been feeling ill for several months prior to admission. About one month prior he began having chest pain, sob, and peripheral edema. He had been severely limited at home, unable to perform ADLs without becoming profoundly dyspneic. The morning of 10/28 he called his sister to take him to ED, however he was so dyspneic she had to call EMS. In ED he was SOB and hypotensive with chest and upper back pain. He was sent for CTA to r/o aortic dissection. CT showed large bilateral effusions R>L with small R PTX. PCCM   called for admission.  Hospital Course:  Bilateral pleural effusions R>L w/ small R PTX - likely 2nd to decompensated CHF - thoracentesis not performed due to small volume at that time -continues to have a moderate size effusion on the right-suspecting this may be secondary to heart failure (has been present since 1/15) however despite aggressive diuresis, it has not  resolved -as oxygen level is stable on room air and his is no longer symptomatic- no urgent need for thoracentesis especially due to the fact that this is a chronic effusion  Acute on chronic systolic and diastolic CHF - ischemic CM -LVEF 20-25% - echo 1/15 - diuresis limited by orthostatic hypotension - Cardiology following and titrating meds - now in negative balance by 8700 cc and decrease in weight from 88.9 kg to 85.0 kg  Orthostatic hypotension -Diuretics held and 500 mL bolus given by CHF team - diuretics resumed by CHF team today  Hyperkalemia - K slightly elevated at 5.4 today- has been running high normal during his hospital stay- hold ARB for now  COPD with acute exacerbation - ongoing tobacco use - acute bronchitis vs pneumonia --advised that he must quite smoking completely -tapering steroids now -Xopenex prescribed- cont Symbicort -was given 1 day of Zithromax and Rocephin on 10/28 and this was subsequently discontinued - began developing leukocytosis and recurrent cough- have started Levaquin - will complete a 5 day course  S/P AICD  -Followed by Dr. [name]   LUL Lung nodule on CT chest  -Awaiting comment from Pulmonary - 1.5 x 1.2cm pleural based - increased in size since Sept 2012 w/ appearance concerning for lung CA -pulmonology team recommendations outpt follow up with primary pulmonologist - I have discussed this plan with the patient and he is in agreement with it  Parox Atrial fibrillation with acute RVR -continue rivaroxaban - failed cardioversion several times - rate slightly uncontrolled - Digoxin loaded per Cards  this admission -Toprol increased by cardiology today to 25 mg - advised to d/c home with current increase in dose  Hypotension  - BP stable on current meds- no longer orthostatic  H/o CAD -troponin negative x 2 - denies cp    Procedures: 1/25 echocardiogram;- Left ventricle: mildly dilated. LVEF= 20% to 25%. dyskinesis of  the mid-distalinferoseptal myocardium. akinesis of apical myocardium. hypokinesis of basal-midinferolateral and inferior myocardium. Akinesis of the distalinferior myocardium.  -(grade 2 diastolicdysfunction). - Ventricular septum: Septal motion showed abnormal function and dyssynergy. -Left atrium: moderately dilated. - Right ventricle: Pacer wire or catheter noted in rightventricle. - Right atrium: Central venous pressure: 8mm Hg (est). 10/28 CTA chest > Bilateral effusion, R>L. Small R PTX. LUL nodule.       06/28/2014 1st [facility] office visit/ Anara Cowman  / still actively smoking Chief Complaint  Patient presents with  . Pulmonary Consult    Referred by Dr. Sherryll Burger for eval of pulmonary nodule. Pt states he is here for eval of COPD- was dxed "a while back".  He c/o SOB with walking short distances such as from room to room at home. He also c/o cough and wheezing. Cough is occ prod with minimal yellow sputum.   doe x 4 y much worse since last admit room to room and has home 02 uses most of time x 3 year Using 02 2lpm 22/24h per day and still smoking some  Cough is a little productive than usual with slt yellow mucus no blood Can't lie on back due to pain, sleeps sitting in recliner x 3-4 years  No obvious day to day or daytime variabilty or assoc chronic cough or cp or chest tightness, subjective wheeze overt sinus or hb symptoms. No unusual exp hx or h/o childhood pna/ asthma or knowledge of premature birth.  Sleeping ok without nocturnal  or early am exacerbation  of respiratory  c/o's or need for noct saba. Also denies any obvious fluctuation of symptoms with weather or environmental changes or other aggravating or alleviating factors except as outlined above   Current Medications, Allergies, Complete Past Medical History, Past Surgical History, Family History, and Social History were reviewed in [employer] record.  ROS  The following are not  active complaints unless bolded sore throat, dysphagia, dental problems, itching, sneezing,  nasal congestion or excess/ purulent secretions, ear ache,   fever, chills, sweats, unintended wt loss, pleuritic or exertional cp, hemoptysis,  orthopnea pnd or leg swelling, presyncope, palpitations, heartburn, abdominal pain, anorexia, nausea, vomiting, diarrhea  or change in bowel or urinary habits, change in stools or urine, dysuria,hematuria,  rash, arthralgias, visual complaints, headache, numbness weakness or ataxia or problems with walking or coordination,  change in mood/affect or memory.          Review of Systems     Objective:   Physical Exam    amb wm nad   Wt Readings from Last 3 Encounters:  06/28/14 187 lb (84.823 kg)  06/21/14 186 lb 6.4 oz (84.55 kg)  06/08/14 183 lb 4 oz (83.122 kg)    Vital signs reviewed  HEENT: nl  , turbinates, and orophanx. Nl external ear canals without cough reflex Full denture top/partial botton   NECK :  without JVD/Nodes/TM/ nl carotid upstrokes bilaterally   LUNGS: no acc muscle use, insp and exp rhonchi decreased bs R with dullness   CV:  IRIR  no s3 or murmur or increase in P2, no edema   ABD:  soft and nontender with nl excursion in the supine position. No bruits or organomegaly, bowel sounds nl  MS:  warm without deformities, calf tenderness, cyanosis or clubbing  SKIN: warm and dry without lesions    NEURO:  alert, approp, no deficits    CXR  06/28/2014 : Slight increase in the right-sided pleural effusion consistent with low-grade CHF superimposed upon COPD.        Recent Labs Lab 06/26/14 1308  NA 136  K 3.6  CL 97  CO2 30  BUN 15  CREATININE 1.0  GLUCOSE 122*    Recent Labs Lab 06/26/14 1308  HGB 15.1  HCT 45.5  WBC 9.7  PLT 302.0     Lab Results  Component Value Date   TSH 3.82 06/28/2014     Lab Results  Component Value Date   PROBNP 392.0* 06/28/2014     Lab Results  Component Value Date    ESRSEDRATE 43* 06/28/2014        Assessment:

## 2014-06-28 NOTE — Patient Instructions (Addendum)
Work on inhaler technique:  relax and gently blow all the way out then take a nice smooth deep breath back in, triggering the inhaler at same time you start breathing in.  Hold for up to 5 seconds if you can.  Rinse and gargle with water when done  symbicort 160 Take 2 puffs first thing in am and then another 2 puffs about 12 hours later.   Only use your xopenex as a rescue medication to be used if you can't catch your breath by resting or doing a relaxed purse lip breathing pattern.  - The less you use it, the better it will work when you need it. - Ok to use up to 2 puffs  every 4 hours if you must but call for immediate appointment if use goes up over your usual need - Don't leave home without it !!  (think of it like the spare tire for your car)   Please remember to go to the lab and x-ray department downstairs for your tests - we will call you with the results when they are available.    Please schedule a follow up office visit in 2  weeks, sooner if needed  Late add : consider ir bx LUL p holidays

## 2014-06-29 ENCOUNTER — Encounter (HOSPITAL_COMMUNITY): Payer: Self-pay | Admitting: Pharmacy Technician

## 2014-06-29 NOTE — Progress Notes (Signed)
Quick Note:  Spoke with pt and notified of results per Dr. Wert. Pt verbalized understanding and denied any questions.  ______ 

## 2014-07-01 DIAGNOSIS — J449 Chronic obstructive pulmonary disease, unspecified: Secondary | ICD-10-CM | POA: Insufficient documentation

## 2014-07-01 NOTE — Assessment & Plan Note (Signed)
-   06/28/2014   Walked RA x one lap @ 185 stopped due to end of study, slow pace, unstead gait/ no desat   Clearly multifactorial and only a small component is directly related to lungs; note, however, that effusions and copd have same effect on insp muscle mechanics

## 2014-07-01 NOTE — Assessment & Plan Note (Signed)
Almost certainly related to chf with R > L chronic effusions and secondary atx.  Since he would need to be off anticoagulation to tap it prefer for now to f/u and work on other components contributing to his sob

## 2014-07-01 NOTE — Assessment & Plan Note (Signed)
See ct chest  04/14/2011 report only first describing LUL x 0.5 cm - CT 05/24/14 1.5 by 1.2 cm pleural-based left upper lobe nodule  So this probably represents a slow growing peripheral nodule in a pt who would be high risk resection but since pleural based could probably consider IR bx and RT once get him breathing better

## 2014-07-01 NOTE — Assessment & Plan Note (Signed)

## 2014-07-01 NOTE — Assessment & Plan Note (Signed)
-   pfts 10/26/13  FEV1  1.67 p saba which was 13% better and resulted in 53% pred and ratio of 69/ barely meeting criteria for copd/ dlco 58%   Although the amt of airflow obst in only mild, it is an easily correctable component of his problem  The proper method of use, as well as anticipated side effects, of a metered-dose inhaler are discussed and demonstrated to the patient. Improved effectiveness after extensive coaching during this visit to a level of approximately  75% so rx with symbicort 160 2bid   Smoking cessation key here (sep a/p)

## 2014-07-02 DIAGNOSIS — I481 Persistent atrial fibrillation: Secondary | ICD-10-CM | POA: Diagnosis not present

## 2014-07-02 DIAGNOSIS — I480 Paroxysmal atrial fibrillation: Secondary | ICD-10-CM | POA: Diagnosis not present

## 2014-07-02 DIAGNOSIS — I2510 Atherosclerotic heart disease of native coronary artery without angina pectoris: Secondary | ICD-10-CM | POA: Diagnosis not present

## 2014-07-02 DIAGNOSIS — Z95810 Presence of automatic (implantable) cardiac defibrillator: Secondary | ICD-10-CM | POA: Diagnosis not present

## 2014-07-02 DIAGNOSIS — I482 Chronic atrial fibrillation: Secondary | ICD-10-CM | POA: Diagnosis not present

## 2014-07-02 DIAGNOSIS — F17210 Nicotine dependence, cigarettes, uncomplicated: Secondary | ICD-10-CM | POA: Diagnosis not present

## 2014-07-02 DIAGNOSIS — I509 Heart failure, unspecified: Secondary | ICD-10-CM | POA: Diagnosis not present

## 2014-07-02 DIAGNOSIS — I255 Ischemic cardiomyopathy: Secondary | ICD-10-CM | POA: Diagnosis not present

## 2014-07-02 DIAGNOSIS — I252 Old myocardial infarction: Secondary | ICD-10-CM | POA: Diagnosis not present

## 2014-07-02 DIAGNOSIS — Z4502 Encounter for adjustment and management of automatic implantable cardiac defibrillator: Secondary | ICD-10-CM | POA: Diagnosis not present

## 2014-07-02 MED ORDER — CEFAZOLIN SODIUM-DEXTROSE 2-3 GM-% IV SOLR: 2.0000 g | INTRAVENOUS | Status: DC

## 2014-07-02 MED ORDER — SODIUM CHLORIDE 0.9 % IR SOLN
80.0000 mg | Status: DC
  Filled 2014-07-02: qty 2

## 2014-07-03 ENCOUNTER — Encounter (HOSPITAL_COMMUNITY)
Admission: RE | Disposition: A | Discharge status: Home / Self Care | Payer: Self-pay | Source: Ambulatory Visit | Attending: Internal Medicine

## 2014-07-03 ENCOUNTER — Ambulatory Visit (HOSPITAL_COMMUNITY)
Admission: RE | Admit: 2014-07-03 | Discharge: 2014-07-03 | Disposition: A | Discharge status: Home / Self Care | Payer: Medicare Other | Source: Ambulatory Visit | Attending: Internal Medicine | Admitting: Internal Medicine

## 2014-07-03 DIAGNOSIS — I255 Ischemic cardiomyopathy: Secondary | ICD-10-CM

## 2014-07-03 DIAGNOSIS — Z95810 Presence of automatic (implantable) cardiac defibrillator: Secondary | ICD-10-CM | POA: Insufficient documentation

## 2014-07-03 DIAGNOSIS — I480 Paroxysmal atrial fibrillation: Secondary | ICD-10-CM | POA: Insufficient documentation

## 2014-07-03 DIAGNOSIS — Z4502 Encounter for adjustment and management of automatic implantable cardiac defibrillator: Secondary | ICD-10-CM

## 2014-07-03 DIAGNOSIS — I482 Chronic atrial fibrillation: Secondary | ICD-10-CM | POA: Diagnosis not present

## 2014-07-03 DIAGNOSIS — I509 Heart failure, unspecified: Secondary | ICD-10-CM | POA: Insufficient documentation

## 2014-07-03 DIAGNOSIS — I252 Old myocardial infarction: Secondary | ICD-10-CM | POA: Insufficient documentation

## 2014-07-03 DIAGNOSIS — I2510 Atherosclerotic heart disease of native coronary artery without angina pectoris: Secondary | ICD-10-CM | POA: Insufficient documentation

## 2014-07-03 DIAGNOSIS — I481 Persistent atrial fibrillation: Secondary | ICD-10-CM | POA: Diagnosis not present

## 2014-07-03 DIAGNOSIS — F17210 Nicotine dependence, cigarettes, uncomplicated: Secondary | ICD-10-CM | POA: Insufficient documentation

## 2014-07-03 HISTORY — PX: IMPLANTABLE CARDIOVERTER DEFIBRILLATOR (ICD) GENERATOR CHANGE: SHX5469

## 2014-07-03 LAB — SURGICAL PCR SCREEN
MRSA, PCR: NEGATIVE
Staphylococcus aureus: NEGATIVE

## 2014-07-03 SURGERY — ICD GENERATOR CHANGE
Anesthesia: LOCAL

## 2014-07-03 MED ORDER — CHLORHEXIDINE GLUCONATE 4 % EX LIQD
60.0000 mL | Freq: Once | CUTANEOUS | Status: DC
  Filled 2014-07-03: qty 60

## 2014-07-03 MED ORDER — LIDOCAINE HCL (PF) 1 % IJ SOLN
INTRAMUSCULAR | Status: AC
  Filled 2014-07-03: qty 60

## 2014-07-03 MED ORDER — ACETAMINOPHEN 325 MG PO TABS
325.0000 mg | ORAL_TABLET | ORAL | Status: DC | PRN
  Filled 2014-07-03: qty 2

## 2014-07-03 MED ORDER — SODIUM CHLORIDE 0.9 % IV SOLN
INTRAVENOUS | Status: DC
  Administered 2014-07-03: 10:00:00 via INTRAVENOUS

## 2014-07-03 MED ORDER — ONDANSETRON HCL 4 MG/2ML IJ SOLN: 4.0000 mg | Freq: Four times a day (QID) | INTRAMUSCULAR | Status: DC | PRN

## 2014-07-03 MED ORDER — FENTANYL CITRATE 0.05 MG/ML IJ SOLN
INTRAMUSCULAR | Status: AC
  Filled 2014-07-03: qty 2

## 2014-07-03 MED ORDER — METOPROLOL TARTRATE 1 MG/ML IV SOLN
INTRAVENOUS | Status: AC
  Filled 2014-07-03: qty 5

## 2014-07-03 MED ORDER — MUPIROCIN 2 % EX OINT
TOPICAL_OINTMENT | Freq: Once | CUTANEOUS | Status: AC
  Administered 2014-07-03: 10:00:00 via NASAL
  Filled 2014-07-03: qty 22

## 2014-07-03 MED ORDER — MIDAZOLAM HCL 5 MG/5ML IJ SOLN
INTRAMUSCULAR | Status: AC
  Filled 2014-07-03: qty 5

## 2014-07-03 MED ORDER — MUPIROCIN 2 % EX OINT
TOPICAL_OINTMENT | CUTANEOUS | Status: AC
  Filled 2014-07-03: qty 22

## 2014-07-03 NOTE — Interval H&P Note (Signed)
History and Physical Interval Note:  07/03/2014 9:24 AM  [name]  has presented today for surgery, with the diagnosis of cm/eri  The various methods of treatment have been discussed with the patient and family. After consideration of risks, benefits and other options for treatment, the patient has consented to  Procedure(s): ICD GENERATOR CHANGE (N/A) as a surgical intervention .  The patient's history has been reviewed, patient examined, no change in status, stable for surgery.  I have reviewed the patient's chart and labs.  Questions were answered to the patient's satisfaction.     [name],M.D.

## 2014-07-03 NOTE — CV Procedure (Signed)
EP Procedure Note  Procedure: ICD removal and insertion of a new ICD with ICD pocket revision  Preprocedure Diagnosis: Longstanding ischemic cardiomyopathy, EF 20%, class 2 CHF, atrial fib prior ICD shock for VT, and [facility]  Postprocedure diagnosis: same as pre-procedure  Description of the procedure: After informed consent was obtained, the patient was taken to the diagnostic electrophysiology laboratory in the fasting state. After the usual preparation and draping, intravenous Versed and fentanyl were used for sedation. 30 cc of lidocaine was infiltrated into the right infraclavicular region. A 6 cm incision was carried out. Electrocautery was utilized to dissect down to the ICD pocket. As the patient has lost weight, his device had migrated under his armpit. There is very dense fibrous adhesions present. Extensive electrocautery was utilized to free up the ICD and ICD leads. Electrocautery was utilized to revise the pocket. The pocket was moved superiorly and medially. The pocket was irrigated with antibiotic irrigation. The old St. Jude ICD was removed, and the new [employer]. Jude ICD, serial [id number] was connected to the old ICD lead and placed in the new subcutaneous pocket. The pocket was irrigated with antibiotic irrigation. Electrocautery was utilized to assure hemostasis. The incision was closed with 2 layers of Vicryl suture. Benzoin and Steri-Strips her pain on the skin. The patient was returned to his room in satisfactory condition.  Complications: There were no immediate procedure complications  Conclusion: Successful removal of a previous implanted ICD which had reached elective replacement, insertion of a new ICD, with relocation of the ICD pocket.  Lewayne BuntingGregg Taylor, M.D.

## 2014-07-03 NOTE — H&P (Signed)
  ICD Criteria  Current LVEF:20% ;Obtained > 6 months ago.   NYHA Functional Classification: Class II  Heart Failure History:  Yes, Duration of heart failure since onset is > 9 months  Non-Ischemic Dilated Cardiomyopathy History:  No.  Atrial Fibrillation/Atrial Flutter:  Yes, A-Fib/A-Flutter type: Persistent (>7 days).  Ventricular Tachycardia History:  No.  Cardiac Arrest History:  No  History of Syndromes with Risk of Sudden Death:  No.  Previous ICD:  Yes, ICD Type:  Dual, Reason for ICD:  Primary prevention.  20%  Electrophysiology Study: No.  Prior MI: Yes, Most recent MI timeframe is > 40 days.  PPM: No.  OSA:  No  Patient Life Expectancy of >=1 year: Yes.  Anticoagulation Therapy:  Patient is on anticoagulation therapy, anticoagulation was held prior to procedure.   Beta Blocker Therapy:  Yes.   Ace Inhibitor/ARB Therapy:  Yes.

## 2014-07-03 NOTE — Progress Notes (Signed)
Quick Note:  LMTCB ______ 

## 2014-07-03 NOTE — H&P (View-Only) (Signed)
HPI [name] returns today for followup. He is a very pleasant 63 year old man with chronic atrial fibrillation and an ischemic cardiomyopathy. He has chronically had severe LV dysfunction with an EF of 20%.  He denies chest pain, syncope, or ICD shock.  He was hospitalized approximately 3 weeks ago with worsening CHF. He has reached ERI on his ICD. He notes that he has lost 35 lbs in the past year.                                               No Known Allergies   Current Outpatient Prescriptions  Medication Sig Dispense Refill  . atorvastatin (LIPITOR) 40 MG tablet Take 1 tablet (40 mg total) by mouth daily. 30 tablet 3  . B Complex-C (B-COMPLEX WITH VITAMIN C) tablet Take 1 tablet by mouth daily.    . budesonide-formoterol (SYMBICORT) 160-4.5 MCG/ACT inhaler Inhale 2 puffs into the lungs 2 (two) times daily.    . colchicine 0.6 MG tablet Take 0.6 mg by mouth 2 (two) times daily as needed.    . digoxin (LANOXIN) 0.125 MG tablet Take 1 tablet (0.125 mg total) by mouth daily. 90 tablet 3  . gabapentin (NEURONTIN) 300 MG capsule Take 300 mg by mouth at bedtime.    . levalbuterol (XOPENEX HFA) 45 MCG/ACT inhaler Inhale 1-2 puffs into the lungs every 6 (six) hours as needed for wheezing. 1 Inhaler 12  . losartan (COZAAR) 50 MG tablet Take 1 tablet (50 mg total) by mouth daily. 90 tablet 3  . metoprolol succinate (TOPROL-XL) 25 MG 24 hr tablet Take 1 tablet (25 mg total) by mouth daily. 30 tablet 0  . nitroGLYCERIN (NITROSTAT) 0.4 MG SL tablet Place 1 tablet (0.4 mg total) under the tongue every 5 (five) minutes as needed for chest pain. 25 tablet 2  . omeprazole (PRILOSEC) 20 MG capsule Take 20 mg by mouth daily.    . rivaroxaban (XARELTO) 20 MG TABS tablet Take 1 tablet (20 mg total) by mouth daily. 30 tablet 6  . torsemide (DEMADEX) 20 MG tablet Take 1 tablet (20 mg total) by mouth 2 (two) times daily. 120 tablet 2   No current facility-administered medications for this visit.     Past  Medical History  Diagnosis Date  . Ischemic cardiomyopathy     cardiomyopathy ischemic  . Paroxysmal atrial fibrillation   . Renal calculi   . 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 Chronic systolic dysfunction of left ventricle   . Coronary artery disease   . Chronic pain     chronic left abdominal pain  . ICD (implantable cardioverter-defibrillator), single, in situ     ROS:   All systems reviewed and negative except as noted in the HPI.   Past Surgical History  Procedure Laterality Date  . Difibrillator insertion  12/31/06    [facility] Current DR RF ICD implanted by Dr Graciela HusbandsKlein     Family History  Problem Relation Age of Onset  . Coronary artery disease Other     family history of CAD  . Cancer Mother      History   Social History  . Marital Status: Divorced    Spouse Name: N/A    Number of Children: N/A  . Years of Education: N/A   Occupational History  . RETIRED  Social History Main Topics  . Smoking status: Current Some Day Smoker -- 0.50 packs/day for 40 years    Types: Cigarettes  . Smokeless tobacco: Current User     Comment: trying to quit; smoked 1 cigarette since 06/02/14  . Alcohol Use: No  . Drug Use: No  . Sexual Activity: No   Other Topics Concern  . Not on file   Social History Narrative   DISABLED ,DIVORCED     BP 86/56 mmHg  Pulse 91  Ht 5\' 7"  (1.702 m)  Wt 186 lb 6.4 oz (84.55 kg)  BMI 29.19 kg/m2  Physical Exam:  stable appearing middle-aged man, NAD HEENT: Unremarkable Neck:  6 cm JVD, no thyromegally Back:  No CVA tenderness Lungs:  Clear with no wheezes or rhonchi, scattered rales are present. HEART:  IRegular rate rhythm, no murmurs, no rubs, no clicks Abd:  soft, positive bowel sounds, no organomegally, no rebound, no guarding Ext:  2 plus pulses, no edema, no cyanosis, no clubbing Skin:  No rashes no nodules Neuro:  CN II through XII intact, motor grossly intact   DEVICE  Normal device  function.  See PaceArt for details. [facility]  Assess/Plan:

## 2014-07-03 NOTE — Discharge Instructions (Signed)
Pacemaker Battery Change, Care After °Refer to this sheet in the next few weeks. These instructions provide you with information on caring for yourself after your procedure. Your health care provider may also give you more specific instructions. Your treatment has been planned according to current medical practices, but problems sometimes occur. Call your health care provider if you have any problems or questions after your procedure. °WHAT TO EXPECT AFTER THE PROCEDURE °After your procedure, it is typical to have the following sensations: °· Soreness at the pacemaker site. °HOME CARE INSTRUCTIONS  °· Keep the incision clean and dry. °· Unless advised otherwise, you may shower beginning 48 hours after your procedure. °· For the first week after the replacement, avoid stretching motions that pull at the incision site, and avoid heavy exercise with the arm that is on the same side as the incision. °· Take medicines only as directed by your health care provider. °· Keep all follow-up visits as directed by your health care provider. °SEEK MEDICAL CARE IF:  °· You have pain at the incision site that is not relieved by over-the-counter or prescription medicine. °· There is drainage or pus from the incision site. °· There is swelling larger than a lime at the incision site. °· You develop red streaking that extends above or below the incision site. °· You feel brief, intermittent palpitations, light-headedness, or any symptoms that you feel might be related to your heart. °SEEK IMMEDIATE MEDICAL CARE IF:  °· You experience chest pain that is different than the pain at the pacemaker site. °· You experience shortness of breath. °· You have palpitations or irregular heartbeat. °· You have light-headedness that does not go away quickly. °· You faint. °· You have pain that gets worse and is not relieved by medicine. °Document Released: 05/04/2013 Document Revised: 11/28/2013 Document Reviewed: 05/04/2013 °[employer]® Patient  Information ©2015 [employer]. This information is not intended to replace advice given to you by your health care provider. Make sure you discuss any questions you have with your health care provider. ° °

## 2014-07-04 ENCOUNTER — Other Ambulatory Visit: Payer: Self-pay | Admitting: Internal Medicine

## 2014-07-04 DIAGNOSIS — J441 Chronic obstructive pulmonary disease with (acute) exacerbation: Secondary | ICD-10-CM

## 2014-07-06 ENCOUNTER — Encounter (HOSPITAL_COMMUNITY): Payer: Self-pay | Admitting: Internal Medicine

## 2014-07-12 ENCOUNTER — Ambulatory Visit: Payer: Medicare Other | Admitting: Internal Medicine

## 2014-07-13 ENCOUNTER — Encounter (HOSPITAL_COMMUNITY): Payer: Self-pay

## 2014-07-13 ENCOUNTER — Ambulatory Visit (HOSPITAL_COMMUNITY)
Admission: RE | Admit: 2014-07-13 | Discharge: 2014-07-13 | Disposition: A | Discharge status: Home / Self Care | Payer: Medicare Other | Source: Ambulatory Visit | Attending: Internal Medicine | Admitting: Internal Medicine

## 2014-07-13 VITALS — BP 90/62 | HR 93 | Resp 20 | Wt 187.0 lb

## 2014-07-13 DIAGNOSIS — I482 Chronic atrial fibrillation, unspecified: Secondary | ICD-10-CM

## 2014-07-13 DIAGNOSIS — Z79899 Other long term (current) drug therapy: Secondary | ICD-10-CM | POA: Insufficient documentation

## 2014-07-13 DIAGNOSIS — E785 Hyperlipidemia, unspecified: Secondary | ICD-10-CM

## 2014-07-13 DIAGNOSIS — Z7901 Long term (current) use of anticoagulants: Secondary | ICD-10-CM | POA: Diagnosis not present

## 2014-07-13 DIAGNOSIS — F17210 Nicotine dependence, cigarettes, uncomplicated: Secondary | ICD-10-CM | POA: Insufficient documentation

## 2014-07-13 DIAGNOSIS — J449 Chronic obstructive pulmonary disease, unspecified: Secondary | ICD-10-CM | POA: Insufficient documentation

## 2014-07-13 DIAGNOSIS — I255 Ischemic cardiomyopathy: Secondary | ICD-10-CM

## 2014-07-13 DIAGNOSIS — I2510 Atherosclerotic heart disease of native coronary artery without angina pectoris: Secondary | ICD-10-CM

## 2014-07-13 DIAGNOSIS — I5022 Chronic systolic (congestive) heart failure: Secondary | ICD-10-CM | POA: Diagnosis not present

## 2014-07-13 NOTE — Patient Instructions (Signed)
Follow up 2 months with echocardiogram.  [name] CabalMerry Christmas and Happy New Year!  Do the following things EVERYDAY: 1) Weigh yourself in the morning before breakfast. Write it down and keep it in a log. 2) Take your medicines as prescribed 3) Eat low salt foods-Limit salt (sodium) to 2000 mg per day.  4) Stay as active as you can everyday 5) Limit all fluids for the day to less than 2 liters

## 2014-07-13 NOTE — Progress Notes (Signed)
Patient ID: [name] LemonsReuben S [name], male   DOB: 1950-11-18, 63 y.o.   MRN: [id number] PCP: Dr. [name] Primary Cardiologist: Dr. [name]  HPI: Mr. [name] FalconerCampbell is a 63 yo male with a history of CAD s/p multiple stents, ICM s.p ICD, chronic systolic heart failure, tobacco abuse, COPD and chronic atrial fibrillation.   Admitted 10/28-11/6/15 for A/C respiratory failure and A/C HF. Diuresed with IV lasix and found to have R pleural effusion too small to tap. Discharge weight 187 lbs.   Follow up for Heart Failure: Overall feeling pretty good. Denies SOB, PND or CP. +chronic orthopnea (sleeps in recliner). Taking medications as prescribed. Had ICD replaced for ERI. Sleeps on 2L O2 at night. Weight at home 184-186 lbs. Following a low salt diet and drinking less than 2L day. Smoking about 3 cigarettes a week. Able to walk about 50-100 ft before stopping d/t SOB.   Studies: LHC 2008: moderately severe left ventricular dysfunction with elevated LVEDP and wall motion abnormalities, continued patency of the LAD stent with moderate ostial LAD disease, although not critical, continued patency of the circumflex stent with subtotal occlusion of the AV circumflex which is small, and mild to moderate ostial narrowing of the RCA with continued patency of the mid vessel stent Echo (07/2013): EF 20-25%, grade II DD  ROS: All systems negative except as listed in HPI, PMH and Problem List.  SH:  History   Social History  . Marital Status: Divorced    Spouse Name: N/A    Number of Children: N/A  . Years of Education: N/A   Occupational History  . RETIRED    Social History Main Topics  . Smoking status: Current Some Day Smoker -- 0.50 packs/day for 40 years    Types: Cigarettes  . Smokeless tobacco: Current User  . Alcohol Use: No  . Drug Use: No  . Sexual Activity: No   Other Topics Concern  . Not on file   Social History Narrative   DISABLED ,DIVORCED    FH:  Family History  Problem Relation Age of  Onset  . Coronary artery disease Other     family history of CAD  . Cancer Mother     Past Medical History  Diagnosis Date  . Ischemic cardiomyopathy     cardiomyopathy ischemic  . Paroxysmal atrial fibrillation   . Renal calculi   . 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 Chronic systolic dysfunction of left ventricle   . Coronary artery disease   . Chronic pain     chronic left abdominal pain  . ICD (implantable cardioverter-defibrillator), single, in situ     Current Outpatient Prescriptions  Medication Sig Dispense Refill  . atorvastatin (LIPITOR) 40 MG tablet Take 1 tablet (40 mg total) by mouth daily. 30 tablet 3  . B Complex-C (B-COMPLEX WITH VITAMIN C) tablet Take 1 tablet by mouth daily.    . budesonide-formoterol (SYMBICORT) 160-4.5 MCG/ACT inhaler Inhale 2 puffs into the lungs 2 (two) times daily.    . ciprofloxacin (CIPRO) 500 MG tablet Take 500 mg by mouth 2 (two) times daily.    . colchicine 0.6 MG tablet Take 0.6 mg by mouth 2 (two) times daily as needed (for gout).     Marland Kitchen. digoxin (LANOXIN) 0.125 MG tablet Take 1 tablet (0.125 mg total) by mouth daily. 90 tablet 3  . gabapentin (NEURONTIN) 300 MG capsule Take 300 mg by mouth at bedtime.    Marland Kitchen. HYDROcodone-acetaminophen (NORCO/VICODIN) 5-325  MG per tablet Take 1 tablet by mouth 2 (two) times daily as needed for moderate pain.    Marland Kitchen. levalbuterol (XOPENEX HFA) 45 MCG/ACT inhaler Inhale 1-2 puffs into the lungs every 6 (six) hours as needed for wheezing. (Patient taking differently: Inhale 2 puffs into the lungs every 4 (four) hours as needed for wheezing. ) 1 Inhaler 12  . losartan (COZAAR) 50 MG tablet Take 1 tablet (50 mg total) by mouth daily. 90 tablet 3  . metoprolol succinate (TOPROL-XL) 25 MG 24 hr tablet Take 1 tablet (25 mg total) by mouth daily. 30 tablet 0  . nitroGLYCERIN (NITROSTAT) 0.4 MG SL tablet Place 1 tablet (0.4 mg total) under the tongue every 5 (five) minutes as needed for  chest pain. 25 tablet 2  . omeprazole (PRILOSEC) 20 MG capsule Take 20 mg by mouth daily.    . rivaroxaban (XARELTO) 20 MG TABS tablet Take 1 tablet (20 mg total) by mouth daily. 30 tablet 6  . torsemide (DEMADEX) 20 MG tablet Take 1 tablet (20 mg total) by mouth 2 (two) times daily. 120 tablet 2   No current facility-administered medications for this encounter.    Filed Vitals:   07/13/14 0932  BP: 90/62  Pulse: 93  Resp: 20  Weight: 187 lb (84.823 kg)  SpO2: 92%    PHYSICAL EXAM: General:  Elderly appearing. No resp difficulty HEENT: normal Neck: supple. JVP 7. Carotids 2+ bilaterally; no bruits. No lymphadenopathy or thryomegaly appreciated. Cor: PMI normal. Irregular rhythm and rate controlled Lungs: distant breath sounds and L upper inspiratory wheezes. Abdomen: soft, nontender, nondistended. No hepatosplenomegaly. No bruits or masses. Good bowel sounds. Extremities: no cyanosis, clubbing, rash, edema Neuro: alert & orientedx3, cranial nerves grossly intact. Moves all 4 extremities w/o difficulty. Affect pleasant.   ASSESSMENT & PLAN:  1) Chronic systolic HF: ICM s/p ICD, EF 20-25%, grade II DD (07/2013) - NYHA III symptoms and volume status stable. Will continue torsemide 20 mg BID. Last renal function stable and BNP was improved.  - Continue Toprol, digoxin and losartan at current doses. Can't titrate with soft BP.  - Repeat Echo next visit to reassess EF.  - Reinforced the need and importance of daily weights, a low sodium diet, and fluid restriction (less than 2 L a day). Instructed to call the HF clinic if weight increases more than 3 lbs overnight or 5 lbs in a week.  2) COPD - I think this is the biggest issue with his breathing. Continue to follow up with pulmonologist. He did have small bilateral pleural effusions on CT R>L but to small for tap. He also had 1.5 x 1.2 cm nodule on LUL. Recommending IR bx and RT once breathing better.  3) Atrial Fibrillation - Rate  controlled on Toprol. Continue Xarelto 20 mg daily. No s/s of bleeding 4) Tobacco abuse: - Smokes about 3 cigarettes a week. Congratulated patient on cutting back and encouraged him to quit completely. 5) CAD - s/p multiple stents. Denies any s/s of ischemia. Continue statin.    F/U 2 months with Echo Ulla [place], Peace Jost B NP-C 9:43 AM

## 2014-08-01 ENCOUNTER — Ambulatory Visit: Payer: Medicare Other | Admitting: Internal Medicine

## 2014-08-11 ENCOUNTER — Telehealth: Payer: Self-pay | Admitting: Internal Medicine

## 2014-08-11 NOTE — Telephone Encounter (Signed)
Called and spoke with pt and he is aware that from his last visit with MW---he wanted to see him back in 2 weeks.  appt scheduled for pt to see MW on Tuesday at 3.  Pt is aware of date and time of appt.

## 2014-08-15 ENCOUNTER — Ambulatory Visit: Payer: Medicare Other | Admitting: Internal Medicine

## 2014-08-28 ENCOUNTER — Other Ambulatory Visit: Payer: Self-pay | Admitting: Internal Medicine

## 2014-08-28 MED ORDER — TORSEMIDE 20 MG PO TABS: 20.0000 mg | ORAL_TABLET | Freq: Two times a day (BID) | ORAL | Status: DC

## 2014-08-28 NOTE — Telephone Encounter (Signed)
escribed refill as requested 

## 2014-08-28 NOTE — Telephone Encounter (Signed)
Received fax refill request  Rx # [id number] Medication:  Torsemide 20 mg tab Qty 120 Sig:  Take one tablet by mouth twice daily Physician:  [name]

## 2014-09-14 ENCOUNTER — Emergency Department (HOSPITAL_COMMUNITY): Payer: Medicare Other

## 2014-09-14 ENCOUNTER — Inpatient Hospital Stay (HOSPITAL_COMMUNITY): Payer: Medicare Other

## 2014-09-14 ENCOUNTER — Encounter (HOSPITAL_COMMUNITY): Payer: Self-pay

## 2014-09-14 ENCOUNTER — Inpatient Hospital Stay (HOSPITAL_COMMUNITY)
Admission: EM | Admit: 2014-09-14 | Discharge: 2014-09-22 | DRG: 871 | Disposition: A | Discharge status: Hospice | Payer: Medicare Other | Attending: Internal Medicine | Admitting: Internal Medicine

## 2014-09-14 DIAGNOSIS — R55 Syncope and collapse: Secondary | ICD-10-CM | POA: Diagnosis present

## 2014-09-14 DIAGNOSIS — E876 Hypokalemia: Secondary | ICD-10-CM | POA: Diagnosis not present

## 2014-09-14 DIAGNOSIS — Z7901 Long term (current) use of anticoagulants: Secondary | ICD-10-CM

## 2014-09-14 DIAGNOSIS — Z7951 Long term (current) use of inhaled steroids: Secondary | ICD-10-CM

## 2014-09-14 DIAGNOSIS — I5022 Chronic systolic (congestive) heart failure: Secondary | ICD-10-CM

## 2014-09-14 DIAGNOSIS — E872 Acidosis, unspecified: Secondary | ICD-10-CM | POA: Diagnosis present

## 2014-09-14 DIAGNOSIS — Z66 Do not resuscitate: Secondary | ICD-10-CM | POA: Diagnosis present

## 2014-09-14 DIAGNOSIS — I10 Essential (primary) hypertension: Secondary | ICD-10-CM | POA: Diagnosis present

## 2014-09-14 DIAGNOSIS — F17210 Nicotine dependence, cigarettes, uncomplicated: Secondary | ICD-10-CM | POA: Diagnosis present

## 2014-09-14 DIAGNOSIS — Z95810 Presence of automatic (implantable) cardiac defibrillator: Secondary | ICD-10-CM | POA: Diagnosis not present

## 2014-09-14 DIAGNOSIS — Z515 Encounter for palliative care: Secondary | ICD-10-CM

## 2014-09-14 DIAGNOSIS — Z955 Presence of coronary angioplasty implant and graft: Secondary | ICD-10-CM | POA: Diagnosis not present

## 2014-09-14 DIAGNOSIS — Z79891 Long term (current) use of opiate analgesic: Secondary | ICD-10-CM | POA: Diagnosis not present

## 2014-09-14 DIAGNOSIS — I509 Heart failure, unspecified: Secondary | ICD-10-CM | POA: Diagnosis not present

## 2014-09-14 DIAGNOSIS — Z9049 Acquired absence of other specified parts of digestive tract: Secondary | ICD-10-CM | POA: Diagnosis present

## 2014-09-14 DIAGNOSIS — I480 Paroxysmal atrial fibrillation: Secondary | ICD-10-CM | POA: Diagnosis present

## 2014-09-14 DIAGNOSIS — J189 Pneumonia, unspecified organism: Secondary | ICD-10-CM | POA: Diagnosis present

## 2014-09-14 DIAGNOSIS — T501X5A Adverse effect of loop [high-ceiling] diuretics, initial encounter: Secondary | ICD-10-CM | POA: Diagnosis present

## 2014-09-14 DIAGNOSIS — J9690 Respiratory failure, unspecified, unspecified whether with hypoxia or hypercapnia: Secondary | ICD-10-CM

## 2014-09-14 DIAGNOSIS — R911 Solitary pulmonary nodule: Secondary | ICD-10-CM | POA: Diagnosis present

## 2014-09-14 DIAGNOSIS — Z8249 Family history of ischemic heart disease and other diseases of the circulatory system: Secondary | ICD-10-CM

## 2014-09-14 DIAGNOSIS — D72829 Elevated white blood cell count, unspecified: Secondary | ICD-10-CM | POA: Diagnosis present

## 2014-09-14 DIAGNOSIS — I2510 Atherosclerotic heart disease of native coronary artery without angina pectoris: Secondary | ICD-10-CM | POA: Diagnosis present

## 2014-09-14 DIAGNOSIS — F419 Anxiety disorder, unspecified: Secondary | ICD-10-CM | POA: Diagnosis present

## 2014-09-14 DIAGNOSIS — I482 Chronic atrial fibrillation, unspecified: Secondary | ICD-10-CM | POA: Diagnosis present

## 2014-09-14 DIAGNOSIS — E785 Hyperlipidemia, unspecified: Secondary | ICD-10-CM | POA: Diagnosis present

## 2014-09-14 DIAGNOSIS — J9602 Acute respiratory failure with hypercapnia: Secondary | ICD-10-CM | POA: Diagnosis present

## 2014-09-14 DIAGNOSIS — I248 Other forms of acute ischemic heart disease: Secondary | ICD-10-CM | POA: Diagnosis present

## 2014-09-14 DIAGNOSIS — R7989 Other specified abnormal findings of blood chemistry: Secondary | ICD-10-CM | POA: Diagnosis present

## 2014-09-14 DIAGNOSIS — R0602 Shortness of breath: Secondary | ICD-10-CM

## 2014-09-14 DIAGNOSIS — J9601 Acute respiratory failure with hypoxia: Secondary | ICD-10-CM | POA: Diagnosis present

## 2014-09-14 DIAGNOSIS — A419 Sepsis, unspecified organism: Principal | ICD-10-CM | POA: Diagnosis present

## 2014-09-14 DIAGNOSIS — J90 Pleural effusion, not elsewhere classified: Secondary | ICD-10-CM | POA: Diagnosis present

## 2014-09-14 DIAGNOSIS — R778 Other specified abnormalities of plasma proteins: Secondary | ICD-10-CM | POA: Diagnosis present

## 2014-09-14 DIAGNOSIS — J441 Chronic obstructive pulmonary disease with (acute) exacerbation: Secondary | ICD-10-CM | POA: Diagnosis present

## 2014-09-14 DIAGNOSIS — I255 Ischemic cardiomyopathy: Secondary | ICD-10-CM | POA: Diagnosis present

## 2014-09-14 DIAGNOSIS — I5043 Acute on chronic combined systolic (congestive) and diastolic (congestive) heart failure: Secondary | ICD-10-CM | POA: Diagnosis present

## 2014-09-14 HISTORY — DX: Hyperlipidemia, unspecified: E78.5

## 2014-09-14 HISTORY — DX: Cough: R05

## 2014-09-14 HISTORY — DX: Chronic systolic (congestive) heart failure: I50.22

## 2014-09-14 HISTORY — DX: Presence of automatic (implantable) cardiac defibrillator: Z95.810

## 2014-09-14 HISTORY — DX: Chronic atrial fibrillation, unspecified: I48.20

## 2014-09-14 LAB — CBC WITH DIFFERENTIAL/PLATELET
Basophils Absolute: 0.1 10*3/uL (ref 0.0–0.1)
Basophils Relative: 1 % (ref 0–1)
Eosinophils Absolute: 0.8 10*3/uL — ABNORMAL HIGH (ref 0.0–0.7)
Eosinophils Relative: 7 % — ABNORMAL HIGH (ref 0–5)
HEMATOCRIT: 44.3 % (ref 39.0–52.0)
Hemoglobin: 14.7 g/dL (ref 13.0–17.0)
LYMPHS ABS: 1.5 10*3/uL (ref 0.7–4.0)
Lymphocytes Relative: 14 % (ref 12–46)
MCH: 32.2 pg (ref 26.0–34.0)
MCHC: 33.2 g/dL (ref 30.0–36.0)
MCV: 96.9 fL (ref 78.0–100.0)
MONOS PCT: 7 % (ref 3–12)
Monocytes Absolute: 0.7 10*3/uL (ref 0.1–1.0)
Neutro Abs: 7.7 10*3/uL (ref 1.7–7.7)
Neutrophils Relative %: 71 % (ref 43–77)
Platelets: 204 10*3/uL (ref 150–400)
RBC: 4.57 MIL/uL (ref 4.22–5.81)
RDW: 13.6 % (ref 11.5–15.5)
WBC: 10.7 10*3/uL — AB (ref 4.0–10.5)

## 2014-09-14 LAB — I-STAT CG4 LACTIC ACID, ED: Lactic Acid, Venous: 2.66 mmol/L (ref 0.5–2.0)

## 2014-09-14 LAB — BRAIN NATRIURETIC PEPTIDE: B Natriuretic Peptide: 335 pg/mL — ABNORMAL HIGH (ref 0.0–100.0)

## 2014-09-14 LAB — BASIC METABOLIC PANEL
Anion gap: 5 (ref 5–15)
BUN: 13 mg/dL (ref 6–23)
CHLORIDE: 95 mmol/L — AB (ref 96–112)
CO2: 39 mmol/L — ABNORMAL HIGH (ref 19–32)
Calcium: 8.9 mg/dL (ref 8.4–10.5)
Creatinine, Ser: 0.94 mg/dL (ref 0.50–1.35)
GFR, EST NON AFRICAN AMERICAN: 86 mL/min — AB (ref >90)
Glucose, Bld: 122 mg/dL — ABNORMAL HIGH (ref 70–99)
POTASSIUM: 2.8 mmol/L — AB (ref 3.5–5.1)
Sodium: 139 mmol/L (ref 135–145)

## 2014-09-14 LAB — I-STAT TROPONIN, ED: Troponin i, poc: 0.01 ng/mL (ref 0.00–0.08)

## 2014-09-14 LAB — DIGOXIN LEVEL: Digoxin Level: 0.6 ng/mL — ABNORMAL LOW (ref 0.8–2.0)

## 2014-09-14 LAB — TSH: TSH: 1.551 u[IU]/mL (ref 0.350–4.500)

## 2014-09-14 LAB — TROPONIN I: TROPONIN I: 0.04 ng/mL — AB (ref <0.031)

## 2014-09-14 MED ORDER — SODIUM CHLORIDE 0.9 % IJ SOLN: 3.0000 mL | INTRAMUSCULAR | Status: DC | PRN

## 2014-09-14 MED ORDER — ATORVASTATIN CALCIUM 40 MG PO TABS
40.0000 mg | ORAL_TABLET | Freq: Every day | ORAL | Status: DC
  Administered 2014-09-15 – 2014-09-18 (×4): 40 mg via ORAL
  Filled 2014-09-14 (×5): qty 1

## 2014-09-14 MED ORDER — RIVAROXABAN 20 MG PO TABS
20.0000 mg | ORAL_TABLET | Freq: Every day | ORAL | Status: DC
  Administered 2014-09-15 – 2014-09-20 (×6): 20 mg via ORAL
  Filled 2014-09-14 (×7): qty 1

## 2014-09-14 MED ORDER — NITROGLYCERIN 0.4 MG SL SUBL: 0.4000 mg | SUBLINGUAL_TABLET | SUBLINGUAL | Status: DC | PRN

## 2014-09-14 MED ORDER — IOHEXOL 300 MG/ML  SOLN
80.0000 mL | Freq: Once | INTRAMUSCULAR | Status: AC | PRN
  Administered 2014-09-14: 80 mL via INTRAVENOUS

## 2014-09-14 MED ORDER — ONDANSETRON HCL 4 MG PO TABS: 4.0000 mg | ORAL_TABLET | Freq: Four times a day (QID) | ORAL | Status: DC | PRN

## 2014-09-14 MED ORDER — POTASSIUM CHLORIDE 10 MEQ/100ML IV SOLN
10.0000 meq | Freq: Once | INTRAVENOUS | Status: AC
  Administered 2014-09-14: 10 meq via INTRAVENOUS

## 2014-09-14 MED ORDER — LEVOFLOXACIN IN D5W 750 MG/150ML IV SOLN
750.0000 mg | Freq: Once | INTRAVENOUS | Status: AC
  Administered 2014-09-14: 750 mg via INTRAVENOUS
  Filled 2014-09-14: qty 150

## 2014-09-14 MED ORDER — BUDESONIDE-FORMOTEROL FUMARATE 160-4.5 MCG/ACT IN AERO
INHALATION_SPRAY | RESPIRATORY_TRACT | Status: AC
  Filled 2014-09-14: qty 6

## 2014-09-14 MED ORDER — DIGOXIN 125 MCG PO TABS
0.1250 mg | ORAL_TABLET | Freq: Every day | ORAL | Status: DC
  Administered 2014-09-15: 0.125 mg via ORAL
  Filled 2014-09-14 (×2): qty 1

## 2014-09-14 MED ORDER — LEVALBUTEROL TARTRATE 45 MCG/ACT IN AERO: 1.0000 | INHALATION_SPRAY | Freq: Four times a day (QID) | RESPIRATORY_TRACT | Status: DC | PRN

## 2014-09-14 MED ORDER — ONDANSETRON HCL 4 MG/2ML IJ SOLN
4.0000 mg | Freq: Four times a day (QID) | INTRAMUSCULAR | Status: DC | PRN
  Administered 2014-09-17 – 2014-09-19 (×2): 4 mg via INTRAVENOUS
  Filled 2014-09-14 (×2): qty 2

## 2014-09-14 MED ORDER — PANTOPRAZOLE SODIUM 40 MG PO TBEC
40.0000 mg | DELAYED_RELEASE_TABLET | Freq: Every day | ORAL | Status: DC
  Administered 2014-09-15 – 2014-09-20 (×6): 40 mg via ORAL
  Filled 2014-09-14 (×7): qty 1

## 2014-09-14 MED ORDER — B COMPLEX-C PO TABS
1.0000 | ORAL_TABLET | Freq: Every day | ORAL | Status: DC
  Administered 2014-09-15 – 2014-09-20 (×5): 1 via ORAL
  Filled 2014-09-14 (×11): qty 1

## 2014-09-14 MED ORDER — POTASSIUM CHLORIDE 10 MEQ/100ML IV SOLN
10.0000 meq | Freq: Once | INTRAVENOUS | Status: DC
  Filled 2014-09-14: qty 100

## 2014-09-14 MED ORDER — SODIUM CHLORIDE 0.9 % IJ SOLN
3.0000 mL | Freq: Two times a day (BID) | INTRAMUSCULAR | Status: DC
  Administered 2014-09-14 – 2014-09-15 (×2): 3 mL via INTRAVENOUS

## 2014-09-14 MED ORDER — PANTOPRAZOLE SODIUM 40 MG PO TBEC: 40.0000 mg | DELAYED_RELEASE_TABLET | Freq: Every day | ORAL | Status: DC

## 2014-09-14 MED ORDER — SODIUM CHLORIDE 0.9 % IJ SOLN
3.0000 mL | Freq: Two times a day (BID) | INTRAMUSCULAR | Status: DC
  Administered 2014-09-15: 3 mL via INTRAVENOUS

## 2014-09-14 MED ORDER — IPRATROPIUM-ALBUTEROL 0.5-2.5 (3) MG/3ML IN SOLN
3.0000 mL | Freq: Once | RESPIRATORY_TRACT | Status: AC
  Administered 2014-09-14: 3 mL via RESPIRATORY_TRACT
  Filled 2014-09-14: qty 3

## 2014-09-14 MED ORDER — POTASSIUM CHLORIDE CRYS ER 20 MEQ PO TBCR
40.0000 meq | EXTENDED_RELEASE_TABLET | Freq: Once | ORAL | Status: AC
  Administered 2014-09-14: 40 meq via ORAL
  Filled 2014-09-14: qty 2

## 2014-09-14 MED ORDER — HYDROCODONE-ACETAMINOPHEN 5-325 MG PO TABS
1.0000 | ORAL_TABLET | Freq: Two times a day (BID) | ORAL | Status: DC | PRN
  Administered 2014-09-15 – 2014-09-19 (×5): 1 via ORAL
  Filled 2014-09-14 (×5): qty 1

## 2014-09-14 MED ORDER — TORSEMIDE 20 MG PO TABS
20.0000 mg | ORAL_TABLET | Freq: Two times a day (BID) | ORAL | Status: DC
  Administered 2014-09-14 – 2014-09-20 (×11): 20 mg via ORAL
  Filled 2014-09-14 (×12): qty 1

## 2014-09-14 MED ORDER — GABAPENTIN 300 MG PO CAPS
300.0000 mg | ORAL_CAPSULE | Freq: Every day | ORAL | Status: DC
  Administered 2014-09-14 – 2014-09-18 (×5): 300 mg via ORAL
  Filled 2014-09-14 (×6): qty 1

## 2014-09-14 MED ORDER — SODIUM CHLORIDE 0.9 % IV SOLN: 250.0000 mL | INTRAVENOUS | Status: DC | PRN

## 2014-09-14 MED ORDER — LOSARTAN POTASSIUM 50 MG PO TABS
25.0000 mg | ORAL_TABLET | Freq: Every day | ORAL | Status: DC
  Administered 2014-09-15: 25 mg via ORAL
  Filled 2014-09-14 (×2): qty 1

## 2014-09-14 MED ORDER — BUDESONIDE-FORMOTEROL FUMARATE 160-4.5 MCG/ACT IN AERO
2.0000 | INHALATION_SPRAY | Freq: Two times a day (BID) | RESPIRATORY_TRACT | Status: DC
  Administered 2014-09-14 – 2014-09-15 (×2): 2 via RESPIRATORY_TRACT
  Filled 2014-09-14: qty 6

## 2014-09-14 MED ORDER — LOSARTAN POTASSIUM 50 MG PO TABS: 50.0000 mg | ORAL_TABLET | Freq: Every day | ORAL | Status: DC

## 2014-09-14 MED ORDER — ALBUTEROL SULFATE (2.5 MG/3ML) 0.083% IN NEBU
2.5000 mg | INHALATION_SOLUTION | Freq: Four times a day (QID) | RESPIRATORY_TRACT | Status: DC | PRN
  Administered 2014-09-14 – 2014-09-15 (×2): 2.5 mg via RESPIRATORY_TRACT
  Filled 2014-09-14 (×2): qty 3

## 2014-09-14 MED ORDER — VANCOMYCIN HCL IN DEXTROSE 1-5 GM/200ML-% IV SOLN
1000.0000 mg | Freq: Once | INTRAVENOUS | Status: DC
  Filled 2014-09-14: qty 200

## 2014-09-14 MED ORDER — COLCHICINE 0.6 MG PO TABS: 0.6000 mg | ORAL_TABLET | Freq: Two times a day (BID) | ORAL | Status: DC | PRN

## 2014-09-14 MED ORDER — METOPROLOL SUCCINATE ER 25 MG PO TB24
25.0000 mg | ORAL_TABLET | Freq: Every day | ORAL | Status: DC
  Administered 2014-09-15 – 2014-09-20 (×4): 25 mg via ORAL
  Filled 2014-09-14 (×6): qty 1

## 2014-09-14 NOTE — ED Notes (Signed)
Attempted to call report, nurse unavailable.

## 2014-09-14 NOTE — ED Notes (Signed)
Called for report, was informed that the oncoming nurse for next shift will take the pt.

## 2014-09-14 NOTE — ED Notes (Signed)
Pt states he was sitting in a chair and fell backward. Complain of pain in the back of his head and low back .Pt denies any symptoms prior to passing out but as he was being placed into the ED bed he stated his defibrillator went off

## 2014-09-14 NOTE — H&P (Signed)
[facility] History and Physical  [name] Horne YNW:[id number] DOB: 04/17/1951 DOA: 09/14/2014  Referring physician: ER PCP: [name],ASHISH, MD   Chief Complaint: Syncope  HPI: [name] Beidleman is a 64 y.o. male  This is a 64 year old man who had an episode today at noon of syncope. He felt generally unwell before he loss consciousness. He thinks he was unconscious for what appeared to be 5-10 minutes but he is not quite sure of this. When he regained consciousness, he was quite aware of his surroundings. There was no apparent seizure activity seen by a friend that was at his house. He did admit to fluttering of his heart prior to the syncope, but he says this is a chronic problem as he has atrial fibrillation. In the emergency room after he came, he says that his defibrillator fired. He has a history of ischemic cardiomyopathy with significantly reduced ejection fraction in the 15-20% range. He denies excessive dyspnea although he has been short of breath. There is no chest pain. There is no fever. There is no cough that is productive. In fact he has very minimal cough. Evaluation in the emergency room by chest x-ray suggestive of a possible pneumonia. He is now being admitted for further management.   Review of Systems:  Apart from symptoms above, all systems negative.  Past Medical History  Diagnosis Date  . Ischemic cardiomyopathy     cardiomyopathy ischemic  . Paroxysmal atrial fibrillation   . Renal calculi   . 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 Chronic systolic dysfunction of left ventricle   . Coronary artery disease   . Chronic pain     chronic left abdominal pain  . ICD (implantable cardioverter-defibrillator), single, in situ    Past Surgical History  Procedure Laterality Date  . Difibrillator insertion  12/31/06    SJM Current DR RF ICD implanted by Dr Graciela HusbandsKlein  . Cholecystectomy    . Cardioversion N/A 12/10/2012    Procedure:  CARDIOVERSION;  Surgeon: Marinus MawGregg W Taylor, MD;  [facility];  Service: Cardiovascular;  Laterality: N/A;  . Implantable cardioverter defibrillator (icd) generator change N/A 07/03/2014    Procedure: ICD GENERATOR CHANGE;  Surgeon: Marinus MawGregg W Taylor, MD;  [facility];  Service: Cardiovascular;  Laterality: N/A;   Social History:  reports that he has been smoking Cigarettes.  He has a 20 pack-year smoking history. He uses smokeless tobacco. He reports that he does not drink alcohol or use illicit drugs.  No Known Allergies  Family History  Problem Relation Age of Onset  . Coronary artery disease Other     family history of CAD  . Cancer Mother     Prior to Admission medications   Medication Sig Start Date End Date Taking? Authorizing Provider  atorvastatin (LIPITOR) 40 MG tablet Take 1 tablet (40 mg total) by mouth daily. 06/08/14  Yes Aundria RudAli B Cosgrove, NP  B Complex-C (B-COMPLEX WITH VITAMIN C) tablet Take 1 tablet by mouth daily.   Yes Historical Provider, MD  budesonide-formoterol (SYMBICORT) 160-4.5 MCG/ACT inhaler Inhale 2 puffs into the lungs 2 (two) times daily.   Yes Historical Provider, MD  colchicine 0.6 MG tablet Take 0.6 mg by mouth 2 (two) times daily as needed (for gout).    Yes Historical Provider, MD  digoxin (LANOXIN) 0.125 MG tablet Take 1 tablet (0.125 mg total) by mouth daily. 11/11/13  Yes Marinus MawGregg W Taylor, MD  gabapentin (NEURONTIN) 300 MG  capsule Take 300 mg by mouth at bedtime.   Yes Historical Provider, MD  HYDROcodone-acetaminophen (NORCO/VICODIN) 5-325 MG per tablet Take 1 tablet by mouth 2 (two) times daily as needed for moderate pain.   Yes Historical Provider, MD  levalbuterol [facility] HFA) 45 MCG/ACT inhaler Inhale 1-2 puffs into the lungs every 6 (six) hours as needed for wheezing. Patient taking differently: Inhale 2 puffs into the lungs every 4 (four) hours as needed for wheezing.  06/02/14  Yes Calvert Cantor, MD  losartan (COZAAR) 25 MG tablet Take 25  mg by mouth daily.   Yes Historical Provider, MD  losartan (COZAAR) 50 MG tablet Take 1 tablet (50 mg total) by mouth daily. 06/08/14  Yes Aundria Rud, NP  metoprolol succinate (TOPROL-XL) 25 MG 24 hr tablet Take 1 tablet (25 mg total) by mouth daily. 06/02/14  Yes Calvert Cantor, MD  omeprazole (PRILOSEC) 20 MG capsule Take 20 mg by mouth daily.   Yes Historical Provider, MD  rivaroxaban (XARELTO) 20 MG TABS tablet Take 1 tablet (20 mg total) by mouth daily. 03/31/14  Yes Marinus Maw, MD  torsemide (DEMADEX) 20 MG tablet Take 1 tablet (20 mg total) by mouth 2 (two) times daily. 08/28/14  Yes Marinus Maw, MD  nitroGLYCERIN (NITROSTAT) 0.4 MG SL tablet Place 1 tablet (0.4 mg total) under the tongue every 5 (five) minutes as needed for chest pain. 08/02/13   Marinus Maw, MD   Physical Exam: Filed Vitals:   09/14/14 1706 09/14/14 1715 09/14/14 1808 09/14/14 1840  BP:   Pulse: 109 43 108 107  Temp:      TempSrc:      Resp: 33 SpO2: 96% 98% 100% 98%    Wt Readings from Last 3 Encounters:  07/03/14 83.915 kg (185 lb)  06/28/14 84.823 kg (187 lb)  06/21/14 84.55 kg (186 lb 6.4 oz)    General:  Appears calm and comfortable Eyes: PERRL, normal lids, irises & conjunctiva ENT: grossly normal hearing, lips & tongue Neck: no LAD, masses or thyromegaly Cardiovascular: Irregularly irregular, consistent with atrial fibrillation. Telemetry: Atrial fibrillation. Respiratory: Dullness to percussion bilaterally in the mid zones more the right than the left. Breath sounds are not heard in the right mid and lower zones and also to some extent in the left lower zone. There is no wheezing or bronchial breathing. There are no crackles. Abdomen: soft, ntnd Skin: no rash or induration seen on limited exam Musculoskeletal: grossly normal tone BUE/BLE Psychiatric: grossly normal mood and affect, speech fluent and appropriate Neurologic: grossly non-focal.          Labs on  Admission:  Basic Metabolic Panel:  Recent Labs Lab 09/14/14 1513  NA 139  K 2.8*  CL 95*  CO2 39*  GLUCOSE 122*  BUN 13  CREATININE 0.94  CALCIUM 8.9   Liver Function Tests: No results for input([name]): AST, ALT, ALKPHOS, BILITOT, PROT, ALBUMIN in the last 168 hours. No results for input([name]): LIPASE, AMYLASE in the last 168 hours. No results for input([name]): AMMONIA in the last 168 hours. CBC:  Recent Labs Lab 09/14/14 1513  WBC 10.7*  NEUTROABS 7.7  HGB 14.7  HCT 44.3  MCV 96.9  PLT 204   Cardiac Enzymes: No results for input([name]): CKTOTAL, CKMB, CKMBINDEX, TROPONINI in the last 168 hours.  BNP (last 3 results)  Recent Labs  09/14/14 1513  BNP 335.0*    ProBNP (last 3 results)  Recent  Labs  05/24/14 1001 05/30/14 0407 06/28/14 1202  PROBNP 2785.0* 4076.0* 392.0*    CBG: No results for input([name]): GLUCAP in the last 168 hours.  Radiological Exams on Admission: Dg Chest Portable 1 View  09/14/2014   CLINICAL DATA:  Shortness of breath  EXAM: PORTABLE CHEST - 1 VIEW  COMPARISON:  06/28/2014  FINDINGS: There is cardiomegaly and pulmonary venous congestion. Dense opacity at the right base, which appears more like airspace opacities than pleural effusion. No left-sided pleural effusion or pneumothorax. There is an nodular density overlapping the left anterior second rib which is a known nodule from chest CT 05/24/2014.  Dual chamber pacer leads from the right are in stable position.  IMPRESSION: 1. CHF. 2. Right basilar opacity which could reflect pneumonia or pleural fluid with atelectasis. Suggest two-view imaging when feasible. 3. Left upper lobe pulmonary nodule, reference chest CT 05/24/2014.   Electronically Signed   By: Marnee Spring M.D.   On: 09/14/2014 16:33    EKG: Independently reviewed. Appears to show sinus tachycardia. There are no acute ST-T wave changes.  Assessment/Plan   1. Syncope. The etiology of this is not entirely clear although I'm concerned  about an arrhythmia. We will cycle cardiac enzymes. We will ask cardiology to see this patient. 2. Possible pneumonia. He does not really have symptoms consistent with pneumonia. Nonetheless, we will obtain a CT scan of the chest to evaluate further the possibility of an infiltrate. I will not start him on antibiotics at this point in time unless the CT scan is suggestive of pneumonia. 3. Chronic systolic heart failure. This appears to be stable. 4. Chronic atrial fibrillation on chronic anticoagulation. Stable.  Further recommendations with apparent patient'[name] hospital progress.   Code Status: Full code  Family Communication: I discussed the plan with the patient at the bedside.  Disposition Plan: Home when medically stable.   Time spent: 60 minutes.  Wilson Singer [facility] Pager [phone].

## 2014-09-14 NOTE — ED Provider Notes (Signed)
CSN: [id number]     Arrival date & time 09/14/14  1445 History  This chart was scribed for [name] Gaskins, MD by Tonye Royalty, ED Scribe. This patient was seen in room APA17/APA17 and the patient's care was started at 3:01 PM.    Chief Complaint  Patient presents with  . Near Syncope   Patient is a 64 y.o. male presenting with syncope. The history is provided by the patient. No language interpreter was used.  Loss of Consciousness Episode history:  Single Most recent episode:  Today Timing: once. Progression:  Partially resolved Chronicity:  Recurrent Context: sitting down   Witnessed: no   Relieved by:  None tried Worsened by:  Nothing tried Ineffective treatments:  None tried Associated symptoms: chest pain, headaches and shortness of breath   Associated symptoms: no fever, no nausea and no seizures   Risk factors: coronary artery disease     HPI Comments: [name] is a 64 y.o. male who presents to the Emergency Department complaining of syncope a few hours ago. He states he was feeling like he might pass out, then he sat down on 2 buckets and passed out, falling backwards and striking his head. He states a friend was at his house and he did not report any seizure activity. He reports pain to his posterior head and low back. He reports associated SOB, CP, LUQ pain with onset "some time ago", and diarrhea in the last few weeks. He states his defibrillator fired while here at ED; he states the last time it went off was several months ago. He states the device is a [employer], has had it for 7 years, and had battery changed in December. He reports another count of syncope a recently but no ICD firing. He states was woken by jerking in his legs a few nights ago. He states he smokes "but not much." He states his heart was fluttering prior to syncope, which he states is a chronic problem due to atrial fibrillation. He denies preceding headache. He uses Xarelto and states he also uses  Digoxin. He denies blood in stool, hematemesis, nausea, fever, or cough.   Past Medical History  Diagnosis Date  . Ischemic cardiomyopathy     cardiomyopathy ischemic  . Paroxysmal atrial fibrillation   . Renal calculi   . 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 Chronic systolic dysfunction of left ventricle   . Coronary artery disease   . Chronic pain     chronic left abdominal pain  . ICD (implantable cardioverter-defibrillator), single, in situ    Past Surgical History  Procedure Laterality Date  . Difibrillator insertion  12/31/06    SJM Current DR RF ICD implanted by Dr Graciela Husbands  . Cholecystectomy    . Cardioversion N/A 12/10/2012    Procedure: CARDIOVERSION;  Surgeon: Marinus Maw, MD;  Location: [facility] CATH LAB;  Service: Cardiovascular;  Laterality: N/A;  . Implantable cardioverter defibrillator (icd) generator change N/A 07/03/2014    Procedure: ICD GENERATOR CHANGE;  Surgeon: Marinus Maw, MD;  Location: [facility] CATH LAB;  Service: Cardiovascular;  Laterality: N/A;   Family History  Problem Relation Age of Onset  . Coronary artery disease Other     family history of CAD  . Cancer Mother    History  Substance Use Topics  . Smoking status: Current Some Day Smoker -- 0.50 packs/day for 40 years    Types: Cigarettes  . Smokeless tobacco: Current  User  . Alcohol Use: No    Review of Systems  Constitutional: Negative for fever.  Respiratory: Positive for shortness of breath. Negative for cough.   Cardiovascular: Positive for chest pain, syncope and near-syncope.  Gastrointestinal: Positive for abdominal pain and diarrhea. Negative for nausea and blood in stool.  Musculoskeletal: Positive for back pain.  Neurological: Positive for syncope and headaches. Negative for seizures.  All other systems reviewed and are negative.     Allergies  Review of patient's allergies indicates no known allergies.  Home Medications   Prior to Admission  medications   Medication Sig Start Date End Date Taking? Authorizing Provider  atorvastatin (LIPITOR) 40 MG tablet Take 1 tablet (40 mg total) by mouth daily. 06/08/14   Aundria RudAli B Cosgrove, NP  B Complex-C (B-COMPLEX WITH VITAMIN C) tablet Take 1 tablet by mouth daily.    Historical Provider, MD  budesonide-formoterol (SYMBICORT) 160-4.5 MCG/ACT inhaler Inhale 2 puffs into the lungs 2 (two) times daily.    Historical Provider, MD  ciprofloxacin (CIPRO) 500 MG tablet Take 500 mg by mouth 2 (two) times daily.    Historical Provider, MD  colchicine 0.6 MG tablet Take 0.6 mg by mouth 2 (two) times daily as needed (for gout).     Historical Provider, MD  digoxin (LANOXIN) 0.125 MG tablet Take 1 tablet (0.125 mg total) by mouth daily. 11/11/13   Marinus MawGregg W Taylor, MD  gabapentin (NEURONTIN) 300 MG capsule Take 300 mg by mouth at bedtime.    Historical Provider, MD  HYDROcodone-acetaminophen (NORCO/VICODIN) 5-325 MG per tablet Take 1 tablet by mouth 2 (two) times daily as needed for moderate pain.    Historical Provider, MD  levalbuterol [facility](XOPENEX HFA) 45 MCG/ACT inhaler Inhale 1-2 puffs into the lungs every 6 (six) hours as needed for wheezing. Patient taking differently: Inhale 2 puffs into the lungs every 4 (four) hours as needed for wheezing.  06/02/14   Calvert CantorSaima Rizwan, MD  losartan (COZAAR) 50 MG tablet Take 1 tablet (50 mg total) by mouth daily. 06/08/14   Aundria RudAli B Cosgrove, NP  metoprolol succinate (TOPROL-XL) 25 MG 24 hr tablet Take 1 tablet (25 mg total) by mouth daily. 06/02/14   Calvert CantorSaima Rizwan, MD  nitroGLYCERIN (NITROSTAT) 0.4 MG SL tablet Place 1 tablet (0.4 mg total) under the tongue every 5 (five) minutes as needed for chest pain. 08/02/13   Marinus MawGregg W Taylor, MD  omeprazole (PRILOSEC) 20 MG capsule Take 20 mg by mouth daily.    Historical Provider, MD  rivaroxaban (XARELTO) 20 MG TABS tablet Take 1 tablet (20 mg total) by mouth daily. 03/31/14   Marinus MawGregg W Taylor, MD  torsemide (DEMADEX) 20 MG tablet Take 1 tablet (20  mg total) by mouth 2 (two) times daily. 08/28/14   Marinus MawGregg W Taylor, MD   BP 94/74 mmHg  Pulse 108  Temp(Src) 98.1 F (36.7 C) (Oral)  Resp 22  SpO2 100% Physical Exam  Nursing note and vitals reviewed. CONSTITUTIONAL: Well developed/well nourished HEAD: Normocephalic/atraumatic, no stepoff or abrasion noted EYES: EOMI/PERRL ENMT: Mucous membranes moist NECK: supple no meningeal signs SPINE/BACK:entire spine nontender CV: tachycardic LUNGS:coarse wheezing bilaterally ABDOMEN: soft, nontender, no rebound or guarding, bowel sounds noted throughout abdomen GU:no cva tenderness NEURO: Pt is awake/alert/appropriate, moves all extremitiesx4.  No facial droop.   EXTREMITIES: pulses normal/equal, full ROM SKIN: warm, color normal PSYCH: no abnormalities of mood noted, alert and oriented to situation   ED Course  Procedures  CRITICAL CARE Performed by: [name] GaskinsWICKLINE,Kymoni Lesperance W Total  critical care time: 37 Critical care time was exclusive of separately billable procedures and treating other patients. Critical care was necessary to treat or prevent imminent or life-threatening deterioration. Critical care was time spent personally by me on the following activities: development of treatment plan with patient and/or surrogate as well as nursing, discussions with consultants, evaluation of patient's response to treatment, examination of patient, obtaining history from patient or surrogate, ordering and performing treatments and interventions, ordering and review of laboratory studies, ordering and review of radiographic studies, pulse oximetry and re-evaluation of patient's condition. PATIENT WITH CHF AND PNEUMONIA RESPIRATORY RATE>40 DURING ED STAY HE HAS REQUIRED OXYGEN HE HAS REQUIRED NEBULIZERS HE HAS REQUIRED IV ANTIBIOTICS  DIAGNOSTIC STUDIES: Oxygen Saturation is 94% on room air, adequate by my interpretation.    COORDINATION OF CARE: 3:10 PM Discussed treatment plan with patient at beside.  Discussed with patient, will defer CT of head as only mild headache, no vomiting. The patient agrees with the plan and has no further questions at this time. Will interrogate ICD here in the ED Will follow closely 5:30 PM D/w dr Benedict Needy We discussed labs/imaging and ICD firing If stable at this time, can be managed at [facility] potassium correction He would like call back when interrogation report available Will also treat for pneumonia 6:29 PM UNABLE TO OBTAIN INTERROGATION ST JUDE REP MAY NEED TO EVALUATE PATIENT PT WILL BE ADMITTED D/W DR Karilyn Cota, WILL ADMIT TO TELE  PT WITHOUT FEVER AND HR RATE IMPROVED.  Lactate less than 3 Will defer iv fluids as pt with CHF   Labs Review Labs Reviewed  BASIC METABOLIC PANEL - Abnormal; Notable for the following:    Potassium 2.8 (*)    Chloride 95 (*)    CO2 39 (*)    Glucose, Bld 122 (*)    GFR calc non Af Amer 86 (*)    All other components within normal limits  CBC WITH DIFFERENTIAL/PLATELET - Abnormal; Notable for the following:    WBC 10.7 (*)    Eosinophils Relative 7 (*)    Eosinophils Absolute 0.8 (*)    All other components within normal limits  DIGOXIN LEVEL - Abnormal; Notable for the following:    Digoxin Level 0.6 (*)    All other components within normal limits  I-STAT CG4 LACTIC ACID, ED - Abnormal; Notable for the following:    Lactic Acid, Venous 2.66 (*)    All other components within normal limits  CULTURE, BLOOD (ROUTINE X 2)  CULTURE, BLOOD (ROUTINE X 2)  BRAIN NATRIURETIC PEPTIDE  I-STAT TROPOININ, ED     EKG Interpretation   Date/Time:  Thursday September 14 2014 14:59:11 EST Ventricular Rate:  120 PR Interval:  56 QRS Duration: 131 QT Interval:  367 QTC Calculation: 519 R Axis:   0 Text Interpretation:  Sinus tachycardia Ventricular tachycardia,  unsustained Probable left atrial enlargement Nonspecific intraventricular  conduction delay Confirmed by Bebe Shaggy  MD, Eriyah Fernando ([id number]) on  09/14/2014  3:06:44 PM     Medications  levofloxacin (LEVAQUIN) IVPB 750 mg (750 mg Intravenous New Bag/Given 09/14/14 1744)  potassium chloride 10 mEq in 100 mL IVPB (not administered)  potassium chloride SA (K-DUR,KLOR-CON) CR tablet 40 mEq (40 mEq Oral Given 09/14/14 1633)  ipratropium-albuterol (DUONEB) 0.5-2.5 (3) MG/3ML nebulizer solution 3 mL (3 mLs Nebulization Given 09/14/14 1701)    MDM   Final diagnoses:  Syncope, unspecified syncope type  Hypokalemia  CAP (community acquired pneumonia)  Acute congestive heart failure,  unspecified congestive heart failure type    Nursing notes including past medical history and social history reviewed and considered in documentation Labs/vital reviewed myself and considered during evaluation Previous records reviewed and considered    I personally performed the services described in this documentation, which was scribed in my presence. The recorded information has been reviewed and is accurate.      [name] Gaskins, MD 09/14/14 [phone]

## 2014-09-15 ENCOUNTER — Encounter: Payer: Self-pay | Admitting: Internal Medicine

## 2014-09-15 ENCOUNTER — Encounter (HOSPITAL_COMMUNITY): Payer: Self-pay | Admitting: Adult Health

## 2014-09-15 DIAGNOSIS — I472 Ventricular tachycardia: Secondary | ICD-10-CM

## 2014-09-15 DIAGNOSIS — I482 Chronic atrial fibrillation: Secondary | ICD-10-CM

## 2014-09-15 DIAGNOSIS — I5043 Acute on chronic combined systolic (congestive) and diastolic (congestive) heart failure: Secondary | ICD-10-CM

## 2014-09-15 DIAGNOSIS — J90 Pleural effusion, not elsewhere classified: Secondary | ICD-10-CM

## 2014-09-15 DIAGNOSIS — I255 Ischemic cardiomyopathy: Secondary | ICD-10-CM

## 2014-09-15 DIAGNOSIS — I2510 Atherosclerotic heart disease of native coronary artery without angina pectoris: Secondary | ICD-10-CM

## 2014-09-15 DIAGNOSIS — E876 Hypokalemia: Secondary | ICD-10-CM

## 2014-09-15 DIAGNOSIS — Z95810 Presence of automatic (implantable) cardiac defibrillator: Secondary | ICD-10-CM

## 2014-09-15 DIAGNOSIS — J189 Pneumonia, unspecified organism: Secondary | ICD-10-CM

## 2014-09-15 DIAGNOSIS — I509 Heart failure, unspecified: Secondary | ICD-10-CM

## 2014-09-15 DIAGNOSIS — R55 Syncope and collapse: Secondary | ICD-10-CM

## 2014-09-15 LAB — COMPREHENSIVE METABOLIC PANEL
ALBUMIN: 3.8 g/dL (ref 3.5–5.2)
ALT: 20 U/L (ref 0–53)
AST: 23 U/L (ref 0–37)
Alkaline Phosphatase: 79 U/L (ref 39–117)
Anion gap: 7 (ref 5–15)
BUN: 13 mg/dL (ref 6–23)
CALCIUM: 9 mg/dL (ref 8.4–10.5)
CHLORIDE: 95 mmol/L — AB (ref 96–112)
CO2: 40 mmol/L (ref 19–32)
Creatinine, Ser: 0.84 mg/dL (ref 0.50–1.35)
GFR calc Af Amer: >90 mL/min (ref >90)
GFR calc non Af Amer: 90 mL/min — ABNORMAL LOW (ref >90)
Glucose, Bld: 108 mg/dL — ABNORMAL HIGH (ref 70–99)
POTASSIUM: 3.6 mmol/L (ref 3.5–5.1)
SODIUM: 142 mmol/L (ref 135–145)
TOTAL PROTEIN: 7.2 g/dL (ref 6.0–8.3)
Total Bilirubin: 0.9 mg/dL (ref 0.3–1.2)

## 2014-09-15 LAB — CBC
HCT: 45.5 % (ref 39.0–52.0)
Hemoglobin: 14.5 g/dL (ref 13.0–17.0)
MCH: 31.8 pg (ref 26.0–34.0)
MCHC: 31.9 g/dL (ref 30.0–36.0)
MCV: 99.8 fL (ref 78.0–100.0)
Platelets: 200 10*3/uL (ref 150–400)
RBC: 4.56 MIL/uL (ref 4.22–5.81)
RDW: 13.9 % (ref 11.5–15.5)
WBC: 10.5 10*3/uL (ref 4.0–10.5)

## 2014-09-15 LAB — TROPONIN I
Troponin I: 0.07 ng/mL — ABNORMAL HIGH (ref <0.031)
Troponin I: 0.06 ng/mL — ABNORMAL HIGH (ref <0.031)

## 2014-09-15 LAB — PACEMAKER DEVICE OBSERVATION

## 2014-09-15 LAB — MAGNESIUM: Magnesium: 1.8 mg/dL (ref 1.5–2.5)

## 2014-09-15 MED ORDER — LEVALBUTEROL HCL 0.63 MG/3ML IN NEBU
0.6300 mg | INHALATION_SOLUTION | Freq: Four times a day (QID) | RESPIRATORY_TRACT | Status: DC | PRN
  Administered 2014-09-15: 0.63 mg via RESPIRATORY_TRACT
  Filled 2014-09-15 (×2): qty 3

## 2014-09-15 MED ORDER — BUDESONIDE-FORMOTEROL FUMARATE 160-4.5 MCG/ACT IN AERO
2.0000 | INHALATION_SPRAY | Freq: Two times a day (BID) | RESPIRATORY_TRACT | Status: DC
  Administered 2014-09-15 – 2014-09-19 (×3): 2 via RESPIRATORY_TRACT
  Filled 2014-09-15: qty 6

## 2014-09-15 MED ORDER — LEVALBUTEROL HCL 0.63 MG/3ML IN NEBU
0.6300 mg | INHALATION_SOLUTION | RESPIRATORY_TRACT | Status: DC
  Administered 2014-09-15 – 2014-09-16 (×3): 0.63 mg via RESPIRATORY_TRACT
  Filled 2014-09-15 (×2): qty 3

## 2014-09-15 MED ORDER — LORAZEPAM 2 MG/ML IJ SOLN
1.0000 mg | Freq: Every evening | INTRAMUSCULAR | Status: DC | PRN
  Administered 2014-09-15 – 2014-09-17 (×3): 1 mg via INTRAVENOUS
  Filled 2014-09-15 (×3): qty 1

## 2014-09-15 MED ORDER — POTASSIUM CHLORIDE 20 MEQ PO PACK
40.0000 meq | PACK | Freq: Once | ORAL | Status: AC
  Administered 2014-09-15: 40 meq via ORAL
  Filled 2014-09-15: qty 2

## 2014-09-15 MED ORDER — IPRATROPIUM BROMIDE 0.02 % IN SOLN: 0.5000 mg | Freq: Four times a day (QID) | RESPIRATORY_TRACT | Status: DC

## 2014-09-15 MED ORDER — LEVOFLOXACIN IN D5W 750 MG/150ML IV SOLN
750.0000 mg | INTRAVENOUS | Status: DC
  Administered 2014-09-15 – 2014-09-20 (×6): 750 mg via INTRAVENOUS
  Filled 2014-09-15 (×6): qty 150

## 2014-09-15 MED ORDER — MAGNESIUM OXIDE 400 (241.3 MG) MG PO TABS
400.0000 mg | ORAL_TABLET | Freq: Once | ORAL | Status: AC
  Administered 2014-09-15: 400 mg via ORAL
  Filled 2014-09-15: qty 1

## 2014-09-15 MED ORDER — IPRATROPIUM-ALBUTEROL 0.5-2.5 (3) MG/3ML IN SOLN: 3.0000 mL | RESPIRATORY_TRACT | Status: DC | PRN

## 2014-09-15 MED ORDER — LEVALBUTEROL HCL 0.63 MG/3ML IN NEBU: 0.6300 mg | INHALATION_SOLUTION | Freq: Four times a day (QID) | RESPIRATORY_TRACT | Status: DC

## 2014-09-15 MED ORDER — LEVALBUTEROL TARTRATE 45 MCG/ACT IN AERO: 1.0000 | INHALATION_SPRAY | Freq: Four times a day (QID) | RESPIRATORY_TRACT | Status: DC | PRN

## 2014-09-15 MED ORDER — METOPROLOL SUCCINATE ER 25 MG PO TB24
12.5000 mg | ORAL_TABLET | Freq: Every evening | ORAL | Status: DC
  Administered 2014-09-15 – 2014-09-18 (×3): 12.5 mg via ORAL
  Filled 2014-09-15 (×4): qty 1

## 2014-09-15 NOTE — Progress Notes (Signed)
CRITICAL VALUE ALERT  Critical value received: CO2 - 40  Date of notification:  09/15/14  Time of notification:  0728  Critical value read back:Yes.    Nurse who received alert:  Zara Chessrystal Kagan Mutchler, RN  MD notified (1st page):  Elisabeth Pigeonevine   Time of first page:  [phone]  MD notified (2nd page):  Time of second page:  Responding MD:  Elisabeth Pigeonevine  Time MD responded:  [phone]

## 2014-09-15 NOTE — Progress Notes (Signed)
[email or web address] MD & RT notified of lab result CO2-40

## 2014-09-15 NOTE — Evaluation (Signed)
Physical Therapy Evaluation Patient Details Name: [name] MRN: [id number] DOB: [birth year] Today's Date: 09/15/2014   History of Present Illness  This is a 64 year old man who had an episode today at noon of syncope. He felt generally unwell before he loss consciousness. He thinks he was unconscious for what appeared to be 5-10 minutes but he is not quite sure of this. When he regained consciousness, he was quite aware of his surroundings. There was no apparent seizure activity seen by a friend that was at his house. He did admit to fluttering of his heart prior to the syncope, but he says this is a chronic problem as he has atrial fibrillation. In the emergency room after he came, he says that his defibrillator fired. He has a history of ischemic cardiomyopathy with significantly reduced ejection fraction in the 15-20% range. He denies excessive dyspnea although he has been short of breath. There is no chest pain. There is no fever. There is no cough that is productive. In fact he has very minimal cough. Evaluation in the emergency room by chest x-ray suggestive of a possible pneumonia. He is now being admitted for further management.  Clinical Impression   Pt was seen for evaluation and was alert, oriented and very cooperative.  He was on 3 L O2 and is O2 dependent at home.  Pt states that his son lives with him but is moving out in the near future.  He is worried about being alone as he sometimes becomes disoriented and, of course, he has been blacking out.  I suggested that he consider ACLF but he is adamantly opposed to this.  He will try to have another one of his children assist him at home.  The evalution reveals pt to be at functional baseline.  He is independent with transfers, balance is WNL and his gait is stable with a walker for functional distances.  His mobility is limited primarily by difficulty in breathing.  He understands energy conservation.  There are no PT needs at this  time.    Follow Up Recommendations No PT follow up    Equipment Recommendations  None recommended by PT    Recommendations for Other Services   none    Precautions / Restrictions Precautions Precautions: None Restrictions Weight Bearing Restrictions: No      Mobility  Bed Mobility Overal bed mobility: Independent                Transfers Overall transfer level: Independent                  Ambulation/Gait Ambulation/Gait assistance: Independent Ambulation Distance (Feet): 100 Feet Assistive device: Rolling walker (2 wheeled) Gait Pattern/deviations: WFL(Within Functional Limits) Gait velocity: appropriate for situation Gait velocity interpretation: Below normal speed for age/gender General Gait Details: pt on 3 L O2 becomes dyspneic with gait but O2 sat= 94%  Stairs            Wheelchair Mobility    Modified Rankin (Stroke Patients Only)       Balance Overall balance assessment: No apparent balance deficits (not formally assessed) (pt reports he has fallen only when blacking out)                                           Pertinent Vitals/Pain Pain Assessment: No/denies pain    Home Living Family/patient expects to be  discharged to:: Private residence Living Arrangements: Children Available Help at Discharge: Family Type of Home: House Home Access: Ramped entrance     Home Layout: One level Home Equipment: Environmental consultantWalker - 2 wheels      Prior Function Level of Independence: Independent with assistive device(s)         Comments: pt used a walker some of the time to decrease exertional level     Hand Dominance        Extremity/Trunk Assessment   Upper Extremity Assessment: Overall WFL for tasks assessed           Lower Extremity Assessment: Overall WFL for tasks assessed         Communication   Communication: No difficulties  Cognition Arousal/Alertness: Awake/alert Behavior During Therapy: WFL for  tasks assessed/performed Overall Cognitive Status: Within Functional Limits for tasks assessed                      General Comments      Exercises        Assessment/Plan    PT Assessment Patent does not need any further PT services  PT Diagnosis     PT Problem List    PT Treatment Interventions     PT Goals (Current goals can be found in the Care Plan section) Acute Rehab PT Goals PT Goal Formulation: All assessment and education complete, DC therapy    Frequency     Barriers to discharge  no clear cut assist at home      Co-evaluation               End of Session Equipment Utilized During Treatment: Gait belt;Oxygen Activity Tolerance: Patient tolerated treatment well Patient left: in chair;with call bell/phone within reach           Time: 1413-1445 PT Time Calculation (min) (ACUTE ONLY): 32 min   Charges:   PT Evaluation $Initial PT Evaluation Tier I: 1 Procedure     PT G CodesMyrlene [name]:        [name] L 09/15/2014, 2:51 PM

## 2014-09-15 NOTE — Consult Note (Addendum)
CARDIOLOGY CONSULT NOTE   Patient ID: [name] LemonsReuben S [name] MRN: [id number] DOB/AGE: 1951-07-09 64 y.o.  Admit Date: 09/14/2014 Referring Physician: PTH Primary Physician: [name],ASHISH, MD Consulting Cardiologist: Prentice DockerKoneswaran, Larayne Baxley Md Primary Cardiologist:Benismhon, Reuel Boomaniel MD EP: Lewayne Buntingaylor, Gregg MD Reason for Consultation: Syncope  Clinical Summary [name] is a 64 y.o.male with known history of ICM, CAD with stents to the LAD, CX and RCA. EF of  20% with St. Jude ICD (implanted 07/03/2014 after removal of old St. Jude), PAF, admitted with worsening dyspnea and syncopal episode. He states that he has passed out 6-8 times over the last 3 weeks. He has felt his ICD fire 3 times, most recently yesterday while sitting on his porch. He states he "feels it coming on, fells funny, and passes out or feels ICD hit me."  He denies medical non-compliance.         In ER BP 104/75, 49, O2 94%,. Lactic acid of 2.66, potassium 2.8, Creatinine 0.94. H/H 14/7/44.3.  Digoxin 0.6, Pro-BNP 335. Troponin 0.04. CXR demonstrated bilateral pleural effusions, right greater than left, compressive atelectasis, posterior LEFT upper lobe nodule measuring 16 X 13 mm, larger than previous finding. Malignancy is suspected.  He was treated with potassium replacement, levofloxacin, vancomycin, and neb tx. EKG NSR with PAC';s and PVC;s    He is currently complaining of being tired, sore from ICD discharges, back soreness from fall and has a headache from bumping his head.   No Known Allergies  Medications Scheduled Medications: . atorvastatin  40 mg Oral q1800  . B-complex with vitamin C  1 tablet Oral Daily  . budesonide-formoterol  2 puff Inhalation BID  . digoxin  0.125 mg Oral Daily  . gabapentin  300 mg Oral QHS  . levofloxacin (LEVAQUIN) IV  750 mg Intravenous Q24H  . losartan  25 mg Oral Daily  . metoprolol succinate  25 mg Oral Daily  . pantoprazole  40 mg Oral Daily  . rivaroxaban  20 mg Oral Daily  .  torsemide  20 mg Oral BID      PRN Medications: colchicine, HYDROcodone-acetaminophen, ipratropium-albuterol, nitroGLYCERIN, ondansetron **OR** ondansetron (ZOFRAN) IV   Past Medical History  Diagnosis Date  . Ischemic cardiomyopathy     cardiomyopathy ischemic  . Paroxysmal atrial fibrillation   . Renal calculi   . 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 Chronic systolic dysfunction of left ventricle   . Coronary artery disease   . Chronic pain     chronic left abdominal pain  . 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 Difibrillator insertion  12/31/06    S[facility] Current DR RF ICD implanted by Dr Graciela HusbandsKlein  . Cholecystectomy    . Cardioversion N/A 12/10/2012    Procedure: CARDIOVERSION;  Surgeon: Marinus MawGregg W Taylor, MD;  [facility];  Service: Cardiovascular;  Laterality: N/A;  . Implantable cardioverter defibrillator (icd) generator change N/A 07/03/2014    Procedure: ICD GENERATOR CHANGE;  Surgeon: Marinus MawGregg W Taylor, MD;  [facility];  Service: Cardiovascular;  Laterality: N/A;    Family History  Problem Relation Age of Onset  . Coronary artery disease Other     family history of CAD  . Cancer Mother     Social History [name] reports that he has been smoking Cigarettes.  He has a 20 pack-year smoking history. He uses smokeless tobacco. [name] reports that  he does not drink alcohol.  Review of Systems Complete review of systems are found to be negative unless outlined in H&P above.  Physical Examination Blood pressure 119/66, pulse 95, temperature 98.5 F (36.9 C), temperature source Oral, resp. rate 20, height  (1.702 m), SpO2 91 %.  Intake/Output Summary (Last 24 hours) at 09/15/14 1211 Last data filed at 09/15/14 0751  Gross per 24 hour  Intake    100 ml  Output   1050 ml  Net   -950 ml     GEN: No acute distress HEENT:  Conjunctiva and lids normal, oropharynx clear with moist mucosa. Neck: Supple, no elevated JVP or carotid bruits, no thyromegaly. Lungs:Bilateral crackles with inspiratory wheezes, cleared with forced coughing. Cardiac: Regular rate and rhythm, distant heart sounds, no S3 or significant systolic murmur, no pericardial rub. Abdomen: Soft, nontender, no hepatomegaly, bowel sounds present, no guarding or rebound. Extremities: No pitting edema, distal pulses 2+. Skin: Warm and dry. Musculoskeletal: No kyphosis. Neuropsychiatric: Alert and oriented x3, affect grossly appropriate.  Prior Cardiac Testing/Procedures 1.Cardiac Cath 11/27/2006 IMPRESSION: 1. Moderately severe reduction in left ventricular function as noted  previously. 2. Continued patencies of the LAD, circumflex and right coronary  artery stents. 3. Ostial disease of moderate severity involving both the LAD and  right coronary arteries. 4. Probable subtotal occlusion of a small AV circumflex with right-to-  left collaterals.  DISPOSITION: At the present time the patient likely will need to be treated medically.  Echocardiogram 08/21/2013 Mildly dilated LV with normal wall thicknessand LVEF 20-25%, wall motion abnormalities consistent with ischemic cardiomyopathy. Grade2 diastolic dysfunction. Abnormal septal motion. Moderate left atrial enlargement. Device wire in the right heart. Trivial tricuspid regurgitation with PASP 29 mmHg. Lab Results  Basic Metabolic Panel:  Recent Labs Lab 09/14/14 1513 09/15/14 0620  NA 139 142  K 2.8* 3.6  CL 95* 95*  CO2 39* 40*  GLUCOSE 122* 108*  BUN 13 13  CREATININE 0.94 0.84  CALCIUM 8.9 9.0    Liver Function Tests:  Recent Labs Lab 09/15/14 0620  AST 23  ALT 20  ALKPHOS 79  BILITOT 0.9  PROT 7.2  ALBUMIN 3.8    CBC:  Recent Labs Lab 09/14/14 1513 09/15/14 0620  WBC 10.7* 10.5  NEUTROABS 7.7  --   HGB 14.7 14.5  HCT 44.3  45.5  MCV 96.9 99.8  PLT 204 200    Cardiac Enzymes:  Recent Labs Lab 09/14/14 1513 09/15/14 0057 09/15/14 0620  TROPONINI 0.04* 0.07* 0.06*    Radiology: Ct Chest W Contrast  09/14/2014   CLINICAL DATA:  Pneumonia, pleural effusion  EXAM: CT CHEST WITH CONTRAST  TECHNIQUE: Multidetector CT imaging of the chest was performed during intravenous contrast administration. Sagittal and coronal MPR images reconstructed from axial data set.  CONTRAST:  80mL OMNIPAQUE IOHEXOL 300 MG/ML  SOLN IV  COMPARISON:  Chest radiograph 09/14/2014, CT chest 05/24/2014  FINDINGS: Pacemaker leads via RIGHT subclavian approach located within RIGHT atrium and RIGHT ventricle.  Atherosclerotic calcifications aorta and coronary arteries.  Scattered normal sized mediastinal lymph nodes.  Tiny nonspecific low-attenuation foci superior aspect of liver, 1 of which represents a small cyst 7 mm diameter, additional which are indeterminate.  Cyst at upper pole LEFT kidney 4.2 x 3.9 cm.  Remaining visualized upper abdomen unremarkable.  BILATERAL pleural effusions, small LEFT and moderate RIGHT.  No pericardial effusion.  Nodule at posterior aspect of LEFT upper lobe 16 x 13 mm previously 13 x 11 mm.  Mild compressive atelectasis of both lower lobes.  Atelectasis at anterior aspect of RIGHT middle lobe base.  No acute infiltrate, pneumothorax, or acute osseous findings.  Small focus of scarring laterally in the RIGHT upper lobe this grossly stable.  IMPRESSION: BILATERAL pleural effusions RIGHT greater than LEFT little changed with compressive atelectasis of the lower lobes.  Subsegmental atelectasis at anterior RIGHT middle lobe base.  Increase in size of a posterior LEFT upper lobe nodule measuring 16 x 13 mm previously 13 x 11 mm; malignancy is not excluded and tissue diagnosis is recommended.   Electronically Signed   By: Ulyses Southward M.D.   On: 09/14/2014 19:44   Dg Chest Portable 1 View  09/14/2014   CLINICAL DATA:   Shortness of breath  EXAM: PORTABLE CHEST - 1 VIEW  COMPARISON:  06/28/2014  FINDINGS: There is cardiomegaly and pulmonary venous congestion. Dense opacity at the right base, which appears more like airspace opacities than pleural effusion. No left-sided pleural effusion or pneumothorax. There is an nodular density overlapping the left anterior second rib which is a known nodule from chest CT 05/24/2014.  Dual chamber pacer leads from the right are in stable position.  IMPRESSION: 1. CHF. 2. Right basilar opacity which could reflect pneumonia or pleural fluid with atelectasis. Suggest two-view imaging when feasible. 3. Left upper lobe pulmonary nodule, reference chest CT 05/24/2014.   Electronically Signed   By: Marnee Spring M.D.   On: 09/14/2014 16:33     ECG: NSR with PAC's PVC's 1st degree AV block.   Impression and Recommendations  1. ICM with ICD in situ:  St. Jude was interrogated in the ER and was documented to have 3 ICD discharges over the last 3 weeks. He is on digoxin, metoprolol, ARB, but not amiodarone. Hypokalemia may have precipitated VT causing ICD discharge. He is on torsemide but no potassium replacement on review of home mediations. Not on spironolactone for potassium sparing.   He will need to have potassium replacement daily to keep level at 4.0 with CAD and ICM. Consider adding spironolactone at a later date if necessary. Will check Mg as he is on PPI. Marland Kitchen He is followed in the CHF clinic by Dr. Gala Romney and Shirlee Latch.  2. CAD: Most recent cath in 2008, LAD, CX and RCA stents were patent. Continue BB, statin therapy.  3. PAF: He is on NOAC-Xarelto daily 20 mg at home. It has been restarted on admission. No complaints of bleeding.  4. Ongoing tobacco abuse: Immediate cessation is recommended. Especially in lieu of abnormal CXR. Will need further evaluation as OP.   Signed: Bettey Mare. Lawrence NP [facility]  09/15/2014, 12:11 PM Co-Sign MD  The patient was seen and examined, and  I agree with the assessment and plan as documented above, with modifications as noted below. Pt with aforementioned history (history of CAD s/p multiple stents, ischemic cardiomyopathy/chronic systolic heart failure, LVEF 20-25%, s/p ICD, chronic atrial fibrillation on Xarelto, continued tobacco abuse, and COPD). He has refractory atrial fibrillation and failed multiple DCCV in the past, despite treatment with amiodarone. Last coronary angiogram in 2008 which showed moderately severe left ventricular dysfunction with elevated LVEDP and wall motion abnormalities, continued patency of the LAD stent with moderate ostial LAD disease, although not critical, continued patency of the circumflex stent with subtotal occlusion of the AV circumflex which is small, and mild to moderate ostial narrowing of the RCA with continued patency of the mid vessel stent.  Presented with weakness and syncope.  Denies antecedent chest pain. Found to have acute systolic CHF with bilateral pleural effusions and increase in size of LUL lung nodule, and as per pulmonary will need outpatient PET scan. I reviewed the device interrogation and ICD firing is consistent with ventricular tachycardia. However, K was low at 2.8 as noted above. Mg low normal at 1.8. Also hypotensive.  Recommendations: Replace KCl to keep K>4 (I will given another 40 meq now) and Mg>2 (will give 400 mg now as well). If he were to have a recurrence of VT requiring ICD shocks, would transfer to ICU and load with IV amiodarone. I discussed my management strategy with Dr. Lewayne Bunting, his electrophysiologist, and he is in agreement with this plan. Given his known CAD with moderate ostial LAD and RCA disease seen in 2008, he warrants an ischemic evaluation at some point in the future (nuclear MPI study) to definitively exclude ischemia as a potential etiology for VT, once hypokalemia has effectively been treated. Has already put out nearly 1 liter with oral  torsemide so would continue for time being. Can give IV Lasix as needed with close BP monitoring. I will d/c losartan and increase Toprol-XL to 25 mg q am and 12.5 mg q pm so as not to precipitate worsening hypotension and allowing for more beta blockade to attenuate tachycardia. I am on call this weekend at [facility] as is Dr. Ladona Ridgel and if our assistance is required, please do not hesitate to contact either one of [place].

## 2014-09-15 NOTE — Consult Note (Signed)
Full note dictated. He will need outpatient PET scan then biopsy depending on the results.  He is not a great candidate for rx considering severe COPD and multiple co-morbidities.

## 2014-09-15 NOTE — Care Management Note (Addendum)
    Page 1 of 2   09/21/2014     3:19:11 PM CARE MANAGEMENT NOTE 09/21/2014  Patient:  [name],[name]   Account Number:  [id number]  Date Initiated:  09/15/2014  Documentation initiated by:  Sharrie RothmanBLACKWELL,TAMMY C  Subjective/Objective Assessment:   Pt admitted from home with pneumonia and syncope. Pt lives alone and has children that assist pt as needed. Pt has home O2 with [facility] and neb machine. Pt has a walker as well.     Action/Plan:   Pt undecided about HH. If pt changes his mind weekend staff can arrange. No DME needs noted.   Anticipated DC Date:  09/17/2014   Anticipated DC Plan:  HOME/SELF CARE      DC Planning Services  CM consult      PAC Choice  HOSPICE   Choice offered to / List presented to:  C-4 Adult Children           Status of service:  Completed, signed off Medicare Important Message given?  YES (If response is "NO", the following Medicare IM given date fields will be blank) Date Medicare IM given:  09/15/2014 Medicare IM given by:  Arlyss QueenBLACKWELL,TAMMY C Date Additional Medicare IM given:  09/21/2014 Additional Medicare IM given by:  JESSICA CHILDRESS  [facility]  Per UR Regulation:  Reviewed for med. necessity/level of care/duration of stay  If discussed at Long Length of Stay Meetings, dates discussed:   09/19/2014  09/20/2014    Comments:  09/21/2014 1500 Kathyrn SheriffJessica Childress, RN, MSN, CM Pt'[name] family now wishing for Hospice. Family conferenced with Dr. [name]. Per families request [facility] and pt plans for transfer this evening. CSW arranging for assisting with admission to Hospice facility. No further CM needs at this time. 09/20/2014 1600 Kathyrn SheriffJessica Childress, RN, MSN, CM Pt appropriate for LTAC placement. Both [facility] asked to assess for approval. [facility] has given bed offer and family has accepted. Santa GeneraAndrea Pike with [facility] has visitied family in room. Pt is unable to make desicions.  MD to discharge to LTAC on 09/21/2014. 09/18/2014 1400 Kathyrn SheriffJessica Childress, RN, MSN, CM Pt transfered to stepdown unit 09/16/2014 and requiring BIPAP. 09/15/14 1505 Arlyss Queenammy Blackwell, RN BSN CM

## 2014-09-15 NOTE — Progress Notes (Addendum)
Patient ID: [name], male   DOB: Dec 04, 1950, 64 y.o.   MRN: [id number] [facility] PROGRESS NOTE  [name] OTL:[id number] DOB: [birth year] DOA: 09/14/2014 PCP: [name] Blitz, MD  Brief narrative:    64 y.o. male with past medical history of chronic systolic heart failure, status post AICD, last 2 D ECHO in 08/16/13 with EF of 25%, chronic atrial fibrillation on anticoagulation with xarelto, hypertension who presented to [facility] status post syncopal event prior to the admission. Patient reported feeling lightheaded prior to syncope. There was no report of other prodromal symptoms prior to syncope such as chest pain or shortness of breath or palpitations. On admission, blood pressure was 93/70, HR 41 - 109, RR 20, oxygen saturation 94% on room air. Blood work revealed mild leukocytosis of 10.7, potassium 2.8, troponin level 0.04, lactic acid 2.6, BNP 335. Chest x-ray showed CHF, right basilar opacity which could reflect pneumonia, left upper lobe pulmonary nodule. CT chest with contrast showed bilateral pleural effusion right greater than left, increased in size of posterior left upper lobe nodule measuring 16 x 13 mm and previously 13 x 11 mm, malignancy not entirely excluded. Patient was admitted for further evaluation of syncopal event. Cardiology was consulted. Pulmonary was consulted for possible biopsy of the pulmonary nodule.   Assessment/Plan:    Principal Problem: Syncope - Unclear etiology but obvious concern is cardiac etiology considering that patient has extensive cardiac history including systolic and diastolic heart failure with AICD, atrial fibrillation. Also, hypotension could potentially account for syncope. Blood pressure on admission was 93/70 but at this time it is 119/66. - Patient reports feeling low better this morning. - Cardiology consulted for input on management, appreciate their recommendations. - Appreciate physical therapy evaluation.  Active  Problems: Sepsis secondary to community acquired pneumonia / lactic acidosis / leukocytosis / acute respiratory failure with hypoxia - Sepsis criteria met on the admission with initial vitals signs that included hypotension, tachycardia, tachypnea, hypoxia (please note that oxygen saturation is 91% while patient is on a nasal cannula oxygen support). In addition patient had mild leukocytosis, 10.7, lactic acid elevated at 2.6. Evidence of pneumonia seen on chest x-ray. - Patient was started on Levaquin on the admission for treatment of possible pneumonia.  Ischemic cardiomyopathy / mild troponin elevation  - Mild troponin elevation seen on the admission and persists, 0.04 --> 0.07 --> 0.06 - Likely demand ischemia from possible sepsis  Chronic atrial fibrillation - CHADS2/vasc as he isular score 4 - on anticoagulation with xarelto - Continue digoxin 0.125 mg daily - Continue metoprolol 25 mg daily  Chronic combined systolic and diastolic heart failure / AICD (automatic cardioverter/defibrillator) present - 2-D echo in January 2015 showed ejection fraction of 25% with grade 1 diastolic dysfunction. - Appreciate cardiology consult and recommendation - Continue digoxin, losartan, metoprolol, torsemide - continue Lipitor 40 mg at bedtime - Daily weight and strict intake and output - Replete electrolytes as needed  Hypokalemia - due to torsemide - supplemented and potassium now WNL  Pulmonary nodule -  left upper lobe nodule measuring 16 x 13 mm and previously 13 x 11 mm, malignancy not entirely excluded - pulmonary consulted for possible biopsy    DVT Prophylaxis  - on anticoagulation with xarelto    Code Status: Full.  Family Communication:  plan of care discussed with the patient Disposition Plan: Patient requires physical therapy evaluation for safe discharge plan. He also needs cardiology and pulmonary evaluation.  IV access:  Peripheral IV  Procedures and diagnostic  studies:    Ct Chest W Contrast 09/14/2014  BILATERAL pleural effusions RIGHT greater than LEFT little changed with compressive atelectasis of the lower lobes.  Subsegmental atelectasis at anterior RIGHT middle lobe base.  Increase in size of a posterior LEFT upper lobe nodule measuring 16 x 13 mm previously 13 x 11 mm; malignancy is not excluded and tissue diagnosis is recommended.    Dg Chest Portable 1 View 09/14/2014    1. CHF. 2. Right basilar opacity which could reflect pneumonia or pleural fluid with atelectasis. Suggest two-view imaging when feasible. 3. Left upper lobe pulmonary nodule, reference chest CT 05/24/2014.    Medical Consultants:  Cardiology Pulmonary   Other Consultants:  None   IAnti-Infectives:   Levaquin 09/14/2014 -->    [name] Passey, MD  [facility] Pager [phone]  If 7PM-7AM, please contact night-coverage [email or web address] Password [facility] 09/15/2014, 10:11 AM   LOS: 1 day    HPI/Subjective: No acute overnight events.  Objective: Filed Vitals:   09/14/14 2113 09/15/14 0651 09/15/14 0807 09/15/14 0847  BP:  119/66    Pulse:  41  95  Temp:  98.5 F (36.9 C)    TempSrc:  Oral    Resp:  20    Height:      SpO2: 97% 98% 91%     Intake/Output Summary (Last 24 hours) at 09/15/14 1011 Last data filed at 09/15/14 0751  Gross per 24 hour  Intake    100 ml  Output   1050 ml  Net   -950 ml    Exam:   General:  Pt is alert, not in acute distress  Cardiovascular: Regular rate and rhythm, S1/S2 (+)  Respiratory: no wheezing, no crackles, no rhonchi  Abdomen: Soft, non tender, non distended, bowel sounds present  Extremities: No edema, pulses DP and PT palpable bilaterally  Neuro: Grossly nonfocal  Data Reviewed: Basic Metabolic Panel:  Recent Labs Lab 09/14/14 1513 09/15/14 0620  NA 139 142  K 2.8* 3.6  CL 95* 95*  CO2 39* 40*  GLUCOSE 122* 108*  BUN 13 13  CREATININE 0.94 0.84  CALCIUM 8.9 9.0   Liver Function  Tests:  Recent Labs Lab 09/15/14 0620  AST 23  ALT 20  ALKPHOS 79  BILITOT 0.9  PROT 7.2  ALBUMIN 3.8   No results for input(s): LIPASE, AMYLASE in the last 168 hours. No results for input(s): AMMONIA in the last 168 hours. CBC:  Recent Labs Lab 09/14/14 1513 09/15/14 0620  WBC 10.7* 10.5  NEUTROABS 7.7  --   HGB 14.7 14.5  HCT 44.3 45.5  MCV 96.9 99.8  PLT 204 200   Cardiac Enzymes:  Recent Labs Lab 09/14/14 1513 09/15/14 0057 09/15/14 0620  TROPONINI 0.04* 0.07* 0.06*   BNP: Invalid input(s): POCBNP CBG: No results for input(s): GLUCAP in the last 168 hours.  Recent Results (from the past 240 hour(s))  Blood culture (routine x 2)     Status: None (Preliminary result)   Collection Time: 09/14/14  3:13 PM  Result Value Ref Range Status   Specimen Description BLOOD LEFT ARM  Final   Special Requests BOTTLES DRAWN AEROBIC AND ANAEROBIC [facility] EACH  Final   Culture PENDING  Incomplete   Report Status PENDING  Incomplete  Blood culture (routine x 2)     Status: None (Preliminary result)   Collection Time: 09/14/14  5:40 PM  Result Value Ref Range Status   Specimen Description BLOOD RIGHT HAND  Final   Special Requests BOTTLES DRAWN AEROBIC AND ANAEROBIC 6CC  Final   Culture PENDING  Incomplete   Report Status PENDING  Incomplete     Scheduled Meds: . atorvastatin  40 mg Oral q1800  . B-complex with vitamin C  1 tablet Oral Daily  . digoxin  0.125 mg Oral Daily  . gabapentin  300 mg Oral QHS  . ipratropium  0.5 mg Nebulization Q6H  . levalbuterol  0.63 mg Nebulization Q6H  . losartan  25 mg Oral Daily  . metoprolol succinate  25 mg Oral Daily  . pantoprazole  40 mg Oral Daily  . rivaroxaban  20 mg Oral Daily  . torsemide  20 mg Oral BID   Continuous Infusions:

## 2014-09-15 NOTE — Progress Notes (Signed)
UR chart review completed.  

## 2014-09-15 NOTE — Progress Notes (Signed)
*  PRELIMINARY RESULTS* Echocardiogram 2D Echocardiogram has been performed.  Jeryl ColumbiaLLIOTT, Sondi Desch 09/15/2014, 1:56 PM

## 2014-09-16 ENCOUNTER — Inpatient Hospital Stay (HOSPITAL_COMMUNITY): Payer: Medicare Other

## 2014-09-16 DIAGNOSIS — A419 Sepsis, unspecified organism: Principal | ICD-10-CM

## 2014-09-16 LAB — CBC
HEMATOCRIT: 45.1 % (ref 39.0–52.0)
Hemoglobin: 14.1 g/dL (ref 13.0–17.0)
MCH: 31.7 pg (ref 26.0–34.0)
MCHC: 31.3 g/dL (ref 30.0–36.0)
MCV: 101.3 fL — ABNORMAL HIGH (ref 78.0–100.0)
Platelets: 185 10*3/uL (ref 150–400)
RBC: 4.45 MIL/uL (ref 4.22–5.81)
RDW: 13.9 % (ref 11.5–15.5)
WBC: 14.5 10*3/uL — ABNORMAL HIGH (ref 4.0–10.5)

## 2014-09-16 LAB — BASIC METABOLIC PANEL
ANION GAP: 7 (ref 5–15)
Anion gap: 4 — ABNORMAL LOW (ref 5–15)
BUN: 16 mg/dL (ref 6–23)
BUN: 16 mg/dL (ref 6–23)
CALCIUM: 9.3 mg/dL (ref 8.4–10.5)
CO2: 39 mmol/L — AB (ref 19–32)
CO2: 41 mmol/L (ref 19–32)
Calcium: 9 mg/dL (ref 8.4–10.5)
Chloride: 94 mmol/L — ABNORMAL LOW (ref 96–112)
Chloride: 94 mmol/L — ABNORMAL LOW (ref 96–112)
Creatinine, Ser: 1.03 mg/dL (ref 0.50–1.35)
Creatinine, Ser: 1.16 mg/dL (ref 0.50–1.35)
GFR calc Af Amer: 75 mL/min — ABNORMAL LOW (ref >90)
GFR calc Af Amer: 86 mL/min — ABNORMAL LOW (ref >90)
GFR calc non Af Amer: 64 mL/min — ABNORMAL LOW (ref >90)
GFR calc non Af Amer: 74 mL/min — ABNORMAL LOW (ref >90)
Glucose, Bld: 123 mg/dL — ABNORMAL HIGH (ref 70–99)
Glucose, Bld: 127 mg/dL — ABNORMAL HIGH (ref 70–99)
Potassium: 4.4 mmol/L (ref 3.5–5.1)
Potassium: 4.5 mmol/L (ref 3.5–5.1)
SODIUM: 140 mmol/L (ref 135–145)
Sodium: 139 mmol/L (ref 135–145)

## 2014-09-16 LAB — MRSA PCR SCREENING: MRSA BY PCR: NEGATIVE

## 2014-09-16 MED ORDER — LEVALBUTEROL HCL 0.63 MG/3ML IN NEBU
0.6300 mg | INHALATION_SOLUTION | RESPIRATORY_TRACT | Status: DC
  Administered 2014-09-16 – 2014-09-17 (×9): 0.63 mg via RESPIRATORY_TRACT
  Filled 2014-09-16 (×9): qty 3

## 2014-09-16 MED ORDER — IPRATROPIUM-ALBUTEROL 0.5-2.5 (3) MG/3ML IN SOLN
3.0000 mL | RESPIRATORY_TRACT | Status: DC
  Administered 2014-09-16: 3 mL via RESPIRATORY_TRACT
  Filled 2014-09-16: qty 3

## 2014-09-16 MED ORDER — IPRATROPIUM-ALBUTEROL 0.5-2.5 (3) MG/3ML IN SOLN: 3.0000 mL | RESPIRATORY_TRACT | Status: DC | PRN

## 2014-09-16 MED ORDER — IPRATROPIUM BROMIDE 0.02 % IN SOLN
0.5000 mg | RESPIRATORY_TRACT | Status: DC
  Administered 2014-09-16 – 2014-09-17 (×9): 0.5 mg via RESPIRATORY_TRACT
  Filled 2014-09-16 (×9): qty 2.5

## 2014-09-16 MED ORDER — METHYLPREDNISOLONE SODIUM SUCC 125 MG IJ SOLR
60.0000 mg | Freq: Three times a day (TID) | INTRAMUSCULAR | Status: DC
  Administered 2014-09-16 – 2014-09-17 (×4): 60 mg via INTRAVENOUS
  Filled 2014-09-16 (×4): qty 2

## 2014-09-16 MED ORDER — ENSURE COMPLETE PO LIQD
237.0000 mL | Freq: Two times a day (BID) | ORAL | Status: DC
  Administered 2014-09-16 – 2014-09-22 (×8): 237 mL via ORAL

## 2014-09-16 MED ORDER — HYDROMORPHONE HCL 1 MG/ML IJ SOLN
1.0000 mg | INTRAMUSCULAR | Status: DC | PRN
  Administered 2014-09-17 – 2014-09-22 (×11): 1 mg via INTRAVENOUS
  Filled 2014-09-16 (×11): qty 1

## 2014-09-16 MED ORDER — CETYLPYRIDINIUM CHLORIDE 0.05 % MT LIQD
7.0000 mL | Freq: Two times a day (BID) | OROMUCOSAL | Status: DC
  Administered 2014-09-16 – 2014-09-22 (×11): 7 mL via OROMUCOSAL

## 2014-09-16 MED ORDER — POTASSIUM CHLORIDE CRYS ER 20 MEQ PO TBCR: 20.0000 meq | EXTENDED_RELEASE_TABLET | Freq: Every day | ORAL | Status: DC

## 2014-09-16 NOTE — Progress Notes (Signed)
We have received another consult to see this pt.  He was evaluated on 09-15-14 and found to be at functional baseline.  His mobility is severely limited by poor respiratory function.  If there is a decline in strength or balance, we will be happy to see him again, but otherwise it would be ideal if the nursing service could mobilize him to his tolerance.  Please refer to my note of 09-15-14.  Thank you.

## 2014-09-16 NOTE — Progress Notes (Signed)
INITIAL NUTRITION ASSESSMENT  DOCUMENTATION CODES Per approved criteria  -Not Applicable   INTERVENTION: Ensure Complete po BID, each supplement provides 350 kcal and 13 grams of protein  NUTRITION DIAGNOSIS: Increased protein/energy needs related to current disease states as evidenced by sepsis in setting of PNA.   Goal: Pt to meet >/= 90% of their estimated nutrition needs   Monitor:  Intake, weight, procedure/test results, labs  Reason for Assessment: Consult for assessment of nutritional status  64 y.o. male  Admitting Dx: <principal problem not specified>  ASSESSMENT: This is a 64 year old man who had an episode today at noon of syncope. He felt generally unwell before he loss consciousness. PMHx sig for ischemic cardiomyopathy, Afib, CAD, and has an ICD implanted.  Height: Ht Readings from Last 1 Encounters:  09/16/14  (1.702 m)    Weight: Wt Readings from Last 1 Encounters:  09/16/14 185 lb 3 oz (84 kg)    Ideal Body Weight: 148 lbs  % Ideal Body Weight: 130%  Wt Readings from Last 10 Encounters:  09/16/14 185 lb 3 oz (84 kg)  07/03/14 185 lb (83.915 kg)  06/28/14 187 lb (84.823 kg)  06/21/14 186 lb 6.4 oz (84.55 kg)  06/02/14 187 lb 6.4 oz (85.004 kg)  09/07/13 196 lb (88.905 kg)  08/23/13 183 lb 12.8 oz (83.371 kg)  04/01/13 196 lb 4 oz (89.018 kg)  03/21/13 192 lb (87.091 kg)  12/10/12 193 lb (87.544 kg)    Usual Body Weight: Unknown-appears to be 185-195   BMI:  Body mass index is 29 kg/(m^2).  Estimated Nutritional Needs: Kcal: 1588  Protein: 126-143 (1.5-1.7 g/kg) Fluid:per md  Skin: Dry/Flaky  Diet Order: Diet Heart  EDUCATION NEEDS: -Education not appropriate at this time   Intake/Output Summary (Last 24 hours) at 09/16/14 1306 Last data filed at 09/16/14 0957  Gross per 24 hour  Intake    390 ml  Output    820 ml  Net   -430 ml    Last BM: 2/19  Labs:   Recent Labs Lab 09/15/14 0616 09/15/14 0620  09/16/14 0013 09/16/14 0449  NA  --  142 139 140  K  --  3.6 4.5 4.4  CL  --  95* 94* 94*  CO2  --  40* 41* 39*  BUN  --  CREATININE  --  0.84 1.16 1.03  CALCIUM  --  9.0 9.0 9.3  MG 1.8  --   --   --   GLUCOSE  --  108* 123* 127*    CBG (last 3)  No results for input(s): GLUCAP in the last 72 hours.  Scheduled Meds: . atorvastatin  40 mg Oral q1800  . B-complex with vitamin C  1 tablet Oral Daily  . budesonide-formoterol  2 puff Inhalation BID  . digoxin  0.125 mg Oral Daily  . gabapentin  300 mg Oral QHS  . ipratropium  0.5 mg Nebulization Q4H  . levalbuterol  0.63 mg Nebulization Q4H  . levofloxacin (LEVAQUIN) IV  750 mg Intravenous Q24H  . methylPREDNISolone (SOLU-MEDROL) injection  60 mg Intravenous 3 times per day  . metoprolol succinate  12.5 mg Oral QPM  . metoprolol succinate  25 mg Oral Daily  . pantoprazole  40 mg Oral Daily  . rivaroxaban  20 mg Oral Daily  . torsemide  20 mg Oral BID    Continuous Infusions:   Past Medical History  Diagnosis Date  . Ischemic cardiomyopathy  cardiomyopathy ischemic  . Paroxysmal atrial fibrillation   . Renal calculi   . 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 Chronic systolic dysfunction of left ventricle   . Coronary artery disease   . Chronic pain     chronic left abdominal pain  . 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 Difibrillator insertion  12/31/06    SJM Current DR RF ICD implanted by Dr Graciela HusbandsKlein  . Cholecystectomy    . Cardioversion N/A 12/10/2012    Procedure: CARDIOVERSION;  Surgeon: Marinus MawGregg W Taylor, MD;  [facility];  Service: Cardiovascular;  Laterality: N/A;  . Implantable cardioverter defibrillator (icd) generator change N/A 07/03/2014    Procedure: ICD GENERATOR CHANGE;  Surgeon: Marinus MawGregg W Taylor, MD;  [facility];  Service: Cardiovascular;  Laterality: N/A;     Christophe LouisNathan Franks RD, LDN Nutrition Pager: [phone] 09/16/2014 1:06 PM

## 2014-09-16 NOTE — Progress Notes (Signed)
Notified Dr. Elisabeth Pigeonevine that patient had had a couple of bradycardic moments since transfer to [facility] as low as 40's but heart rate increased quickly. Will continue to monitor.

## 2014-09-16 NOTE — Consult Note (Signed)
[name], [name]             ACCOUNT NO.:  [id number]  MEDICAL RECORD NO.:  [id number]  LOCATION:  A338                          FACILITY:  [facility]  PHYSICIAN:  Eulas Schweitzer L. Juanetta Gosling, M.D.DATE OF BIRTH:  1950/11/20  DATE OF CONSULTATION: DATE OF DISCHARGE:                                CONSULTATION   REASON FOR CONSULTATION:  Enlarging pulmonary nodule.  HISTORY:  This is a 64 year old, who has multiple medical problems.  He came to the hospital because of syncope.  He said he did not feel well before he lost consciousness and he did not have a seizure but did have some fluttering.  After he came to the emergency room, his defibrillator fired.  He has a long history of ischemic cardiomyopathy with ejection fraction of around 15-20%.  He says he is a little more short of breath than usual.  He says he feels weak.  He had a chest x-ray that was suggestive of pneumonia and then had CT that shows a nodule in the left upper lobe that has increased in size since October.  I have been seeing him in my office for COPD and he has severe COPD at baseline.  PAST MEDICAL HISTORY:  Positive for, 1. Ischemic cardiomyopathy. 2. Atrial fibrillation. 3. Coronary artery occlusive disease. 4. Chronic abdominal pain. 5. Implantable cardioverter-defibrillator.  He has had the cardioverter placed.  He has history of cholecystectomy.  SOCIAL HISTORY:  He has an approximately 40-50 pack year smoking history.  He does not use any alcohol.  FAMILY HISTORY:  Positive for heart disease in several family members and cancer in his mother.  At home, he has been taking, 1. Lipitor 40 mg daily. 2. B complex vitamins. 3. Symbicort 160/4.5 two puffs b.i.d. 4. Colchicine 0.6 b.i.d. 5. Digoxin 0.125 mg daily. 6. Gabapentin 300 mg b.i.d. 7. Xopenex inhaler as needed. 8. Losartan 50 mg daily. 9. Metoprolol-XL 25 mg daily. 10.Omeprazole 20 mg daily. 11.Xarelto 20 mg daily. 12.Torsemide 20 mg  b.i.d. 13.Nitroglycerin 0.4 under the tongue every 5 minutes.  PHYSICAL EXAMINATION:  GENERAL:  He looks somewhat short of breath at rest. HEENT:  His pupils are reactive.  Nose and throat are clear. CHEST:  Fairly clear with minimal rhonchi bilaterally. HEART:  Irregularly irregular without gallop. ABDOMEN:  Soft without any tenderness. EXTREMITIES:  No edema. NEUROLOGIC:  Grossly intact.  His CT shows bilateral pleural effusions which are essentially unchanged, some atelectasis of the right middle lobe but most importantly had increased posterior left upper lobe nodule that now measures 16 x 13 and about 6 months ago was 13 x 11.  I discussed that with him he understands that there is a significant potential that this is a lung cancer.  I told them that I think he is going to need some sort of a biopsy but typically a PET scan is done first to make sure there is not something that is easier to biopsy considering his COPD. With his multiple comorbidities and pretty severe COPD, he may not be a candidate for aggressive treatment.  Depending on what we find on the biopsy, he may need referral to the Thor[facility] Oncology [facility].  Thanks for allowing me  to see him with you.     Ameya Vowell L. [name], M.D.     ELH/MEDQ  D:  09/15/2014  T:  09/16/2014  Job:  [id number]

## 2014-09-16 NOTE — Progress Notes (Addendum)
Patient ID: [name], male   DOB: Jan 26, 1951, 64 y.o.   MRN: [id number] [facility] PROGRESS NOTE  [name] WYO:[id number] DOB: 1951-03-25 DOA: 09/14/2014 PCP: Monico Blitz, MD  Brief narrative:    64 y.o. male with past medical history of chronic systolic heart failure, status post AICD, last 2 D ECHO in 08/16/13 with EF of 25%, chronic atrial fibrillation on anticoagulation with xarelto, hypertension who presented to [facility] ED status post syncopal event prior to the admission. Patient reported feeling lightheaded prior to syncope. There was no report of other prodromal symptoms prior to syncope such as chest pain or shortness of breath or palpitations.  On admission, blood pressure was 93/70, HR 41 - 109, RR 20, oxygen saturation 94% on room air. Blood work revealed mild leukocytosis of 10.7, potassium 2.8, troponin level 0.04, lactic acid 2.6, BNP 335. Chest x-ray showed CHF, right basilar opacity which could reflect pneumonia, left upper lobe pulmonary nodule. CT chest with contrast showed bilateral pleural effusion right greater than left, increased in size of posterior left upper lobe nodule measuring 16 x 13 mm and previously 13 x 11 mm, malignancy not entirely excluded. Patient was admitted for further evaluation of syncopal event. Cardiology was consulted. Pulmonary was consulted for possible biopsy of the pulmonary nodule. They recommended however first outpatient PET scan.  This morning, patient was in more respiratory distress, breathing around 22 breath per minute, more rhonchorous on clinical exam and in pain. I placed order for CXR which showed slight worsening of interstitial edema and CHF, bilateral pleural effusions right greater than left. Hypotension precludes use of IV lasix but he may need at least a low dose if his BP improves. Order placed for transfer to [facility]U.  I spoke with the pt about his code status prior to transfer to SDU. He confirmed partial code with  cardiac resuscitation but no mechanical ventilation.  Assessment/Plan:     Principal Problem: Acute respiratory failure with hypoxia / COPD exacerbation / Bilateral pleural effusion right greater than left - Pt hypoxic on admission but current O2 saturation 96% while on [place] oxygen support - More respiratory distress this am so we placed order for nebulizer treatments scheduled nad as needed along with IV solumedrol 60 mg Q 8 hours - CXR showed slight worsening of interstitial edema and CHF, bilateral pleural effusions right greater than left. He is hypotensive now so cant use lasix at this time - Order placed for transfer to SDU. - in regards to pleural effusion, he is on torsemide, cant give lasix due to hypotension    Active Problems: Syncope - in patient with history of ICM and with ICD in situ - patient did have hypokalemia on admission which was supplemented; hypokalemia may have triggered a VT causing ICD discharge and syncopal event per cardiology  - so far patient is stable with no further reports of syncope - his potassium is 4.4 this am, will check potassium daily   Sepsis secondary to community acquired pneumonia / lactic acidosis / leukocytosis  - Sepsis criteria met on the admission with initial vitals signs that included hypotension, tachycardia, tachypnea, hypoxia (please note that oxygen saturation is 91% while patient is on a nasal cannula oxygen support). In addition patient had mild leukocytosis, 10.7, lactic acid elevated at 2.6. Evidence of pneumonia seen on chest x-ray. - Continue Levaquin which was started on admission.   Ischemic cardiomyopathy / mild troponin elevation  - Mild troponin elevation seen on the admission and persists,  0.04 --> 0.07 --> 0.06 - Likely demand ischemia from possible sepsis  Chronic atrial fibrillation - CHADS2/vasc score 4 - Patient is on anticoagulation with xarelto - Continue digoxin 0.125 mg daily - Hold etoprolol 25 mg daily due  to hypotension   Chronic combined systolic and diastolic heart failure / AICD (automatic cardioverter/defibrillator) present - 2-D echo in January 2015 showed ejection fraction of 25% with grade 1 diastolic dysfunction. - Continue digoxin, torsemide; hold losartan and metoprolol due to hypotension  - Daily weight and strict intake and output - Cardiology following   Hypokalemia - Due to torsemide - Supplemented and WNL  Pulmonary nodule - left upper lobe nodule measuring 16 x 13 mm and previously 13 x 11 mm, malignancy not entirely excluded - pulmonary consulted, will need outpt PET scan for further evaluation    DVT Prophylaxis  - on anticoagulation with xarelto    Code Status: Partial code (no intubation but ok chest compressions, medications); i spoke with the pt this am about his code status prior to transfer to SDU.  Family Communication:  plan of care discussed with the patient Disposition Plan: Patient in more pain this am, more short of breath so will transfer to SDU.  IV access:  Peripheral IV  Procedures and diagnostic studies:    Ct Chest W Contrast 09/14/2014  BILATERAL pleural effusions RIGHT greater than LEFT little changed with compressive atelectasis of the lower lobes.  Subsegmental atelectasis at anterior RIGHT middle lobe base.  Increase in size of a posterior LEFT upper lobe nodule measuring 16 x 13 mm previously 13 x 11 mm; malignancy is not excluded and tissue diagnosis is recommended.    Dg Chest Portable 1 View 09/14/2014    1. CHF. 2. Right basilar opacity which could reflect pneumonia or pleural fluid with atelectasis. Suggest two-view imaging when feasible. 3. Left upper lobe pulmonary nodule, reference chest CT 05/24/2014.    Medical Consultants:  Cardiology Pulmonary   Other Consultants:  None   IAnti-Infectives:   Levaquin 09/14/2014 -->    Manson Passey, MD  [facility] Pager [phone]  If 7PM-7AM, please contact  night-coverage [email or web address] Password [facility] 09/16/2014, 9:57 AM   LOS: 2 days    HPI/Subjective: No acute overnight events. This am looks more in distress, more pain and rhonchorous.  Objective: Filed Vitals:   09/15/14 2342 09/16/14 0413 09/16/14 0640 09/16/14 0723  BP:   101/54   Pulse:   102   Temp:   97.7 F (36.5 C)   TempSrc:   Oral   Resp:   22   Height:      SpO2: 97% 96% 92% 93%    Intake/Output Summary (Last 24 hours) at 09/16/14 0957 Last data filed at 09/16/14 0957  Gross per 24 hour  Intake    390 ml  Output    820 ml  Net   -430 ml    Exam:   General:  Pt is alert, in pain, no distress  Cardiovascular: Regular rate and rhythm, S1/S2 (+)  Respiratory: rhonchi throughout with wheezing, diminished breath sounds  Abdomen: Soft, non tender, non distended, bowel sounds present  Extremities: pulses DP and PT palpable bilaterally  Neuro: Grossly nonfocal  Data Reviewed: Basic Metabolic Panel:  Recent Labs Lab 09/14/14 1513 09/15/14 0616 09/15/14 0620 09/16/14 0013 09/16/14 0449  NA 139  --  142 139 140  K 2.8*  --  3.6 4.5 4.4  CL 95*  --  95* 94* 94*  CO2  39*  --  40* 41* 39*  GLUCOSE 122*  --  108* 123* 127*  BUN 13  --  $R'13 16 16  'hY$ CREATININE 0.94  --  0.84 1.16 1.03  CALCIUM 8.9  --  9.0 9.0 9.3  MG  --  1.8  --   --   --    Liver Function Tests:  Recent Labs Lab 09/15/14 0620  AST 23  ALT 20  ALKPHOS 79  BILITOT 0.9  PROT 7.2  ALBUMIN 3.8   No results for input(s): LIPASE, AMYLASE in the last 168 hours. No results for input(s): AMMONIA in the last 168 hours. CBC:  Recent Labs Lab 09/14/14 1513 09/15/14 0620 09/16/14 0449  WBC 10.7* 10.5 14.5*  NEUTROABS 7.7  --   --   HGB 14.7 14.5 14.1  HCT 44.3 45.5 45.1  MCV 96.9 99.8 101.3*  PLT 204 200 185   Cardiac Enzymes:  Recent Labs Lab 09/14/14 1513 09/15/14 0057 09/15/14 0620  TROPONINI 0.04* 0.07* 0.06*   BNP: Invalid input(s): POCBNP CBG: No results for  input(s): GLUCAP in the last 168 hours.  Recent Results (from the past 240 hour(s))  Blood culture (routine x 2)     Status: None (Preliminary result)   Collection Time: 09/14/14  3:13 PM  Result Value Ref Range Status   Specimen Description BLOOD LEFT ARM  Final   Special Requests BOTTLES DRAWN AEROBIC AND ANAEROBIC 6CC EACH  Final   Culture NO GROWTH 2 DAYS  Final   Report Status PENDING  Incomplete  Blood culture (routine x 2)     Status: None (Preliminary result)   Collection Time: 09/14/14  5:40 PM  Result Value Ref Range Status   Specimen Description BLOOD RIGHT HAND  Final   Special Requests BOTTLES DRAWN AEROBIC AND ANAEROBIC 6CC  Final   Culture NO GROWTH 2 DAYS  Final   Report Status PENDING  Incomplete     Scheduled Meds: . atorvastatin  40 mg Oral q1800  . B-complex with vitamin C  1 tablet Oral Daily  . budesonide-formoterol  2 puff Inhalation BID  . digoxin  0.125 mg Oral Daily  . gabapentin  300 mg Oral QHS  . ipratropium-albuterol  3 mL Nebulization Q4H  . levalbuterol  0.63 mg Nebulization Q4H WA  . levofloxacin (LEVAQUIN) IV  750 mg Intravenous Q24H  . methylPREDNISolone (SOLU-MEDROL) injection  60 mg Intravenous 3 times per day  . metoprolol succinate  12.5 mg Oral QPM  . metoprolol succinate  25 mg Oral Daily  . pantoprazole  40 mg Oral Daily  . rivaroxaban  20 mg Oral Daily  . torsemide  20 mg Oral BID   Continuous Infusions:

## 2014-09-17 ENCOUNTER — Inpatient Hospital Stay (HOSPITAL_COMMUNITY): Payer: Medicare Other

## 2014-09-17 LAB — BLOOD GAS, ARTERIAL
Acid-Base Excess: 16.8 mmol/L — ABNORMAL HIGH (ref 0.0–2.0)
Acid-Base Excess: 15.1 mmol/L — ABNORMAL HIGH (ref 0.0–2.0)
BICARBONATE: 40.3 meq/L — AB (ref 20.0–24.0)
Bicarbonate: 42.9 mEq/L — ABNORMAL HIGH (ref 20.0–24.0)
Delivery systems: POSITIVE
Drawn by: 105551
EXPIRATORY PAP: 6
FIO2: 45 %
Inspiratory PAP: 14
O2 CONTENT: 3.5 L/min
O2 Saturation: 91.7 %
O2 Saturation: 93.8 %
PCO2 ART: 61.3 mmHg — AB (ref 35.0–45.0)
PH ART: 7.377 (ref 7.350–7.450)
PH ART: 7.433 (ref 7.350–7.450)
PO2 ART: 61.6 mmHg — AB (ref 80.0–100.0)
Patient temperature: 37
Patient temperature: 37
RATE: 8 resp/min
TCO2: 38.4 mmol/L (ref 0–100)
TCO2: 35.6 mmol/L (ref 0–100)
pCO2 arterial: 74.7 mmHg (ref 35.0–45.0)
pO2, Arterial: 66.2 mmHg — ABNORMAL LOW (ref 80.0–100.0)

## 2014-09-17 MED ORDER — DIGOXIN 125 MCG PO TABS
0.1250 mg | ORAL_TABLET | Freq: Every day | ORAL | Status: DC
  Administered 2014-09-17 – 2014-09-18 (×2): 0.125 mg via ORAL
  Filled 2014-09-17 (×3): qty 1

## 2014-09-17 MED ORDER — METHYLPREDNISOLONE SODIUM SUCC 125 MG IJ SOLR
125.0000 mg | Freq: Four times a day (QID) | INTRAMUSCULAR | Status: DC
  Administered 2014-09-17 – 2014-09-20 (×12): 125 mg via INTRAVENOUS
  Filled 2014-09-17 (×12): qty 2

## 2014-09-17 MED ORDER — LORAZEPAM 2 MG/ML IJ SOLN
0.5000 mg | INTRAMUSCULAR | Status: AC
  Administered 2014-09-17: 0.5 mg via INTRAVENOUS
  Filled 2014-09-17: qty 1

## 2014-09-17 MED ORDER — LORAZEPAM 2 MG/ML IJ SOLN
1.0000 mg | Freq: Four times a day (QID) | INTRAMUSCULAR | Status: DC | PRN
  Administered 2014-09-17 – 2014-09-19 (×4): 1 mg via INTRAVENOUS
  Filled 2014-09-17 (×4): qty 1

## 2014-09-17 NOTE — Progress Notes (Signed)
Took patient off Bipap for a short time to eat. After 5 minutes, patient stated he could not breathe. Sats in the low 80's. Repositioned patient and replaced Bipap.

## 2014-09-17 NOTE — Progress Notes (Addendum)
Patient ID: [name], male   DOB: 05-Aug-1950, 64 y.o.   MRN: [id number] [facility] PROGRESS NOTE  [name] ACZ:[id number] DOB: 1950-12-02 DOA: 09/14/2014 PCP: Monico Blitz, MD  Brief narrative:    64 y.o. male with past medical history of chronic systolic heart failure, status post AICD, last 2 D ECHO in 08/16/13 with EF of 25%, chronic atrial fibrillation on anticoagulation with xarelto, hypertension who presented to [facility] status post syncopal event prior to the admission. Patient reported feeling lightheaded prior to syncope. There was no report of other prodromal symptoms prior to syncope such as chest pain or shortness of breath or palpitations.  On admission, blood pressure was 93/70, HR 41 - 109, RR 20, oxygen saturation 94% on room air. Blood work revealed mild leukocytosis of 10.7, potassium 2.8, troponin level 0.04, lactic acid 2.6, BNP 335. Chest x-ray showed CHF, right basilar opacity which could reflect pneumonia, left upper lobe pulmonary nodule. CT chest with contrast showed bilateral pleural effusion right greater than left, increased in size of posterior left upper lobe nodule measuring 16 x 13 mm and previously 13 x 11 mm, malignancy not entirely excluded. Patient was admitted for further evaluation of syncopal event. Cardiology was consulted. Pulmonary was consulted for possible biopsy of the pulmonary nodule. Recommended PET scan for evaluation prior to biopsy.  On the morning of 09/16/14, patient was in more respiratory distress, breathing around 22 breath per minute, more rhonchorous on clinical exam and he was in more pain. CXR showed worsening of interstitial edema and CHF, bilateral pleural effusions right greater than left. He was hypotensive and could not get IV lasix. He was on PO torsemide however. He was transferred to SDU 09/16/2014. Now he is on BiPAP.  Assessment/Plan:     Principal Problem: Acute respiratory failure with hypoxia / COPD  exacerbation / Bilateral pleural effusion right greater than left - Pt hypoxic on admission but improved with  oxygen support - on morning of 09/16/2014 pt in more respiratory distress and required transfer to SDU - CXR 2/20 showed worsening of interstitial edema and CHF, bilateral pleural effusions right greater than left. He was hypotensive and could not get IV lasix but he was on torsemide. - overnight, pt required BiPAP to keep his O2 sats above 90%. His repeat CXR again shows CHF. Patient's respiratory status presents medical conundrum since he most likely would benefit from lasix but hypotension precludes from giving him lasix - appreciate pulmonary following - added solumedrol 125 mg IV Q 6 hours  - Continue nebulizer treatments scheduled and PRN shortness of breath or wheezing  - monitor in SDU, continue BiPAP; ativan PRN for anxiety and agitation   Active Problems: Syncope - in patient with history of ICM and with ICD in situ - patient did have hypokalemia on admission which was supplemented; per cardio, hypokalemia may have triggered a VT causing ICD discharge and syncope  - potassium stable at 4.4; obtain blood work in am  Sepsis secondary to community acquired pneumonia / lactic acidosis / leukocytosis  - Sepsis criteria met on the admission with initial vitals signs that included hypotension, tachycardia, tachypnea, hypoxia (please note that oxygen saturation is 91% while patient is on a nasal cannula oxygen support). In addition patient had mild leukocytosis, 10.7, lactic acid elevated at 2.6. Evidence of pneumonia seen on chest x-ray. - Continue Levaquin   Ischemic cardiomyopathy / mild troponin elevation  - Mild troponin elevation seen on the admission and persists, 0.04 -->  0.07 --> 0.06 - Likely demand ischemia from possible sepsis  Chronic atrial fibrillation - CHADS2/vasc score 4 - Continue anticoagulation with xarelto - Continue digoxin 0.125 mg daily - Continue low  dose metoprolol   Chronic combined systolic and diastolic heart failure / AICD (automatic cardioverter/defibrillator) present - 2-D echo in January 2015 showed ejection fraction of 25% with grade 1 diastolic dysfunction. - Continue digoxin, torsemide, metoprolol  - Daily weight and strict intake and output - Cardiology following   Hypokalemia - Due to torsemide - Supplemented   Pulmonary nodule - left upper lobe nodule measuring 16 x 13 mm and previously 13 x 11 mm, malignancy not entirely excluded - pulmonary consulted, will need outpt PET scan for further evaluation    DVT Prophylaxis  - on anticoagulation with xarelto    Code Status: Partial code (no intubation but ok chest compressions, medications); i spoke with the pt on the morning of 09/16/2014 when he ws alert and awake about his code status prior to transfer to SDU.  Family Communication: family not at the bedside  Disposition Plan: Patient now on BiPAP, acutely ill, not stable for discahrge  IV access:  Peripheral IV  Procedures and diagnostic studies:    Ct Chest W Contrast 09/14/2014  BILATERAL pleural effusions RIGHT greater than LEFT little changed with compressive atelectasis of the lower lobes.  Subsegmental atelectasis at anterior RIGHT middle lobe base.  Increase in size of a posterior LEFT upper lobe nodule measuring 16 x 13 mm previously 13 x 11 mm; malignancy is not excluded and tissue diagnosis is recommended.    Dg Chest Portable 1 View 09/14/2014    1. CHF. 2. Right basilar opacity which could reflect pneumonia or pleural fluid with atelectasis. Suggest two-view imaging when feasible. 3. Left upper lobe pulmonary nodule, reference chest CT 05/24/2014.    Dg Chest Port 1 View 09/17/2014   Persistent CHF with pulmonary edema and small RIGHT pleural effusion, little changed.   Electronically Signed   By: Lavonia Dana M.D.   On: 09/17/2014 08:16   Medical Consultants:  Cardiology Pulmonary   Other Consultants:   None   IAnti-Infectives:   Levaquin 09/14/2014 -->     Leisa Lenz, MD  [facility] Pager [phone]  If 7PM-7AM, please contact night-coverage [email or web address] Password [facility] 09/17/2014, 5:12 PM   LOS: 3 days    HPI/Subjective: No acute overnight events.  Objective: Filed Vitals:   09/17/14 1400 09/17/14 1500 09/17/14 1600 09/17/14 1633  BP: 102/69 106/62 98/72   Pulse: 123 116 115   Temp:    97.4 F (36.3 C)  TempSrc:    Axillary  Resp: _0 Height:      Weight:      SpO2: 97% 96% 97%     Intake/Output Summary (Last 24 hours) at 09/17/14 1712 Last data filed at 09/17/14 1300  Gross per 24 hour  Intake    440 ml  Output   1400 ml  Net   -960 ml    Exam:   General:  Pt is on BiPAP, no distress this am  Cardiovascular: tachycardic, (+) S1, S2  Respiratory: rhonchorous, wheezing bilaterally   Abdomen: non tender, non distended, (+) BS  Extremities: palpable pulses, no edema   Neuro: no focal deficits  Data Reviewed: Basic Metabolic Panel:  Recent Labs Lab 09/14/14 1513 09/15/14 0616 09/15/14 0620 09/16/14 0013 09/16/14 0449  NA 139  --  142 139 140  K 2.8*  --  3.6 4.5 4.4  CL 95*  --  95* 94* 94*  CO2 39*  --  40* 41* 39*  GLUCOSE 122*  --  108* 123* 127*  BUN 13  --  _0 CREATININE 0.94  --  0.84 1.16 1.03  CALCIUM 8.9  --  9.0 9.0 9.3  MG  --  1.8  --   --   --    Liver Function Tests:  Recent Labs Lab 09/15/14 0620  AST 23  ALT 20  ALKPHOS 79  BILITOT 0.9  PROT 7.2  ALBUMIN 3.8   No results for input(s): LIPASE, AMYLASE in the last 168 hours. No results for input(s): AMMONIA in the last 168 hours. CBC:  Recent Labs Lab 09/14/14 1513 09/15/14 0620 09/16/14 0449  WBC 10.7* 10.5 14.5*  NEUTROABS 7.7  --   --   HGB 14.7 14.5 14.1  HCT 44.3 45.5 45.1  MCV 96.9 99.8 101.3*  PLT 204 200 185   Cardiac Enzymes:  Recent Labs Lab 09/14/14 1513 09/15/14 0057 09/15/14 0620  TROPONINI 0.04* 0.07*  0.06*   BNP: Invalid input(s): POCBNP CBG: No results for input(s): GLUCAP in the last 168 hours.  Recent Results (from the past 240 hour(s))  Blood culture (routine x 2)     Status: None (Preliminary result)   Collection Time: 09/14/14  3:13 PM  Result Value Ref Range Status   Specimen Description BLOOD LEFT ARM  Final   Special Requests BOTTLES DRAWN AEROBIC AND ANAEROBIC 6CC EACH  Final   Culture NO GROWTH 3 DAYS  Final   Report Status PENDING  Incomplete  Blood culture (routine x 2)     Status: None (Preliminary result)   Collection Time: 09/14/14  5:40 PM  Result Value Ref Range Status   Specimen Description BLOOD RIGHT HAND  Final   Special Requests BOTTLES DRAWN AEROBIC AND ANAEROBIC 6CC  Final   Culture NO GROWTH 3 DAYS  Final   Report Status PENDING  Incomplete  MRSA PCR Screening     Status: None   Collection Time: 09/16/14  9:55 AM  Result Value Ref Range Status   MRSA by PCR NEGATIVE NEGATIVE Final    Comment:        The GeneXpert MRSA Assay (FDA approved for NASAL specimens only), is one component of a comprehensive MRSA colonization surveillance program. It is not intended to diagnose MRSA infection nor to guide or monitor treatment for MRSA infections.      Scheduled Meds: . antiseptic oral rinse  7 mL Mouth Rinse BID  . atorvastatin  40 mg Oral q1800  . B-complex with vitamin C  1 tablet Oral Daily  . budesonide-formoterol  2 puff Inhalation BID  . digoxin  0.125 mg Oral Daily  . feeding supplement (ENSURE COMPLETE)  237 mL Oral BID BM  . gabapentin  300 mg Oral QHS  . ipratropium  0.5 mg Nebulization Q4H  . levalbuterol  0.63 mg Nebulization Q4H  . levofloxacin (LEVAQUIN) IV  750 mg Intravenous Q24H  . methylPREDNISolone (SOLU-MEDROL) injection  125 mg Intravenous Q6H  . metoprolol succinate  12.5 mg Oral QPM  . metoprolol succinate  25 mg Oral Daily  . pantoprazole  40 mg Oral Daily  . rivaroxaban  20 mg Oral Daily  . torsemide  20 mg Oral BID    Continuous Infusions:

## 2014-09-17 NOTE — Progress Notes (Addendum)
Subjective: He has had more trouble with confusion and shortness of breath. His PCO2 was measured and found to be in the 70s. Previous blood gas from a bout 18 months to 2 years ago shows PCO2 of 40. He is now on BiPAP. He is having trouble tolerating the BiPAP. He does not want to be intubated  Objective: Vital signs in last 24 hours: Temp:  [97.9 F (36.6 C)-98.9 F (37.2 C)] 97.9 F (36.6 C) (02/21 0400) Pulse Rate:  [95-119] 95 (02/21 0600) Resp:  [21-29] 24 (02/21 0600) BP: (87-109)/(57-72) 96/65 mmHg (02/21 0600) SpO2:  [86 %-98 %] 94 % (02/21 0600) Weight:  [84 kg (185 lb 3 oz)-84.8 kg (186 lb 15.2 oz)] 84.8 kg (186 lb 15.2 oz) (02/21 0504) Weight change:  Last BM Date: 09/15/14  Intake/Output from previous day: 02/20 0701 - 02/21 0700 In: 590 [P.O.:420; I.V.:20; IV Piggyback:150] Out: 1770 [Urine:1770]  PHYSICAL EXAM General appearance: alert, moderate distress and Mildly confused Resp: rhonchi bilaterally and wheezes bilaterally Cardio: regular rate and rhythm, S1, S2 normal, no murmur, click, rub or gallop GI: soft, non-tender; bowel sounds normal; no masses,  no organomegaly Extremities: extremities normal, atraumatic, no cyanosis or edema  Lab Results:  Results for orders placed or performed during the hospital encounter of 09/14/14 (from the past 48 hour(s))  Basic metabolic panel     Status: Abnormal   Collection Time: 09/16/14 12:13 AM  Result Value Ref Range   Sodium 139 135 - 145 mmol/L   Potassium 4.5 3.5 - 5.1 mmol/L    Comment: DELTA CHECK NOTED   Chloride 94 (L) 96 - 112 mmol/L   CO2 41 (HH) 19 - 32 mmol/L    Comment: CRITICAL RESULT CALLED TO, READ BACK BY AND VERIFIED WITH:  WRIGHT,A @ 0057 ON 09/16/14 BY WOODIE,J    Glucose, Bld 123 (H) 70 - 99 mg/dL   BUN 16 6 - 23 mg/dL   Creatinine, Ser 1.16 0.50 - 1.35 mg/dL   Calcium 9.0 8.4 - 10.5 mg/dL   GFR calc non Af Amer 64 (L) >90 mL/min   GFR calc Af Amer 75 (L) >90 mL/min    Comment: (NOTE) The  eGFR has been calculated using the CKD EPI equation. This calculation has not been validated in all clinical situations. eGFR's persistently <90 mL/min signify possible Chronic Kidney Disease.    Anion gap 4 (L) 5 - 15  Basic metabolic panel     Status: Abnormal   Collection Time: 09/16/14  4:49 AM  Result Value Ref Range   Sodium 140 135 - 145 mmol/L   Potassium 4.4 3.5 - 5.1 mmol/L   Chloride 94 (L) 96 - 112 mmol/L   CO2 39 (H) 19 - 32 mmol/L   Glucose, Bld 127 (H) 70 - 99 mg/dL   BUN 16 6 - 23 mg/dL   Creatinine, Ser 1.03 0.50 - 1.35 mg/dL   Calcium 9.3 8.4 - 10.5 mg/dL   GFR calc non Af Amer 74 (L) >90 mL/min   GFR calc Af Amer 86 (L) >90 mL/min    Comment: (NOTE) The eGFR has been calculated using the CKD EPI equation. This calculation has not been validated in all clinical situations. eGFR's persistently <90 mL/min signify possible Chronic Kidney Disease.    Anion gap 7 5 - 15  CBC     Status: Abnormal   Collection Time: 09/16/14  4:49 AM  Result Value Ref Range   WBC 14.5 (H) 4.0 - 10.5 K/uL  RBC 4.45 4.22 - 5.81 MIL/uL   Hemoglobin 14.1 13.0 - 17.0 g/dL   HCT 45.1 39.0 - 52.0 %   MCV 101.3 (H) 78.0 - 100.0 fL   MCH 31.7 26.0 - 34.0 pg   MCHC 31.3 30.0 - 36.0 g/dL   RDW 13.9 11.5 - 15.5 %   Platelets 185 150 - 400 K/uL  MRSA PCR Screening     Status: None   Collection Time: 09/16/14  9:55 AM  Result Value Ref Range   MRSA by PCR NEGATIVE NEGATIVE    Comment:        The GeneXpert MRSA Assay (FDA approved for NASAL specimens only), is one component of a comprehensive MRSA colonization surveillance program. It is not intended to diagnose MRSA infection nor to guide or monitor treatment for MRSA infections.   Blood gas, arterial     Status: Abnormal   Collection Time: 09/17/14  4:16 AM  Result Value Ref Range   O2 Content 3.5 L/min   Delivery systems NASAL CANNULA    pH, Arterial 7.377 7.350 - 7.450   pCO2 arterial 74.7 (HH) 35.0 - 45.0 mmHg    Comment:  CRITICAL RESULT CALLED TO, READ BACK BY AND VERIFIED WITH:  TO NEILSON T RN AT 0424 09/17/2014 BY [facility] RRT    pO2, Arterial 61.6 (L) 80.0 - 100.0 mmHg   Bicarbonate 42.9 (H) 20.0 - 24.0 mEq/L   TCO2 38.4 0 - 100 mmol/L   Acid-Base Excess 16.8 (H) 0.0 - 2.0 mmol/L   O2 Saturation 91.7 %   Patient temperature 37.0    Collection site BRACHIAL ARTERY    Drawn by [id number]    Sample type ARTERIAL    Allens test (pass/fail) NOT INDICATED (A) PASS    ABGS  Recent Labs  09/17/14 0416  PHART 7.377  PO2ART 61.6*  TCO2 38.4  HCO3 42.9*   CULTURES Recent Results (from the past 240 hour(s))  Blood culture (routine x 2)     Status: None (Preliminary result)   Collection Time: 09/14/14  3:13 PM  Result Value Ref Range Status   Specimen Description BLOOD LEFT ARM  Final   Special Requests BOTTLES DRAWN AEROBIC AND ANAEROBIC 6CC EACH  Final   Culture NO GROWTH 3 DAYS  Final   Report Status PENDING  Incomplete  Blood culture (routine x 2)     Status: None (Preliminary result)   Collection Time: 09/14/14  5:40 PM  Result Value Ref Range Status   Specimen Description BLOOD RIGHT HAND  Final   Special Requests BOTTLES DRAWN AEROBIC AND ANAEROBIC 6CC  Final   Culture NO GROWTH 3 DAYS  Final   Report Status PENDING  Incomplete  MRSA PCR Screening     Status: None   Collection Time: 09/16/14  9:55 AM  Result Value Ref Range Status   MRSA by PCR NEGATIVE NEGATIVE Final    Comment:        The GeneXpert MRSA Assay (FDA approved for NASAL specimens only), is one component of a comprehensive MRSA colonization surveillance program. It is not intended to diagnose MRSA infection nor to guide or monitor treatment for MRSA infections.    Studies/Results: Dg Chest Port 1 View  09/16/2014   CLINICAL DATA:  CHF, syncope and possible pneumonia. Known left upper lobe pulmonary nodule.  EXAM: PORTABLE CHEST - 1 VIEW  COMPARISON:  09/14/2014  FINDINGS: Interstitial edema/ CHF is slightly worse by chest  x-ray. Bilateral pleural effusions present, right greater than  left. There is associated bilateral lower lobe atelectasis, right greater than left. Stable cardiac enlargement and appearance of pacemaker.  IMPRESSION: Slight worsening of interstitial edema and CHF by chest x-ray. Bilateral pleural effusions remain present, right greater than left with associated compressive bilateral lower lobe atelectasis.   Electronically Signed   By: Aletta Edouard M.D.   On: 09/16/2014 09:44    Medications:  Prior to Admission:  Prescriptions prior to admission  Medication Sig Dispense Refill Last Dose  . atorvastatin (LIPITOR) 40 MG tablet Take 1 tablet (40 mg total) by mouth daily. 30 tablet 3 09/14/2014 at Unknown time  . B Complex-C (B-COMPLEX WITH VITAMIN C) tablet Take 1 tablet by mouth daily.   09/14/2014 at Unknown time  . budesonide-formoterol (SYMBICORT) 160-4.5 MCG/ACT inhaler Inhale 2 puffs into the lungs 2 (two) times daily.   09/14/2014 at Unknown time  . colchicine 0.6 MG tablet Take 0.6 mg by mouth 2 (two) times daily as needed (for gout).    09/13/2014 at Unknown time  . digoxin (LANOXIN) 0.125 MG tablet Take 1 tablet (0.125 mg total) by mouth daily. 90 tablet 3 09/14/2014 at Unknown time  . gabapentin (NEURONTIN) 300 MG capsule Take 300 mg by mouth at bedtime.   09/13/2014 at Unknown time  . HYDROcodone-acetaminophen (NORCO/VICODIN) 5-325 MG per tablet Take 1 tablet by mouth 2 (two) times daily as needed for moderate pain.   09/13/2014 at Unknown time  . levalbuterol (XOPENEX HFA) 45 MCG/ACT inhaler Inhale 1-2 puffs into the lungs every 6 (six) hours as needed for wheezing. (Patient taking differently: Inhale 2 puffs into the lungs every 4 (four) hours as needed for wheezing. ) 1 Inhaler 12 09/14/2014 at Unknown time  . losartan (COZAAR) 25 MG tablet Take 25 mg by mouth daily.   09/14/2014 at Unknown time  . losartan (COZAAR) 50 MG tablet Take 1 tablet (50 mg total) by mouth daily. 90 tablet 3 09/14/2014  at Unknown time  . metoprolol succinate (TOPROL-XL) 25 MG 24 hr tablet Take 1 tablet (25 mg total) by mouth daily. 30 tablet 0 09/14/2014 at 800  . omeprazole (PRILOSEC) 20 MG capsule Take 20 mg by mouth daily.   09/14/2014 at Unknown time  . rivaroxaban (XARELTO) 20 MG TABS tablet Take 1 tablet (20 mg total) by mouth daily. 30 tablet 6 09/14/2014 at Unknown time  . torsemide (DEMADEX) 20 MG tablet Take 1 tablet (20 mg total) by mouth 2 (two) times daily. 120 tablet 3 09/14/2014 at Unknown time  . nitroGLYCERIN (NITROSTAT) 0.4 MG SL tablet Place 1 tablet (0.4 mg total) under the tongue every 5 (five) minutes as needed for chest pain. 25 tablet 2 unknown   Scheduled: . antiseptic oral rinse  7 mL Mouth Rinse BID  . atorvastatin  40 mg Oral q1800  . B-complex with vitamin C  1 tablet Oral Daily  . budesonide-formoterol  2 puff Inhalation BID  . digoxin  0.125 mg Oral Daily  . feeding supplement (ENSURE COMPLETE)  237 mL Oral BID BM  . gabapentin  300 mg Oral QHS  . ipratropium  0.5 mg Nebulization Q4H  . levalbuterol  0.63 mg Nebulization Q4H  . levofloxacin (LEVAQUIN) IV  750 mg Intravenous Q24H  . LORazepam  0.5 mg Intravenous STAT  . methylPREDNISolone (SOLU-MEDROL) injection  60 mg Intravenous 3 times per day  . metoprolol succinate  12.5 mg Oral QPM  . metoprolol succinate  25 mg Oral Daily  . pantoprazole  40 mg  Oral Daily  . rivaroxaban  20 mg Oral Daily  . torsemide  20 mg Oral BID   Continuous:  XBW:[id number], HYDROcodone-acetaminophen, HYDROmorphone (DILAUDID) injection, ipratropium-albuterol, LORazepam, nitroGLYCERIN, ondansetron **OR** ondansetron (ZOFRAN) IV  Assesment: He is known to have ischemic cardiomyopathy. He had acute on chronic combined systolic and diastolic heart failure. He has a pulmonary nodule that will need further evaluation as an outpatient. He now seems to be having acute respiratory failure probably on the basis of his heart failure plus his known severe  COPD. He is on BiPAP. He told Dr. Charlies Silvers yesterday that he does not want to be intubated. He is having some trouble with being anxious and he has Ativan ordered. Active Problems:   Ischemic cardiomyopathy   Chronic atrial fibrillation   Chronic systolic heart failure   Pleural effusion   Acute on chronic combined systolic and diastolic CHF (congestive heart failure)   AICD (automatic cardioverter/defibrillator) present   Syncope    Plan: He is already on IV antibiotics and inhaled bronchodilators and IV steroids. I increased the dose and frequency of his steroids since he is worse. Pelvic there is much else to do. I would continue BiPAP give him medications for anxiety if needed. I've ordered a chest x-ray to make sure he hasn't developed pneumonia etc.    LOS: 3 days   Massey Ruhland L 09/17/2014, 6:57 AM

## 2014-09-17 NOTE — Progress Notes (Addendum)
Patient appears to have many PVC followed by pacer spikes back to rhythm then repeats. At times Monitor reads pacer not capturing.

## 2014-09-17 NOTE — Progress Notes (Signed)
SHIFT NOTE: The patient had increased confusion throughout the night.  Reviewing labs patient had a high blood CO2 of 41 and 39 earlier that day.  An STAT blood gas was drawn with a critical pCO2 of 74.7.  On-call MD informed and order for Bipap given.

## 2014-09-17 NOTE — Progress Notes (Signed)
Dr. Elisabeth Pigeonevine notified of new ABG results.

## 2014-09-17 NOTE — Progress Notes (Signed)
Patient slowly during night became  more confused . Patient placed on BiPAP 14/6 35 for low Saturation and increased PaCO2 by abg. Patient appears to tolerate well.

## 2014-09-17 NOTE — Progress Notes (Signed)
This is documentation of my visit yesterday 2/ 20/ 2016. I apparently neglected to dictate my note. I saw him after he had been transferred from the floor to stepdown. By the time I saw him he was lying flat and looked comfortable. He had already had treatment for his acute respiratory distress at that time.  Exam shows he is lying flat breathing about 20 times a minute looks comfortable with no complaints. His chest shows some rales in the bases and his heart is regular at this time. His abdomen is soft  I think this is probably from congestive heart failure but he does have severe COPD as well. The fact that he is this much better as quickly as he is makes me think it's more related to CHF  I will continue to follow because of his COPD

## 2014-09-18 ENCOUNTER — Inpatient Hospital Stay (HOSPITAL_COMMUNITY): Payer: Medicare Other

## 2014-09-18 DIAGNOSIS — R55 Syncope and collapse: Secondary | ICD-10-CM | POA: Diagnosis present

## 2014-09-18 DIAGNOSIS — J189 Pneumonia, unspecified organism: Secondary | ICD-10-CM | POA: Diagnosis present

## 2014-09-18 LAB — CBC
HCT: 45.4 % (ref 39.0–52.0)
Hemoglobin: 14.3 g/dL (ref 13.0–17.0)
MCH: 31.8 pg (ref 26.0–34.0)
MCHC: 31.5 g/dL (ref 30.0–36.0)
MCV: 101.1 fL — ABNORMAL HIGH (ref 78.0–100.0)
Platelets: 177 10*3/uL (ref 150–400)
RBC: 4.49 MIL/uL (ref 4.22–5.81)
RDW: 13.6 % (ref 11.5–15.5)
WBC: 18.4 10*3/uL — AB (ref 4.0–10.5)

## 2014-09-18 LAB — BASIC METABOLIC PANEL
Anion gap: 4 — ABNORMAL LOW (ref 5–15)
BUN: 34 mg/dL — ABNORMAL HIGH (ref 6–23)
CHLORIDE: 91 mmol/L — AB (ref 96–112)
CO2: 44 mmol/L (ref 19–32)
Calcium: 9.1 mg/dL (ref 8.4–10.5)
Creatinine, Ser: 1.01 mg/dL (ref 0.50–1.35)
GFR, EST AFRICAN AMERICAN: 88 mL/min — AB (ref >90)
GFR, EST NON AFRICAN AMERICAN: 76 mL/min — AB (ref >90)
GLUCOSE: 184 mg/dL — AB (ref 70–99)
Potassium: 4.5 mmol/L (ref 3.5–5.1)
SODIUM: 139 mmol/L (ref 135–145)

## 2014-09-18 LAB — BLOOD GAS, ARTERIAL
ACID-BASE EXCESS: 19.5 mmol/L — AB (ref 0.0–2.0)
Bicarbonate: 46.1 mEq/L — ABNORMAL HIGH (ref 20.0–24.0)
FIO2: 35 %
O2 SAT: 95.2 %
PH ART: 7.349 — AB (ref 7.350–7.450)
Patient temperature: 37
TCO2: 40.9 mmol/L (ref 0–100)
pCO2 arterial: 85.9 mmHg (ref 35.0–45.0)
pO2, Arterial: 81.6 mmHg (ref 80.0–100.0)

## 2014-09-18 LAB — MAGNESIUM: Magnesium: 2.3 mg/dL (ref 1.5–2.5)

## 2014-09-18 MED ORDER — IPRATROPIUM BROMIDE 0.02 % IN SOLN
0.5000 mg | Freq: Four times a day (QID) | RESPIRATORY_TRACT | Status: DC
  Administered 2014-09-18 – 2014-09-22 (×16): 0.5 mg via RESPIRATORY_TRACT
  Filled 2014-09-18 (×16): qty 2.5

## 2014-09-18 MED ORDER — LEVALBUTEROL HCL 0.63 MG/3ML IN NEBU
0.6300 mg | INHALATION_SOLUTION | Freq: Four times a day (QID) | RESPIRATORY_TRACT | Status: DC
  Administered 2014-09-18 – 2014-09-22 (×16): 0.63 mg via RESPIRATORY_TRACT
  Filled 2014-09-18 (×16): qty 3

## 2014-09-18 NOTE — Progress Notes (Signed)
Received blood gas report on [name]. Tracked Dr. [name] on 300 and gave report. Stated he will get Respiratory to change some bipap setting to see if that helped due to patient refusing intubation.

## 2014-09-18 NOTE — Progress Notes (Signed)
Subjective: He is overall I think about the same. He still somewhat confused but a little better. He is on BiPAP. His blood gas yesterday at around noon showed that his PCO2 had dropped from 74.7 to 61.3. Chest x-ray still showed CHF.  Objective: Vital signs in last 24 hours: Temp:  [97.1 F (36.2 C)-98.4 F (36.9 C)] 97.1 F (36.2 C) (02/22 0400) Pulse Rate:  [56-148] 98 (02/22 0600) Resp:  [15-32] 19 (02/22 0600) BP: (79-125)/(53-95) 96/63 mmHg (02/22 0600) SpO2:  [88 %-98 %] 93 % (02/22 0655) FiO2 (%):  [35 %-45 %] 35 % (02/22 0655) Weight:  [83.7 kg (184 lb 8.4 oz)] 83.7 kg (184 lb 8.4 oz) (02/22 0500) Weight change: -0.3 kg (-10.6 oz) Last BM Date: 09/15/14  Intake/Output from previous day: 02/21 0701 - 02/22 0700 In: 630 [P.O.:420; I.V.:60; IV Piggyback:150] Out: 1775 [Urine:1775]  PHYSICAL EXAM General appearance: He is on BiPAP, sleepy but arousable Resp: rales bilaterally and rhonchi bilaterally Cardio: regular rate and rhythm, S1, S2 normal, no murmur, click, rub or gallop GI: soft, non-tender; bowel sounds normal; no masses,  no organomegaly Extremities: extremities normal, atraumatic, no cyanosis or edema  Lab Results:  Results for orders placed or performed during the hospital encounter of 09/14/14 (from the past 48 hour(s))  MRSA PCR Screening     Status: None   Collection Time: 09/16/14  9:55 AM  Result Value Ref Range   MRSA by PCR NEGATIVE NEGATIVE    Comment:        The GeneXpert MRSA Assay (FDA approved for NASAL specimens only), is one component of a comprehensive MRSA colonization surveillance program. It is not intended to diagnose MRSA infection nor to guide or monitor treatment for MRSA infections.   Blood gas, arterial     Status: Abnormal   Collection Time: 09/17/14  4:16 AM  Result Value Ref Range   O2 Content 3.5 L/min   Delivery systems NASAL CANNULA    pH, Arterial 7.377 7.350 - 7.450   pCO2 arterial 74.7 (HH) 35.0 - 45.0 mmHg   Comment: CRITICAL RESULT CALLED TO, READ BACK BY AND VERIFIED WITH:  TO NEILSON T RN AT 0424 09/17/2014 BY [facility] RRT    pO2, Arterial 61.6 (L) 80.0 - 100.0 mmHg   Bicarbonate 42.9 (H) 20.0 - 24.0 mEq/L   TCO2 38.4 0 - 100 mmol/L   Acid-Base Excess 16.8 (H) 0.0 - 2.0 mmol/L   O2 Saturation 91.7 %   Patient temperature 37.0    Collection site BRACHIAL ARTERY    Drawn by [id number]    Sample type ARTERIAL    Allens test (pass/fail) NOT INDICATED (A) PASS  Blood gas, arterial     Status: Abnormal   Collection Time: 09/17/14 12:33 PM  Result Value Ref Range   FIO2 45.00 %   Delivery systems BILEVEL POSITIVE AIRWAY PRESSURE    Rate 8.0 resp/min   Inspiratory PAP 14    Expiratory PAP 6    pH, Arterial 7.433 7.350 - 7.450   pCO2 arterial 61.3 (HH) 35.0 - 45.0 mmHg    Comment: CRITICAL RESULT CALLED TO, READ BACK BY AND VERIFIED WITH: ERICA SPANGLER,RN AT 1240 BY VANESSA LAWSON,RRT,RRT ON2/21/2016    pO2, Arterial 66.2 (L) 80.0 - 100.0 mmHg   Bicarbonate 40.3 (H) 20.0 - 24.0 mEq/L   TCO2 35.6 0 - 100 mmol/L   Acid-Base Excess 15.1 (H) 0.0 - 2.0 mmol/L   O2 Saturation 93.8 %   Patient temperature 37.0  Collection site RIGHT RADIAL    Drawn by COLLECTED BY RT    Sample type ARTERIAL    Allens test (pass/fail) PASS PASS    ABGS  Recent Labs  09/17/14 1233  PHART 7.433  PO2ART 66.2*  TCO2 35.6  HCO3 40.3*   CULTURES Recent Results (from the past 240 hour(s))  Blood culture (routine x 2)     Status: None (Preliminary result)   Collection Time: 09/14/14  3:13 PM  Result Value Ref Range Status   Specimen Description BLOOD LEFT ARM  Final   Special Requests BOTTLES DRAWN AEROBIC AND ANAEROBIC 6CC EACH  Final   Culture NO GROWTH 3 DAYS  Final   Report Status PENDING  Incomplete  Blood culture (routine x 2)     Status: None (Preliminary result)   Collection Time: 09/14/14  5:40 PM  Result Value Ref Range Status   Specimen Description BLOOD RIGHT HAND  Final   Special Requests  BOTTLES DRAWN AEROBIC AND ANAEROBIC 6CC  Final   Culture NO GROWTH 3 DAYS  Final   Report Status PENDING  Incomplete  MRSA PCR Screening     Status: None   Collection Time: 09/16/14  9:55 AM  Result Value Ref Range Status   MRSA by PCR NEGATIVE NEGATIVE Final    Comment:        The GeneXpert MRSA Assay (FDA approved for NASAL specimens only), is one component of a comprehensive MRSA colonization surveillance program. It is not intended to diagnose MRSA infection nor to guide or monitor treatment for MRSA infections.    Studies/Results: Dg Chest Port 1 View  09/17/2014   CLINICAL DATA:  Respiratory failure, history ischemic cardiomyopathy, coronary artery disease  EXAM: PORTABLE CHEST - 1 VIEW  COMPARISON:  Portable exam 0604 hr compared to 22,016  FINDINGS: RIGHT subclavian transvenous pacemaker leads project over RIGHT atrium RIGHT ventricle.  Enlargement of cardiac silhouette with pulmonary vascular congestion.  Interstitial infiltrates bilaterally likely pulmonary edema and CHF.  Small RIGHT pleural effusion.  No pneumothorax.  Bones demineralized.  IMPRESSION: Persistent CHF with pulmonary edema and small RIGHT pleural effusion, little changed.   Electronically Signed   By: Ulyses SouthwardMark  Boles M.D.   On: 09/17/2014 08:16   Dg Chest Port 1 View  09/16/2014   CLINICAL DATA:  CHF, syncope and possible pneumonia. Known left upper lobe pulmonary nodule.  EXAM: PORTABLE CHEST - 1 VIEW  COMPARISON:  09/14/2014  FINDINGS: Interstitial edema/ CHF is slightly worse by chest x-ray. Bilateral pleural effusions present, right greater than left. There is associated bilateral lower lobe atelectasis, right greater than left. Stable cardiac enlargement and appearance of pacemaker.  IMPRESSION: Slight worsening of interstitial edema and CHF by chest x-ray. Bilateral pleural effusions remain present, right greater than left with associated compressive bilateral lower lobe atelectasis.   Electronically Signed   By:  Irish LackGlenn  Yamagata M.D.   On: 09/16/2014 09:44    Medications:  Prior to Admission:  Prescriptions prior to admission  Medication Sig Dispense Refill Last Dose  . atorvastatin (LIPITOR) 40 MG tablet Take 1 tablet (40 mg total) by mouth daily. 30 tablet 3 09/14/2014 at Unknown time  . B Complex-C (B-COMPLEX WITH VITAMIN C) tablet Take 1 tablet by mouth daily.   09/14/2014 at Unknown time  . budesonide-formoterol (SYMBICORT) 160-4.5 MCG/ACT inhaler Inhale 2 puffs into the lungs 2 (two) times daily.   09/14/2014 at Unknown time  . colchicine 0.6 MG tablet Take 0.6 mg by mouth 2 (  two) times daily as needed (for gout).    09/13/2014 at Unknown time  . digoxin (LANOXIN) 0.125 MG tablet Take 1 tablet (0.125 mg total) by mouth daily. 90 tablet 3 09/14/2014 at Unknown time  . gabapentin (NEURONTIN) 300 MG capsule Take 300 mg by mouth at bedtime.   09/13/2014 at Unknown time  . HYDROcodone-acetaminophen (NORCO/VICODIN) 5-325 MG per tablet Take 1 tablet by mouth 2 (two) times daily as needed for moderate pain.   09/13/2014 at Unknown time  . levalbuterol (XOPENEX HFA) 45 MCG/ACT inhaler Inhale 1-2 puffs into the lungs every 6 (six) hours as needed for wheezing. (Patient taking differently: Inhale 2 puffs into the lungs every 4 (four) hours as needed for wheezing. ) 1 Inhaler 12 09/14/2014 at Unknown time  . losartan (COZAAR) 25 MG tablet Take 25 mg by mouth daily.   09/14/2014 at Unknown time  . losartan (COZAAR) 50 MG tablet Take 1 tablet (50 mg total) by mouth daily. 90 tablet 3 09/14/2014 at Unknown time  . metoprolol succinate (TOPROL-XL) 25 MG 24 hr tablet Take 1 tablet (25 mg total) by mouth daily. 30 tablet 0 09/14/2014 at 800  . omeprazole (PRILOSEC) 20 MG capsule Take 20 mg by mouth daily.   09/14/2014 at Unknown time  . rivaroxaban (XARELTO) 20 MG TABS tablet Take 1 tablet (20 mg total) by mouth daily. 30 tablet 6 09/14/2014 at Unknown time  . torsemide (DEMADEX) 20 MG tablet Take 1 tablet (20 mg total) by mouth  2 (two) times daily. 120 tablet 3 09/14/2014 at Unknown time  . nitroGLYCERIN (NITROSTAT) 0.4 MG SL tablet Place 1 tablet (0.4 mg total) under the tongue every 5 (five) minutes as needed for chest pain. 25 tablet 2 unknown   Scheduled: . antiseptic oral rinse  7 mL Mouth Rinse BID  . atorvastatin  40 mg Oral q1800  . B-complex with vitamin C  1 tablet Oral Daily  . budesonide-formoterol  2 puff Inhalation BID  . digoxin  0.125 mg Oral Daily  . feeding supplement (ENSURE COMPLETE)  237 mL Oral BID BM  . gabapentin  300 mg Oral QHS  . ipratropium  0.5 mg Nebulization Q6H  . levalbuterol  0.63 mg Nebulization Q6H  . levofloxacin (LEVAQUIN) IV  750 mg Intravenous Q24H  . methylPREDNISolone (SOLU-MEDROL) injection  125 mg Intravenous Q6H  . metoprolol succinate  12.5 mg Oral QPM  . metoprolol succinate  25 mg Oral Daily  . pantoprazole  40 mg Oral Daily  . rivaroxaban  20 mg Oral Daily  . torsemide  20 mg Oral BID   Continuous:  RUE:[id number], HYDROcodone-acetaminophen, HYDROmorphone (DILAUDID) injection, ipratropium-albuterol, LORazepam, LORazepam, nitroGLYCERIN, ondansetron **OR** ondansetron (ZOFRAN) IV  Assesment: He has acute on chronic respiratory failure. He seems to have improved. He was admitted with a syncopal episode has what appears to be pneumonia and sepsis. He has acute on chronic combined systolic and diastolic heart failure. He has been in chronic atrial fibrillation and has an AICD device. He requests limited resuscitation but does not want to be intubated and placed on mechanical ventilation Active Problems:   Ischemic cardiomyopathy   Chronic atrial fibrillation   Chronic systolic heart failure   Pleural effusion   Acute on chronic combined systolic and diastolic CHF (congestive heart failure)   AICD (automatic cardioverter/defibrillator) present   Syncope    Plan: The difficulty has been that he has multifactorial respiratory failure with a significant component of  CHF. He's been hypotensive. Cardiology consultation  is pending. Chest x-ray and blood gas are pending. No change in treatments at this point    LOS: 4 days   Arisbeth Purrington L 09/18/2014, 7:29 AM

## 2014-09-18 NOTE — Care Management Utilization Note (Signed)
UR completed 

## 2014-09-18 NOTE — Clinical Social Work Note (Signed)
CSW received consult for COPD gold protocol. Pt does not meet criteria as he has not met 3 admissions in 6 months. CSW will sign off, but can be reconsulted if needed.  Benay Pike, Guin

## 2014-09-18 NOTE — Progress Notes (Addendum)
Patient ID: [name], male   DOB: 1950/09/24, 64 y.o.   MRN: [id number] [facility] PROGRESS NOTE  [name] VZC:[id number] DOB: 07/01/1951 DOA: 09/14/2014 PCP: Monico Blitz, MD  Brief narrative:    64 y.o. male with past medical history of chronic systolic heart failure, status post AICD, last 2 D ECHO in 08/16/13 with EF of 25%, chronic atrial fibrillation on anticoagulation with xarelto, hypertension who presented to [facility] status post syncopal event prior to the admission. Patient reported feeling lightheaded prior to syncope. There was no report of other prodromal symptoms prior to syncope such as chest pain or shortness of breath or palpitations.  On admission, blood pressure was 93/70, HR 41 - 109, RR 20, oxygen saturation 94% on room air. Blood work revealed mild leukocytosis of 10.7, potassium 2.8, troponin level 0.04, lactic acid 2.6, BNP 335. Chest x-ray showed CHF, right basilar opacity which could reflect pneumonia, left upper lobe pulmonary nodule. CT chest with contrast showed bilateral pleural effusion right greater than left, increased in size of posterior left upper lobe nodule measuring 16 x 13 mm and previously 13 x 11 mm, malignancy not entirely excluded. Patient was admitted for further evaluation of syncopal event. Cardiology was consulted. Pulmonary was consulted for possible biopsy of the pulmonary nodule. Recommended PET scan for evaluation prior to biopsy.  Patient went into respiratory distress on 09/16/2014, breathing around 22 breath per minute, more rhonchorous on clinical exam and he was in more pain. CXR showed worsening of interstitial edema and CHF, bilateral pleural effusions right greater than left. He was hypotensive and could not get IV lasix. He was on PO torsemide however. He was transferred to SDU 09/16/2014. He still remains in SDU as he requires BiPAP. Pulmonary is following.  Assessment/Plan:     Principal Problem: Acute respiratory  failure with hypoxia / COPD exacerbation / Bilateral pleural effusion right greater than left - Pt hypoxic on admission but improved with [place] oxygen support [facility] course is complicated with respiratory distress on 09/16/2014 for which reason pt transferred to SDU 09/16/2014. He now requires BiPAP. We will continue with daily ABG's. Most recent ABG shows CO2 in 80's range. Appreciate pulmonary following. CXR confirms worsening CHF since prior study. Cardiology is following and recommending continuing torsemide.  - Patient is also on solumedrol per pulmonary recommendations.  - Continue nebulizer treatments scheduled and PRN shortness of breath or wheezing  - Continue to monitor in SDU, continue BiPAP  Active Problems: Syncope - in patient with history of ICM and with ICD in situ. Also, pt was hypokalemic on admission which was supplemented; per cardio, hypokalemia may have triggered a VT causing ICD discharge and syncope  - potassium now WNL  Sepsis secondary to community acquired pneumonia / lactic acidosis / leukocytosis  - Sepsis criteria met on the admission with initial vitals signs that included hypotension, tachycardia, tachypnea, hypoxia (please note that oxygen saturation is 91% while patient is on a nasal cannula oxygen support). In addition patient had mild leukocytosis, 10.7, lactic acid elevated at 2.6. Evidence of pneumonia seen on chest x-ray. - Blood cultures to date show no growth.  - Continue Levaquin   Ischemic cardiomyopathy / mild troponin elevation  - Mild troponin elevation seen on the admission and persists, 0.04 --> 0.07 --> 0.06 - Likely demand ischemia from possible sepsis  Chronic atrial fibrillation - CHADS2/vasc score 4 - Continue anticoagulation with xarelto - Continue digoxin 0.125 mg daily and low dose metoprolol  Chronic combined systolic and diastolic heart failure / AICD (automatic cardioverter/defibrillator) present - 2-D echo in January 2015 showed  ejection fraction of 25% with grade 1 diastolic dysfunction. - We will continue digoxin, torsemide, metoprolol  - Continue daily weight and strict intake and output. Weight since past 72 hours: 185 lbs --> 186 lbs --> 184 lbs - Appreciate cardio following   Hypokalemia - Due to torsemide - Being supplemented   Pulmonary nodule - Left upper lobe nodule measuring 16 x 13 mm and previously 13 x 11 mm, malignancy not entirely excluded - Will need PET scan once discharge. For now acutely ill and we are doing daily CXR.   DVT Prophylaxis  - On anticoagulation with xarelto    Code Status: Partial code (no intubation but ok chest compressions, medications); i spoke with the pt on the morning of 09/16/2014 when he ws alert and awake about his code status prior to transfer to SDU.  Family Communication: family not at the bedside  Disposition Plan: acutely ill, on BiPAP, no stable for transfer to the floor  IV access:  Peripheral IV  Procedures and diagnostic studies:    Ct Chest W Contrast 09/14/2014  BILATERAL pleural effusions RIGHT greater than LEFT little changed with compressive atelectasis of the lower lobes.  Subsegmental atelectasis at anterior RIGHT middle lobe base.  Increase in size of a posterior LEFT upper lobe nodule measuring 16 x 13 mm previously 13 x 11 mm; malignancy is not excluded and tissue diagnosis is recommended.    Dg Chest Portable 1 View 09/14/2014    1. CHF. 2. Right basilar opacity which could reflect pneumonia or pleural fluid with atelectasis. Suggest two-view imaging when feasible. 3. Left upper lobe pulmonary nodule, reference chest CT 05/24/2014.    Dg Chest Port 1 View 09/17/2014   Persistent CHF with pulmonary edema and small RIGHT pleural effusion, little changed.     Dg Chest Port 1 View 09/18/2014 1. The appearance the chest suggests worsening congestive heart failure.    Medical Consultants:  Cardiology Pulmonary   Other Consultants:  None    IAnti-Infectives:   Levaquin 09/14/2014 -->    Leisa Lenz, MD  [facility] Pager [phone]  If 7PM-7AM, please contact night-coverage [email or web address] Password [facility] 09/18/2014, 3:50 PM   LOS: 4 days    HPI/Subjective: No acute overnight events.  Objective: Filed Vitals:   09/18/14 1300 09/18/14 1349 09/18/14 1400 09/18/14 1500  BP:    92/64  Pulse: 46  34 98  Temp:      TempSrc:      Resp: 33  22 21  Height:      Weight:      SpO2: 92% 93% 93% 92%    Intake/Output Summary (Last 24 hours) at 09/18/14 1550 Last data filed at 09/18/14 1246  Gross per 24 hour  Intake   1140 ml  Output   1225 ml  Net    -85 ml    Exam:   General: Pt is on BiPAP, sleeping this am  Cardiovascular: tachycardia, appreciate S1,S2  Respiratory: wheezing with diminished breath sounds   Abdomen: non tender, non distended, (+) BS  Extremities: bilateral pulses, no LE swelling  Neuro: Non focal   Data Reviewed: Basic Metabolic Panel:  Recent Labs Lab 09/14/14 1513 09/15/14 0616 09/15/14 0620 09/16/14 0013 09/16/14 0449 09/18/14 1036  NA 139  --  142 139 140 139  K 2.8*  --  3.6 4.5 4.4 4.5  CL 95*  --  95* 94* 94* 91*  CO2 39*  --  40* 41* 39* 44*  GLUCOSE 122*  --  108* 123* 127* 184*  BUN 13  --  _0 34*  CREATININE 0.94  --  0.84 1.16 1.03 1.01  CALCIUM 8.9  --  9.0 9.0 9.3 9.1  MG  --  1.8  --   --   --  2.3   Liver Function Tests:  Recent Labs Lab 09/15/14 0620  AST 23  ALT 20  ALKPHOS 79  BILITOT 0.9  PROT 7.2  ALBUMIN 3.8   No results for input(s): LIPASE, AMYLASE in the last 168 hours. No results for input(s): AMMONIA in the last 168 hours. CBC:  Recent Labs Lab 09/14/14 1513 09/15/14 0620 09/16/14 0449 09/18/14 1036  WBC 10.7* 10.5 14.5* 18.4*  NEUTROABS 7.7  --   --   --   HGB 14.7 14.5 14.1 14.3  HCT 44.3 45.5 45.1 45.4  MCV 96.9 99.8 101.3* 101.1*  PLT 204 200 185 177   Cardiac Enzymes:  Recent Labs Lab  09/14/14 1513 09/15/14 0057 09/15/14 0620  TROPONINI 0.04* 0.07* 0.06*   BNP: Invalid input(s): POCBNP CBG: No results for input(s): GLUCAP in the last 168 hours.  Recent Results (from the past 240 hour(s))  Blood culture (routine x 2)     Status: None (Preliminary result)   Collection Time: 09/14/14  3:13 PM  Result Value Ref Range Status   Specimen Description BLOOD LEFT ARM  Final   Special Requests BOTTLES DRAWN AEROBIC AND ANAEROBIC 6CC EACH  Final   Culture NO GROWTH 4 DAYS  Final   Report Status PENDING  Incomplete  Blood culture (routine x 2)     Status: None (Preliminary result)   Collection Time: 09/14/14  5:40 PM  Result Value Ref Range Status   Specimen Description BLOOD RIGHT HAND  Final   Special Requests BOTTLES DRAWN AEROBIC AND ANAEROBIC 6CC  Final   Culture NO GROWTH 4 DAYS  Final   Report Status PENDING  Incomplete  MRSA PCR Screening     Status: None   Collection Time: 09/16/14  9:55 AM  Result Value Ref Range Status   MRSA by PCR NEGATIVE NEGATIVE Final     Scheduled Meds: . antiseptic oral rinse  7 mL Mouth Rinse BID  . atorvastatin  40 mg Oral q1800  . B-complex with vitamin C  1 tablet Oral Daily  . budesonide-formoterol  2 puff Inhalation BID  . digoxin  0.125 mg Oral Daily  . feeding supplement (ENSURE COMPLETE)  237 mL Oral BID BM  . gabapentin  300 mg Oral QHS  . ipratropium  0.5 mg Nebulization Q6H  . levalbuterol  0.63 mg Nebulization Q6H  . levofloxacin (LEVAQUIN) IV  750 mg Intravenous Q24H  . methylPREDNISolone (SOLU-MEDROL) injection  125 mg Intravenous Q6H  . metoprolol succinate  12.5 mg Oral QPM  . metoprolol succinate  25 mg Oral Daily  . pantoprazole  40 mg Oral Daily  . rivaroxaban  20 mg Oral Daily  . torsemide  20 mg Oral BID   Continuous Infusions:

## 2014-09-18 NOTE — Progress Notes (Signed)
After I saw him his blood gas report became available. His PCO2 was back up to 80. We can adjust his BiPAP but he does not want to be intubated.

## 2014-09-18 NOTE — Progress Notes (Signed)
Patient ID: [name] LemonsReuben S [name], male   DOB: 09-15-1950, 64 y.o.   MRN: [id number]     Subjective:    Mild improvement in SOB  Objective:   Temp:  [97.1 F (36.2 C)-98.4 F (36.9 C)] 97.8 F (36.6 C) (02/22 0801) Pulse Rate:  [52-148] 95 (02/22 0900) Resp:  [15-32] 19 (02/22 0900) BP: (79-125)/(47-95) 96/67 mmHg (02/22 0800) SpO2:  [88 %-98 %] 92 % (02/22 0900) FiO2 (%):  [35 %-45 %] 35 % (02/22 0801) Weight:  [184 lb 8.4 oz (83.7 kg)] 184 lb 8.4 oz (83.7 kg) (02/22 0500) Last BM Date: 09/15/14  Filed Weights   09/16/14 1000 09/17/14 0504 09/18/14 0500  Weight: 185 lb 3 oz (84 kg) 186 lb 15.2 oz (84.8 kg) 184 lb 8.4 oz (83.7 kg)    Intake/Output Summary (Last 24 hours) at 09/18/14 0944 Last data filed at 09/18/14 0500  Gross per 24 hour  Intake    630 ml  Output   1775 ml  Net  -1145 ml    Telemetry: A sensed V-paced  Exam:  General: NAD  Resp: bilateral wheezing  Cardiac: RRR, no m/r/g, JVD to angle of jaw  WU:[id number]:abdomen soft, NT, ND  MSK: no LE edema  Neuro: no focal deficits  Psych: appropriate affect  Lab Results:  Basic Metabolic Panel:  Recent Labs Lab 09/15/14 0616 09/15/14 0620 09/16/14 0013 09/16/14 0449  NA  --  142 139 140  K  --  3.6 4.5 4.4  CL  --  95* 94* 94*  CO2  --  40* 41* 39*  GLUCOSE  --  108* 123* 127*  BUN  --  13 16 16   CREATININE  --  0.84 1.16 1.03  CALCIUM  --  9.0 9.0 9.3  MG 1.8  --   --   --     Liver Function Tests:  Recent Labs Lab 09/15/14 0620  AST 23  ALT 20  ALKPHOS 79  BILITOT 0.9  PROT 7.2  ALBUMIN 3.8    CBC:  Recent Labs Lab 09/14/14 1513 09/15/14 0620 09/16/14 0449  WBC 10.7* 10.5 14.5*  HGB 14.7 14.5 14.1  HCT 44.3 45.5 45.1  MCV 96.9 99.8 101.3*  PLT 204 200 185    Cardiac Enzymes:  Recent Labs Lab 09/14/14 1513 09/15/14 0057 09/15/14 0620  TROPONINI 0.04* 0.07* 0.06*    BNP:  Recent Labs  05/24/14 1001 05/30/14 0407 06/28/14 1202  PROBNP 2785.0* 4076.0* 392.0*     Coagulation: No results for input(s): INR in the last 168 hours.  ECG:   Medications:   Scheduled Medications: . antiseptic oral rinse  7 mL Mouth Rinse BID  . atorvastatin  40 mg Oral q1800  . B-complex with vitamin C  1 tablet Oral Daily  . budesonide-formoterol  2 puff Inhalation BID  . digoxin  0.125 mg Oral Daily  . feeding supplement (ENSURE COMPLETE)  237 mL Oral BID BM  . gabapentin  300 mg Oral QHS  . ipratropium  0.5 mg Nebulization Q6H  . levalbuterol  0.63 mg Nebulization Q6H  . levofloxacin (LEVAQUIN) IV  750 mg Intravenous Q24H  . methylPREDNISolone (SOLU-MEDROL) injection  125 mg Intravenous Q6H  . metoprolol succinate  12.5 mg Oral QPM  . metoprolol succinate  25 mg Oral Daily  . pantoprazole  40 mg Oral Daily  . rivaroxaban  20 mg Oral Daily  . torsemide  20 mg Oral BID     Infusions:  PRN Medications:  colchicine, HYDROcodone-acetaminophen, HYDROmorphone (DILAUDID) injection, ipratropium-albuterol, LORazepam, LORazepam, nitroGLYCERIN, ondansetron **OR** ondansetron (ZOFRAN) IV     Assessment/Plan    1. Acute on chronic systolic HF - 08/2014 echo LVEF 20-25%, restrictive diastolic function - ICM, hx of prior coronary stenting. Has [employer] ICD - negative 1.8 liters yesterday, negative 3.4 liters since admission. He is on oral torsemide  bid, no recent labs, will order.   2. Paroxysmal afib - from notes has failed multiple DCCVs despite treatment with amio - on xarelto  3. Syncope - multiple metabolic abnormalities on admit including lactic acid 2.66, K 2.8.  - ICD interrogation showed 3 discharges over the last 3 weeks for VT, of note significant hypokalemia on admission. K has been replaced, beta blocker uptitrated.   4. Lung mass - per pulmonary and primary team   5. Hypercapneic respiratory failure - ABG this AM 7.35/86/82/46 on FiO2 35. Management per pulmonary, disproportionate ventilation vs oxygenation defect suggests other  process than CHF. He is on bipap, from pulm note patient does not want intubation.   6. Sepsis - due to pneumonia, abx per primary team.       Dina Rich, M.D.

## 2014-09-18 NOTE — Progress Notes (Signed)
Held neb , mdi  for 20:00 as patient was nauseated. Placed back on BiPAP around 22:00 as patient appears to be over his nausea.

## 2014-09-18 NOTE — Progress Notes (Signed)
OT Cancellation Note  Patient Details Name: Lavella LemonsReuben S Withington MRN: [id number] DOB: September 13, 1950   Cancelled Treatment:    Reason Eval/Treat Not Completed: OT screened, no needs identified, will sign off. Patient's chart reviewed. Patient is functioning at baseline; independent with all ADLs. Patient does shows signs of confusion at time. Pt's only issue is difficulty breathing which he has been educated on energy conservation techniques. No OT follow needed.   Limmie PatriciaLaura Amberia Bayless, OTR/L,CBIS  [phone]  09/18/2014, 8:21 AM

## 2014-09-19 ENCOUNTER — Inpatient Hospital Stay (HOSPITAL_COMMUNITY): Payer: Medicare Other

## 2014-09-19 LAB — BASIC METABOLIC PANEL
Anion gap: 9 (ref 5–15)
BUN: 38 mg/dL — AB (ref 6–23)
CALCIUM: 9.2 mg/dL (ref 8.4–10.5)
CO2: 46 mmol/L (ref 19–32)
Chloride: 88 mmol/L — ABNORMAL LOW (ref 96–112)
Creatinine, Ser: 0.95 mg/dL (ref 0.50–1.35)
GFR calc Af Amer: >90 mL/min (ref >90)
GFR, EST NON AFRICAN AMERICAN: 85 mL/min — AB (ref >90)
Glucose, Bld: 183 mg/dL — ABNORMAL HIGH (ref 70–99)
Potassium: 4.1 mmol/L (ref 3.5–5.1)
Sodium: 143 mmol/L (ref 135–145)

## 2014-09-19 LAB — MAGNESIUM: MAGNESIUM: 2.4 mg/dL (ref 1.5–2.5)

## 2014-09-19 LAB — CULTURE, BLOOD (ROUTINE X 2)
Culture: NO GROWTH
Culture: NO GROWTH

## 2014-09-19 MED ORDER — HALOPERIDOL LACTATE 5 MG/ML IJ SOLN
1.0000 mg | Freq: Four times a day (QID) | INTRAMUSCULAR | Status: DC | PRN
  Administered 2014-09-19: 1 mg via INTRAVENOUS
  Filled 2014-09-19: qty 1

## 2014-09-19 MED ORDER — LORAZEPAM 2 MG/ML IJ SOLN
1.0000 mg | INTRAMUSCULAR | Status: DC | PRN
  Administered 2014-09-19 – 2014-09-21 (×9): 2 mg via INTRAVENOUS
  Filled 2014-09-19 (×10): qty 1

## 2014-09-19 MED ORDER — LORAZEPAM 2 MG/ML IJ SOLN
1.0000 mg | Freq: Once | INTRAMUSCULAR | Status: AC
  Administered 2014-09-19: 1 mg via INTRAVENOUS
  Filled 2014-09-19: qty 1

## 2014-09-19 MED ORDER — LORAZEPAM 2 MG/ML IJ SOLN: 1.0000 mg | INTRAMUSCULAR | Status: DC | PRN

## 2014-09-19 MED ORDER — NICOTINE 14 MG/24HR TD PT24
14.0000 mg | MEDICATED_PATCH | Freq: Every day | TRANSDERMAL | Status: DC
  Administered 2014-09-19 – 2014-09-21 (×3): 14 mg via TRANSDERMAL
  Filled 2014-09-19 (×3): qty 1

## 2014-09-19 MED ORDER — HALOPERIDOL LACTATE 5 MG/ML IJ SOLN
2.0000 mg | Freq: Four times a day (QID) | INTRAMUSCULAR | Status: DC | PRN
  Administered 2014-09-19 – 2014-09-21 (×4): 2 mg via INTRAVENOUS
  Filled 2014-09-19 (×5): qty 1

## 2014-09-19 NOTE — Progress Notes (Signed)
Inpatient Diabetes Program Recommendations  AACE/ADA: New Consensus Statement on Inpatient Glycemic Control (2013)  Target Ranges:  Prepandial:   less than 140 mg/dL      Peak postprandial:   less than 180 mg/dL (1-2 hours)      Critically ill patients:  140 - 180 mg/dL   Results for Lavella LemonsCAMPBELL, Kionte S (MRN [id number]) as of 09/19/2014 10:52  Ref. Range 09/18/2014 10:36 09/19/2014 04:31  Glucose Latest Range: 70-99 mg/dL 409184 (H) 811183 (H)   Diabetes history: No Outpatient Diabetes medications: NA Current orders for Inpatient glycemic control: None  Inpatient Diabetes Program Recommendations Correction (SSI): While inpatient and ordered steroids, may want to consider ordering CBGs with Novolog correction scale.  Note: Patient has no documented history of diabetes. Noted fasting glucose on labs this morning was 183 mg/dl. Patient is ordered Solumedrol 125 mg Q6H which is likely cause of hyperglycemia.   Thanks, Orlando PennerMarie Hector Taft, RN, MSN, CCRN, CDE Diabetes Coordinator Inpatient Diabetes Program [phone] (Team Pager) [phone] ([facility] office) [phone] [facility] office)

## 2014-09-19 NOTE — Progress Notes (Signed)
Patient ID: [name] LemonsReuben S [name], male   DOB: [birth year], 64 y.o.   MRN: [id number]     Subjective:    SOB improving.   Objective:   Temp:  [96.7 F (35.9 C)-98.5 F (36.9 C)] 97.5 F (36.4 C) (02/23 0730) Pulse Rate:  [34-115] 84 (02/23 0500) Resp:  [16-33] 17 (02/23 0500) BP: (88-111)/(61-79) 101/73 mmHg (02/23 0500) SpO2:  [86 %-99 %] 94 % (02/23 0713) FiO2 (%):  [35 %] 35 % (02/23 0713) Weight:  [182 lb 12.2 oz (82.9 kg)] 182 lb 12.2 oz (82.9 kg) (02/23 0500) Last BM Date: 09/17/14  Filed Weights   09/17/14 0504 09/18/14 0500 09/19/14 0500  Weight: 186 lb 15.2 oz (84.8 kg) 184 lb 8.4 oz (83.7 kg) 182 lb 12.2 oz (82.9 kg)    Intake/Output Summary (Last 24 hours) at 09/19/14 0833 Last data filed at 09/19/14 0500  Gross per 24 hour  Intake    830 ml  Output   2200 ml  Net  -1370 ml    Telemetry: SR. 16 beat run of NSVT.   Exam:  General: NAD  Resp:decreased breath sounds bilateral bases  Cardiac: RRR, no m/r/g, no JVD  WU:[id number]:abdomen soft, NT, ND  MSK: no LE edema  Neuro: no focal deficits  Psych: appropriate affect  Lab Results:  Basic Metabolic Panel:  Recent Labs Lab 09/15/14 0616  09/16/14 0449 09/18/14 1036 09/19/14 0431  NA  --   < > 140 139 143  K  --   < > 4.4 4.5 4.1  CL  --   < > 94* 91* 88*  CO2  --   < > 39* 44* 46*  GLUCOSE  --   < > 127* 184* 183*  BUN  --   < > 16 34* 38*  CREATININE  --   < > 1.03 1.01 0.95  CALCIUM  --   < > 9.3 9.1 9.2  MG 1.8  --   --  2.3 2.4  < > = values in this interval not displayed.  Liver Function Tests:  Recent Labs Lab 09/15/14 0620  AST 23  ALT 20  ALKPHOS 79  BILITOT 0.9  PROT 7.2  ALBUMIN 3.8    CBC:  Recent Labs Lab 09/15/14 0620 09/16/14 0449 09/18/14 1036  WBC 10.5 14.5* 18.4*  HGB 14.5 14.1 14.3  HCT 45.5 45.1 45.4  MCV 99.8 101.3* 101.1*  PLT 200 185 177    Cardiac Enzymes:  Recent Labs Lab 09/14/14 1513 09/15/14 0057 09/15/14 0620  TROPONINI 0.04* 0.07* 0.06*     BNP:  Recent Labs  05/24/14 1001 05/30/14 0407 06/28/14 1202  PROBNP 2785.0* 4076.0* 392.0*    Coagulation: No results for input(s): INR in the last 168 hours.  ECG:   Medications:   Scheduled Medications: . antiseptic oral rinse  7 mL Mouth Rinse BID  . atorvastatin  40 mg Oral q1800  . B-complex with vitamin C  1 tablet Oral Daily  . budesonide-formoterol  2 puff Inhalation BID  . digoxin  0.125 mg Oral Daily  . feeding supplement (ENSURE COMPLETE)  237 mL Oral BID BM  . gabapentin  300 mg Oral QHS  . ipratropium  0.5 mg Nebulization Q6H  . levalbuterol  0.63 mg Nebulization Q6H  . levofloxacin (LEVAQUIN) IV  750 mg Intravenous Q24H  . methylPREDNISolone (SOLU-MEDROL) injection  125 mg Intravenous Q6H  . metoprolol succinate  12.5 mg Oral QPM  . metoprolol succinate  25 mg Oral Daily  .  pantoprazole  40 mg Oral Daily  . rivaroxaban  20 mg Oral Daily  . torsemide  20 mg Oral BID     Infusions:     PRN Medications:  colchicine, HYDROcodone-acetaminophen, HYDROmorphone (DILAUDID) injection, ipratropium-albuterol, LORazepam, LORazepam, nitroGLYCERIN, ondansetron **OR** ondansetron (ZOFRAN) IV     Assessment/Plan    1. Acute on chronic systolic HF - 08/2014 echo LVEF 20-25%, restrictive diastolic function - ICM, hx of prior coronary stenting. Has [employer] ICD - negative 1.4 liters yesterday, negative 4.7 liters since admission. He is on oral torsemide  bid. Stable Cr, BUN trending up and with bicarb, trending down Cl. - CXR this AM with continued edema. Continue current diuretic, potentially decrease dosing tomorrow.   2. Paroxysmal afib - from notes has failed multiple DCCVs despite treatment with amio. Rate control with dig and metoprolol. - on xarelto  3. Syncope - multiple metabolic abnormalities on admit including lactic acid 2.66, K 2.8.  - ICD interrogation showed 3 discharges over the last 3 weeks for VT, of note significant hypokalemia on  admission. K has been replaced, beta blocker uptitrated.  - normal K and Mg on labs this AM  4. Lung mass - per pulmonary and primary team   5. Hypercapneic respiratory failure -  Management per pulmonary, disproportionate ventilation vs oxygenation defect suggests other process than CHF. He is on bipap, from pulm note patient does not want intubation.   6. Sepsis - due to pneumonia, abx per primary team.        Dina Rich, M.D.

## 2014-09-19 NOTE — Progress Notes (Signed)
Patient ID: [name], male   DOB: [birth year], 64 y.o.   MRN: [id number] [facility] PROGRESS NOTE  [name] WUJ:[id number] DOB: Mar 30, 1951 DOA: 09/14/2014 PCP: Monico Blitz, MD  Brief narrative:    64 y.o. male with past medical history of chronic systolic heart failure, status post AICD, last 2 D ECHO in 08/16/13 with EF of 25%, chronic atrial fibrillation on anticoagulation with xarelto, hypertension who presented to [facility] ED status post syncopal event prior to the admission. Patient reported feeling lightheaded prior to syncope. There was no report of other prodromal symptoms prior to syncope such as chest pain or shortness of breath or palpitations.  On admission, blood pressure was 93/70, HR 41 - 109, RR 20, oxygen saturation 94% on room air. Blood work revealed mild leukocytosis of 10.7, potassium 2.8, troponin level 0.04, lactic acid 2.6, BNP 335. Chest x-ray showed CHF, right basilar opacity which could reflect pneumonia, left upper lobe pulmonary nodule. CT chest with contrast showed bilateral pleural effusion right greater than left, increased in size of posterior left upper lobe nodule measuring 16 x 13 mm and previously 13 x 11 mm, malignancy not entirely excluded. Patient was admitted for further evaluation of syncopal event. Cardiology was consulted. Pulmonary was consulted for possible biopsy of the pulmonary nodule. Recommended PET scan for evaluation prior to biopsy.  Patient went into respiratory distress on 09/16/2014, more rhonchorous on clinical exam and in more pain. CXR showed worsening of interstitial edema and CHF, bilateral pleural effusions right greater than left. He was hypotensive and could not get IV lasix. He was on PO torsemide however. He was transferred to SDU 09/16/2014 since he required BiPAP for respiratory support. Patient has improved in past 24 hours. He had some overnight confusion episodes.    Assessment/Plan:     Principal Problem: Acute  respiratory failure with hypoxia / COPD exacerbation / Bilateral pleural effusion right greater than left - Patient was initially hypoxic but oxygen saturation improved with nasal cannula oxygen support. - Patient went into respiratory distress 09/16/2014 and required transfer to stepdown unit. Patient required BiPAP for respiratory support. The chest x-ray showed worsening CHF. Patient could not get IV Lasix because he was hypotensive but he was on by mouth torsemide. - Over past 24 hours, patient is improving. He is currently saturating 95% with nasal cannula oxygen support. - Chest x-ray on 09/19/2014 shows stable appearance of the chest consistent with CHF and pulmonary interstitial edema, retrocardiac atelectasis or pneumonia is stable. - Appreciate pulmonary following. - Current nebulizer treatment includes Xopenex and Atrovent every 6 hours scheduled. Patient is also on Solu-Medrol 125 mg IV every 6 hours per pulmonary recommendations. - We'll continue to monitor in step down unit for next 24 hours.  Active Problems: Syncope - Likely secondary to metabolic abnormalities on the admission including lactic acidosis and hypokalemia. - ICD was interrogated and it showed 3 discharges in past 3 weeks for VT; per cardiology hypokalemia may have triggered a VT causing ICD discharge and syncope  - Potassium supplemented and it remains within normal limit. - Appreciate cardiology following.  Sepsis secondary to community acquired pneumonia / lactic acidosis / leukocytosis  - Sepsis criteria met on the admission with initial vitals signs that included hypotension, tachycardia, tachypnea, hypoxia (please note that oxygen saturation is 91% while patient is on a nasal cannula oxygen support). In addition patient had mild leukocytosis, 10.7, lactic acid elevated at 2.6. Evidence of pneumonia seen on chest x-ray. - Blood cultures to  date show no growth.  - Continue Levaquin, day #6 today. Will continue for  1 more day and then stop.  Ischemic cardiomyopathy / mild troponin elevation  - Mild troponin elevation seen on the admission 0.04 - 0.07. Likely demand ischemia from possible sepsis - Appreciate cardiology following.   Chronic atrial fibrillation - CHADS2/vasc score 4 - Continue anticoagulation with xarelto - Continue digoxin 0.125 mg daily and metoprolol.  Chronic combined systolic and diastolic heart failure / AICD (automatic cardioverter/defibrillator) present - 2-D ECHO in 08/2014 with EF 20-25%, restrictive diastolic function. - Continue daily weight and strict intake and output. Negative 1.4 L in past 24 hours. Weight down in past 96 hours: 185 lbs --> 186 lbs --> 184 lbs --> 182 lbs - CXR this am showed continued edema. - Continue torsemide 20 mg twice daily  Hypokalemia - Due to torsemide. - Being supplemented.  Pulmonary nodule - Left upper lobe nodule measuring 16 x 13 mm and previously 13 x 11 mm, malignancy not entirely excluded - Will need PET scan once discharge. For now acutely ill and we are doing daily CXR.   DVT Prophylaxis  - On anticoagulation with xarelto    Code Status: Partial code (no intubation but ok chest compressions, medications); i spoke with the pt on the morning of 09/16/2014 when he ws alert and awake about his code status prior to transfer to SDU.  Family Communication: family at the bedside  Disposition Plan: continue to monitor in SDU for next 24 hours   IV access:  Peripheral IV  Procedures and diagnostic studies:    Ct Chest W Contrast 09/14/2014  BILATERAL pleural effusions RIGHT greater than LEFT little changed with compressive atelectasis of the lower lobes.  Subsegmental atelectasis at anterior RIGHT middle lobe base.  Increase in size of a posterior LEFT upper lobe nodule measuring 16 x 13 mm previously 13 x 11 mm; malignancy is not excluded and tissue diagnosis is recommended.    Dg Chest Portable 1 View 09/14/2014    1. CHF. 2. Right  basilar opacity which could reflect pneumonia or pleural fluid with atelectasis. Suggest two-view imaging when feasible. 3. Left upper lobe pulmonary nodule, reference chest CT 05/24/2014.    Dg Chest Port 1 View 09/17/2014   Persistent CHF with pulmonary edema and small RIGHT pleural effusion, little changed.     Dg Chest Port 1 View 09/18/2014 1. The appearance the chest suggests worsening congestive heart failure.    Dg Chest Port 1 View 09/19/2014 Stable appearance of the chest consistent with CHF, pulmonary interstitial edema, and right pleural effusion. Retrocardiac atelectasis or pneumonia is stable.  Medical Consultants:  Cardiology Pulmonary   Other Consultants:  None   IAnti-Infectives:   Levaquin 09/14/2014 -->     Leisa Lenz, MD  [facility] Pager [phone]  If 7PM-7AM, please contact night-coverage [email or web address] Password [email or web address] 09/19/2014, 9:10 AM   LOS: 5 days    HPI/Subjective: Confused overnight. Alert this am.   Objective: Filed Vitals:   09/19/14 0713 09/19/14 0730 09/19/14 0800 09/19/14 0900  BP:   123/73 105/77  Pulse:   94 95  Temp:  97.5 F (36.4 C)    TempSrc:  Oral    Resp:   18 23  Height:      Weight:      SpO2: 94%  98% 95%    Intake/Output Summary (Last 24 hours) at 09/19/14 0910 Last data filed at 09/19/14 0500  Gross per 24 hour  Intake  590 ml  Output   2200 ml  Net  -1610 ml    Exam:   General:  Pt is alert, looks better this am   Cardiovascular: Regular rate and rhythm, S1/S2 (+)  Respiratory: rhonchi appreciated, no wheezing, has [place] for oxygen support  Abdomen: Soft, non tender, non distended, bowel sounds present  Extremities: No edema, pulses palpable   Neuro: no focal deficits   Data Reviewed: Basic Metabolic Panel:  Recent Labs Lab 09/15/14 0616 09/15/14 0620 09/16/14 0013 09/16/14 0449 09/18/14 1036 09/19/14 0431  NA  --  142 139 140 139 143  K  --  3.6 4.5 4.4 4.5 4.1  CL  --  95* 94* 94*  91* 88*  CO2  --  40* 41* 39* 44* 46*  GLUCOSE  --  108* 123* 127* 184* 183*  BUN  --  _0 34* 38*  CREATININE  --  0.84 1.16 1.03 1.01 0.95  CALCIUM  --  9.0 9.0 9.3 9.1 9.2  MG 1.8  --   --   --  2.3 2.4   Liver Function Tests:  Recent Labs Lab 09/15/14 0620  AST 23  ALT 20  ALKPHOS 79  BILITOT 0.9  PROT 7.2  ALBUMIN 3.8   No results for input(s): LIPASE, AMYLASE in the last 168 hours. No results for input(s): AMMONIA in the last 168 hours. CBC:  Recent Labs Lab 09/14/14 1513 09/15/14 0620 09/16/14 0449 09/18/14 1036  WBC 10.7* 10.5 14.5* 18.4*  NEUTROABS 7.7  --   --   --   HGB 14.7 14.5 14.1 14.3  HCT 44.3 45.5 45.1 45.4  MCV 96.9 99.8 101.3* 101.1*  PLT 204 200 185 177   Cardiac Enzymes:  Recent Labs Lab 09/14/14 1513 09/15/14 0057 09/15/14 0620  TROPONINI 0.04* 0.07* 0.06*   BNP: Invalid input(s): POCBNP CBG: No results for input(s): GLUCAP in the last 168 hours.  Blood culture (routine x 2)     Status: None (Preliminary result)   Collection Time: 09/14/14  3:13 PM  Result Value Ref Range Status   Specimen Description BLOOD LEFT ARM  Final   Culture NO GROWTH 4 DAYS  Final   Report Status PENDING  Incomplete  Blood culture (routine x 2)     Status: None (Preliminary result)   Collection Time: 09/14/14  5:40 PM  Result Value Ref Range Status   Specimen Description BLOOD RIGHT HAND  Final   Culture NO GROWTH 4 DAYS  Final   Report Status PENDING  Incomplete  MRSA PCR Screening     Status: None   Collection Time: 09/16/14  9:55 AM  Result Value Ref Range Status   MRSA by PCR NEGATIVE NEGATIVE Final     Scheduled Meds: . atorvastatin  40 mg Oral q1800  . B-complex with vitamin   1 tablet Oral Daily  . budesonide-formoterol  2 puff Inhalation BID  . digoxin  0.125 mg Oral Daily  . feeding supplement (ENSURE COMPLETE)  237 mL Oral BID BM  . gabapentin  300 mg Oral QHS  . ipratropium  0.5 mg Nebulization Q6H  . levalbuterol  0.63 mg  Nebulization Q6H  . levofloxacin (LEVAQUIN) IV  750 mg Intravenous Q24H  . methylPREDNISolone   125 mg Intravenous Q6H  . metoprolol succinate  12.5 mg Oral QPM  . metoprolol succinate  25 mg Oral Daily  . pantoprazole  40 mg Oral Daily  . rivaroxaban  20 mg Oral Daily  .  torsemide  20 mg Oral BID

## 2014-09-19 NOTE — Progress Notes (Signed)
E-link RN & MD notified of 16 beat run of V-tach & updated on pt condition& vital signs remained stable

## 2014-09-19 NOTE — Progress Notes (Signed)
Subjective: He is confused but looks a little more comfortable.  Objective: Vital signs in last 24 hours: Temp:  [96.7 F (35.9 C)-98.5 F (36.9 C)] 97.5 F (36.4 C) (02/23 0730) Pulse Rate:  [34-115] 84 (02/23 0500) Resp:  [16-33] 17 (02/23 0500) BP: (88-111)/(61-79) 101/73 mmHg (02/23 0500) SpO2:  [86 %-99 %] 94 % (02/23 0713) FiO2 (%):  [35 %] 35 % (02/23 0713) Weight:  [82.9 kg (182 lb 12.2 oz)] 82.9 kg (182 lb 12.2 oz) (02/23 0500) Weight change: -0.8 kg (-1 lb 12.2 oz) Last BM Date: 09/17/14  Intake/Output from previous day: 02/22 0701 - 02/23 0700 In: 830 [P.O.:680; IV Piggyback:150] Out: 2200 [Urine:2200]  PHYSICAL EXAM General appearance: alert, mild distress and Confused Resp: rhonchi bilaterally Cardio: regular rate and rhythm, S1, S2 normal, no murmur, click, rub or gallop GI: soft, non-tender; bowel sounds normal; no masses,  no organomegaly Extremities: extremities normal, atraumatic, no cyanosis or edema  Lab Results:  Results for orders placed or performed during the hospital encounter of 09/14/14 (from the past 48 hour(s))  Blood gas, arterial     Status: Abnormal   Collection Time: 09/17/14 12:33 PM  Result Value Ref Range   FIO2 45.00 %   Delivery systems BILEVEL POSITIVE AIRWAY PRESSURE    Rate 8.0 resp/min   Inspiratory PAP 14    Expiratory PAP 6    pH, Arterial 7.433 7.350 - 7.450   pCO2 arterial 61.3 (HH) 35.0 - 45.0 mmHg    Comment: CRITICAL RESULT CALLED TO, READ BACK BY AND VERIFIED WITH: ERICA SPANGLER,RN AT 1240 BY VANESSA LAWSON,RRT,RRT ON2/21/2016    pO2, Arterial 66.2 (L) 80.0 - 100.0 mmHg   Bicarbonate 40.3 (H) 20.0 - 24.0 mEq/L   TCO2 35.6 0 - 100 mmol/L   Acid-Base Excess 15.1 (H) 0.0 - 2.0 mmol/L   O2 Saturation 93.8 %   Patient temperature 37.0    Collection site RIGHT RADIAL    Drawn by COLLECTED BY RT    Sample type ARTERIAL    Allens test (pass/fail) PASS PASS  Blood gas, arterial     Status: Abnormal   Collection Time:  09/18/14  7:56 AM  Result Value Ref Range   FIO2 35.00 %   pH, Arterial 7.349 (L) 7.350 - 7.450   pCO2 arterial 85.9 (HH) 35.0 - 45.0 mmHg    Comment: CRITICAL RESULT CALLED TO, READ BACK BY AND VERIFIED WITH: LAWRENCE HILTON, RN BY JESSICA LASLEY, RRT ON 09/18/2014 AT 0758    pO2, Arterial 81.6 80.0 - 100.0 mmHg   Bicarbonate 46.1 (H) 20.0 - 24.0 mEq/L   TCO2 40.9 0 - 100 mmol/L   Acid-Base Excess 19.5 (H) 0.0 - 2.0 mmol/L   O2 Saturation 95.2 %   Patient temperature 37.0    Collection site RIGHT RADIAL    Drawn by COLLECTED BY RT    Sample type ARTERIAL    Allens test (pass/fail) PASS PASS  Basic metabolic panel     Status: Abnormal   Collection Time: 09/18/14 10:36 AM  Result Value Ref Range   Sodium 139 135 - 145 mmol/L   Potassium 4.5 3.5 - 5.1 mmol/L   Chloride 91 (L) 96 - 112 mmol/L   CO2 44 (HH) 19 - 32 mmol/L    Comment: CRITICAL RESULT CALLED TO, READ BACK BY AND VERIFIED WITH: STONE,A AT 11:05AM ON 09/18/14 BY FESTERMAN,C    Glucose, Bld 184 (H) 70 - 99 mg/dL   BUN 34 (H) 6 - 23  mg/dL   Creatinine, Ser 7.49 0.50 - 1.35 mg/dL   Calcium 9.1 8.4 - 15.1 mg/dL   GFR calc non Af Amer 76 (L) >90 mL/min   GFR calc Af Amer 88 (L) >90 mL/min    Comment: (NOTE) The eGFR has been calculated using the CKD EPI equation. This calculation has not been validated in all clinical situations. eGFR's persistently <90 mL/min signify possible Chronic Kidney Disease.    Anion gap 4 (L) 5 - 15  Magnesium     Status: None   Collection Time: 09/18/14 10:36 AM  Result Value Ref Range   Magnesium 2.3 1.5 - 2.5 mg/dL  CBC     Status: Abnormal   Collection Time: 09/18/14 10:36 AM  Result Value Ref Range   WBC 18.4 (H) 4.0 - 10.5 K/uL   RBC 4.49 4.22 - 5.81 MIL/uL   Hemoglobin 14.3 13.0 - 17.0 g/dL   HCT 90.4 67.9 - 38.5 %   MCV 101.1 (H) 78.0 - 100.0 fL   MCH 31.8 26.0 - 34.0 pg   MCHC 31.5 30.0 - 36.0 g/dL   RDW 37.0 83.3 - 27.3 %   Platelets 177 150 - 400 K/uL    Comment:  SPECIMEN CHECKED FOR CLOTS  Basic metabolic panel     Status: Abnormal   Collection Time: 09/19/14  4:31 AM  Result Value Ref Range   Sodium 143 135 - 145 mmol/L   Potassium 4.1 3.5 - 5.1 mmol/L   Chloride 88 (L) 96 - 112 mmol/L   CO2 46 (HH) 19 - 32 mmol/L    Comment: CRITICAL RESULT CALLED TO, READ BACK BY AND VERIFIED WITH: MCLEOD,U AT 5:40AM ON 09/19/14 BY FESTERMAN,C    Glucose, Bld 183 (H) 70 - 99 mg/dL   BUN 38 (H) 6 - 23 mg/dL   Creatinine, Ser 8.09 0.50 - 1.35 mg/dL   Calcium 9.2 8.4 - 47.0 mg/dL   GFR calc non Af Amer 85 (L) >90 mL/min   GFR calc Af Amer >90 >90 mL/min    Comment: (NOTE) The eGFR has been calculated using the CKD EPI equation. This calculation has not been validated in all clinical situations. eGFR's persistently <90 mL/min signify possible Chronic Kidney Disease.    Anion gap 9 5 - 15  Magnesium     Status: None   Collection Time: 09/19/14  4:31 AM  Result Value Ref Range   Magnesium 2.4 1.5 - 2.5 mg/dL    ABGS  Recent Labs  05/31/37 0756  PHART 7.349*  PO2ART 81.6  TCO2 40.9  HCO3 46.1*   CULTURES Recent Results (from the past 240 hour(s))  Blood culture (routine x 2)     Status: None (Preliminary result)   Collection Time: 09/14/14  3:13 PM  Result Value Ref Range Status   Specimen Description BLOOD LEFT ARM  Final   Special Requests BOTTLES DRAWN AEROBIC AND ANAEROBIC 6CC EACH  Final   Culture NO GROWTH 4 DAYS  Final   Report Status PENDING  Incomplete  Blood culture (routine x 2)     Status: None (Preliminary result)   Collection Time: 09/14/14  5:40 PM  Result Value Ref Range Status   Specimen Description BLOOD RIGHT HAND  Final   Special Requests BOTTLES DRAWN AEROBIC AND ANAEROBIC 6CC  Final   Culture NO GROWTH 4 DAYS  Final   Report Status PENDING  Incomplete  MRSA PCR Screening     Status: None   Collection Time: 09/16/14  9:55 AM  Result Value Ref Range Status   MRSA by PCR NEGATIVE NEGATIVE Final    Comment:         The GeneXpert MRSA Assay (FDA approved for NASAL specimens only), is one component of a comprehensive MRSA colonization surveillance program. It is not intended to diagnose MRSA infection nor to guide or monitor treatment for MRSA infections.    Studies/Results: Dg Chest Port 1 View  09/19/2014   CLINICAL DATA:  Chronic CHF, respiratory failure, history of COPD  EXAM: PORTABLE CHEST - 1 VIEW  COMPARISON:  Portable chest x-ray September 18, 2014  FINDINGS: The lungs are reasonably well expanded. The interstitial markings remain diffusely increased. The retrocardiac density on the left persists. There is a small to moderate size right pleural effusion. The cardiac silhouette remains enlarged. The pulmonary vascularity remains engorged. The AICD is unchanged in position.  IMPRESSION: Stable appearance of the chest consistent with CHF, pulmonary interstitial edema, and right pleural effusion. Retrocardiac atelectasis or pneumonia is stable.   Electronically Signed   By: David  Martinique   On: 09/19/2014 08:08   Dg Chest Port 1 View  09/18/2014   CLINICAL DATA:  64 year old male with respiratory failure.  EXAM: PORTABLE CHEST - 1 VIEW  COMPARISON:  Chest x-ray 09/17/2014.  FINDINGS: Lung volumes are low. There is marked cephalization of the pulmonary vasculature, severe indistinctness of the interstitial markings, and extensive multifocal airspace disease throughout the lungs bilaterally suggestive of severe pulmonary edema. Moderate right and small left pleural effusions. Mild cardiomegaly. Upper mediastinal contours are within normal limits. Atherosclerosis in the thoracic aorta. Right-sided pacemaker/AICD with lead tips projecting over the expected location of the right atrium and right ventricular apex.  IMPRESSION: 1. The appearance the chest suggests worsening congestive heart failure, as above.   Electronically Signed   By: Vinnie Langton M.D.   On: 09/18/2014 08:16    Medications:  Prior to  Admission:  Prescriptions prior to admission  Medication Sig Dispense Refill Last Dose  . atorvastatin (LIPITOR) 40 MG tablet Take 1 tablet (40 mg total) by mouth daily. 30 tablet 3 09/14/2014 at Unknown time  . B Complex-C (B-COMPLEX WITH VITAMIN C) tablet Take 1 tablet by mouth daily.   09/14/2014 at Unknown time  . budesonide-formoterol (SYMBICORT) 160-4.5 MCG/ACT inhaler Inhale 2 puffs into the lungs 2 (two) times daily.   09/14/2014 at Unknown time  . colchicine 0.6 MG tablet Take 0.6 mg by mouth 2 (two) times daily as needed (for gout).    09/13/2014 at Unknown time  . digoxin (LANOXIN) 0.125 MG tablet Take 1 tablet (0.125 mg total) by mouth daily. 90 tablet 3 09/14/2014 at Unknown time  . gabapentin (NEURONTIN) 300 MG capsule Take 300 mg by mouth at bedtime.   09/13/2014 at Unknown time  . HYDROcodone-acetaminophen (NORCO/VICODIN) 5-325 MG per tablet Take 1 tablet by mouth 2 (two) times daily as needed for moderate pain.   09/13/2014 at Unknown time  . levalbuterol (XOPENEX HFA) 45 MCG/ACT inhaler Inhale 1-2 puffs into the lungs every 6 (six) hours as needed for wheezing. (Patient taking differently: Inhale 2 puffs into the lungs every 4 (four) hours as needed for wheezing. ) 1 Inhaler 12 09/14/2014 at Unknown time  . losartan (COZAAR) 25 MG tablet Take 25 mg by mouth daily.   09/14/2014 at Unknown time  . losartan (COZAAR) 50 MG tablet Take 1 tablet (50 mg total) by mouth daily. 90 tablet 3 09/14/2014 at Unknown time  .  metoprolol succinate (TOPROL-XL) 25 MG 24 hr tablet Take 1 tablet (25 mg total) by mouth daily. 30 tablet 0 09/14/2014 at 800  . omeprazole (PRILOSEC) 20 MG capsule Take 20 mg by mouth daily.   09/14/2014 at Unknown time  . rivaroxaban (XARELTO) 20 MG TABS tablet Take 1 tablet (20 mg total) by mouth daily. 30 tablet 6 09/14/2014 at Unknown time  . torsemide (DEMADEX) 20 MG tablet Take 1 tablet (20 mg total) by mouth 2 (two) times daily. 120 tablet 3 09/14/2014 at Unknown time  .  nitroGLYCERIN (NITROSTAT) 0.4 MG SL tablet Place 1 tablet (0.4 mg total) under the tongue every 5 (five) minutes as needed for chest pain. 25 tablet 2 unknown   Scheduled: . antiseptic oral rinse  7 mL Mouth Rinse BID  . atorvastatin  40 mg Oral q1800  . B-complex with vitamin C  1 tablet Oral Daily  . budesonide-formoterol  2 puff Inhalation BID  . digoxin  0.125 mg Oral Daily  . feeding supplement (ENSURE COMPLETE)  237 mL Oral BID BM  . gabapentin  300 mg Oral QHS  . ipratropium  0.5 mg Nebulization Q6H  . levalbuterol  0.63 mg Nebulization Q6H  . levofloxacin (LEVAQUIN) IV  750 mg Intravenous Q24H  . methylPREDNISolone (SOLU-MEDROL) injection  125 mg Intravenous Q6H  . metoprolol succinate  12.5 mg Oral QPM  . metoprolol succinate  25 mg Oral Daily  . pantoprazole  40 mg Oral Daily  . rivaroxaban  20 mg Oral Daily  . torsemide  20 mg Oral BID   Continuous:  OEH:[id number], HYDROcodone-acetaminophen, HYDROmorphone (DILAUDID) injection, ipratropium-albuterol, LORazepam, LORazepam, nitroGLYCERIN, ondansetron **OR** ondansetron (ZOFRAN) IV  Assesment: He was admitted with community-acquired pneumonia. 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 He has a mass in his lung that has increased in size. He has acute on chronic respiratory failure and I think it's a combination of pneumonia, COPD, and acute on chronic combined systolic and diastolic heart failure. He is very confused now so I think he's got a metabolic encephalopathy from his multiple illnesses but he does look better and seems to be more comfortable breathing. Active Problems:   Ischemic cardiomyopathy   Chronic atrial fibrillation   Chronic systolic heart failure   Pleural effusion   Acute on chronic combined systolic and diastolic CHF (congestive heart failure)   AICD (automatic cardioverter/defibrillator) present   Syncope   CAP (community acquired pneumonia)   Faintness    Plan: Continue current treatments. Thoracentesis of  the larger pleural effusion may be helpful but he is fully anticoagulated and he is continuing to have cardiac arrhythmias so it may not be reasonable to try him off anticoagulation. He does seem to be improving so for now I would hold off    LOS: 5 days   [name] L 09/19/2014, 8:19 AM

## 2014-09-20 ENCOUNTER — Inpatient Hospital Stay (HOSPITAL_COMMUNITY): Payer: Medicare Other

## 2014-09-20 DIAGNOSIS — I509 Heart failure, unspecified: Secondary | ICD-10-CM

## 2014-09-20 DIAGNOSIS — J9602 Acute respiratory failure with hypercapnia: Secondary | ICD-10-CM | POA: Diagnosis present

## 2014-09-20 LAB — BASIC METABOLIC PANEL
BUN: 45 mg/dL — ABNORMAL HIGH (ref 6–23)
CHLORIDE: 91 mmol/L — AB (ref 96–112)
CREATININE: 1.03 mg/dL (ref 0.50–1.35)
Calcium: 9.4 mg/dL (ref 8.4–10.5)
GFR calc Af Amer: 86 mL/min — ABNORMAL LOW (ref >90)
GFR calc non Af Amer: 74 mL/min — ABNORMAL LOW (ref >90)
Glucose, Bld: 186 mg/dL — ABNORMAL HIGH (ref 70–99)
Potassium: 4.4 mmol/L (ref 3.5–5.1)
Sodium: 147 mmol/L — ABNORMAL HIGH (ref 135–145)

## 2014-09-20 LAB — CBC
HCT: 50.8 % (ref 39.0–52.0)
Hemoglobin: 15.2 g/dL (ref 13.0–17.0)
MCH: 31.3 pg (ref 26.0–34.0)
MCHC: 29.9 g/dL — AB (ref 30.0–36.0)
MCV: 104.7 fL — ABNORMAL HIGH (ref 78.0–100.0)
PLATELETS: 185 10*3/uL (ref 150–400)
RBC: 4.85 MIL/uL (ref 4.22–5.81)
RDW: 13.4 % (ref 11.5–15.5)
WBC: 13.2 10*3/uL — ABNORMAL HIGH (ref 4.0–10.5)

## 2014-09-20 LAB — BLOOD GAS, ARTERIAL
Acid-Base Excess: 20.2 mmol/L — ABNORMAL HIGH (ref 0.0–2.0)
Bicarbonate: 47.5 mEq/L — ABNORMAL HIGH (ref 20.0–24.0)
DRAWN BY: 27407
O2 Content: 4 L/min
O2 SAT: 95.7 %
PATIENT TEMPERATURE: 37
PO2 ART: 88.3 mmHg (ref 80.0–100.0)
TCO2: 41.6 mmol/L (ref 0–100)
pCO2 arterial: 97.8 mmHg (ref 35.0–45.0)
pH, Arterial: 7.307 — ABNORMAL LOW (ref 7.350–7.450)

## 2014-09-20 LAB — MAGNESIUM: MAGNESIUM: 2.8 mg/dL — AB (ref 1.5–2.5)

## 2014-09-20 MED ORDER — METHYLPREDNISOLONE SODIUM SUCC 40 MG IJ SOLR
40.0000 mg | Freq: Four times a day (QID) | INTRAMUSCULAR | Status: DC
  Administered 2014-09-20 – 2014-09-22 (×9): 40 mg via INTRAVENOUS
  Filled 2014-09-20 (×9): qty 1

## 2014-09-20 MED ORDER — TORSEMIDE 20 MG PO TABS
20.0000 mg | ORAL_TABLET | Freq: Every day | ORAL | Status: DC
  Filled 2014-09-20: qty 1

## 2014-09-20 NOTE — Progress Notes (Signed)
Pt placed on BIPAP per ABG results.  Rt will continue to monitor.

## 2014-09-20 NOTE — Progress Notes (Signed)
Subjective: He is much more confused this morning. He was confused yesterday but he had a difficult night and required Ativan and Haldol. He is on nasal oxygen and his oxygenation is okay. No other new problems have been noted  Objective: Vital signs in last 24 hours: Temp:  [97.5 F (36.4 C)-98.2 F (36.8 C)] 97.6 F (36.4 C) (02/24 0400) Pulse Rate:  [35-129] 36 (02/24 0700) Resp:  [16-42] 22 (02/24 0700) BP: (80-123)/(50-96) 100/70 mmHg (02/24 0700) SpO2:  [91 %-99 %] 94 % (02/24 0700) Weight:  [81.3 kg (179 lb 3.7 oz)] 81.3 kg (179 lb 3.7 oz) (02/24 0500) Weight change: -1.6 kg (-3 lb 8.4 oz) Last BM Date: 09/15/14  Intake/Output from previous day: 02/23 0701 - 02/24 0700 In: 150 [IV Piggyback:150] Out: 1400 [Urine:1400]  PHYSICAL EXAM General appearance: He is sedated. He is confused. When he gets awake he gets agitated. Resp: rhonchi bilaterally Cardio: His heart rate is irregular and somewhat between 100 and 130. By monitor he has some paced beats. GI: soft, non-tender; bowel sounds normal; no masses,  no organomegaly Extremities: extremities normal, atraumatic, no cyanosis or edema  Lab Results:  Results for orders placed or performed during the hospital encounter of 09/14/14 (from the past 48 hour(s))  Blood gas, arterial     Status: Abnormal   Collection Time: 09/18/14  7:56 AM  Result Value Ref Range   FIO2 35.00 %   pH, Arterial 7.349 (L) 7.350 - 7.450   pCO2 arterial 85.9 (HH) 35.0 - 45.0 mmHg    Comment: CRITICAL RESULT CALLED TO, READ BACK BY AND VERIFIED WITH: LAWRENCE HILTON, RN BY JESSICA LASLEY, RRT ON 09/18/2014 AT 0758    pO2, Arterial 81.6 80.0 - 100.0 mmHg   Bicarbonate 46.1 (H) 20.0 - 24.0 mEq/L   TCO2 40.9 0 - 100 mmol/L   Acid-Base Excess 19.5 (H) 0.0 - 2.0 mmol/L   O2 Saturation 95.2 %   Patient temperature 37.0    Collection site RIGHT RADIAL    Drawn by COLLECTED BY RT    Sample type ARTERIAL    Allens test (pass/fail) PASS PASS  Basic  metabolic panel     Status: Abnormal   Collection Time: 09/18/14 10:36 AM  Result Value Ref Range   Sodium 139 135 - 145 mmol/L   Potassium 4.5 3.5 - 5.1 mmol/L   Chloride 91 (L) 96 - 112 mmol/L   CO2 44 (HH) 19 - 32 mmol/L    Comment: CRITICAL RESULT CALLED TO, READ BACK BY AND VERIFIED WITH: STONE,A AT 11:05AM ON 09/18/14 BY FESTERMAN,C    Glucose, Bld 184 (H) 70 - 99 mg/dL   BUN 34 (H) 6 - 23 mg/dL   Creatinine, Ser 1.01 0.50 - 1.35 mg/dL   Calcium 9.1 8.4 - 10.5 mg/dL   GFR calc non Af Amer 76 (L) >90 mL/min   GFR calc Af Amer 88 (L) >90 mL/min    Comment: (NOTE) The eGFR has been calculated using the CKD EPI equation. This calculation has not been validated in all clinical situations. eGFR's persistently <90 mL/min signify possible Chronic Kidney Disease.    Anion gap 4 (L) 5 - 15  Magnesium     Status: None   Collection Time: 09/18/14 10:36 AM  Result Value Ref Range   Magnesium 2.3 1.5 - 2.5 mg/dL  CBC     Status: Abnormal   Collection Time: 09/18/14 10:36 AM  Result Value Ref Range   WBC 18.4 (H) 4.0 -  10.5 K/uL   RBC 4.49 4.22 - 5.81 MIL/uL   Hemoglobin 14.3 13.0 - 17.0 g/dL   HCT 45.4 39.0 - 52.0 %   MCV 101.1 (H) 78.0 - 100.0 fL   MCH 31.8 26.0 - 34.0 pg   MCHC 31.5 30.0 - 36.0 g/dL   RDW 13.6 11.5 - 15.5 %   Platelets 177 150 - 400 K/uL    Comment: SPECIMEN CHECKED FOR CLOTS  Basic metabolic panel     Status: Abnormal   Collection Time: 09/19/14  4:31 AM  Result Value Ref Range   Sodium 143 135 - 145 mmol/L   Potassium 4.1 3.5 - 5.1 mmol/L   Chloride 88 (L) 96 - 112 mmol/L   CO2 46 (HH) 19 - 32 mmol/L    Comment: CRITICAL RESULT CALLED TO, READ BACK BY AND VERIFIED WITH: MCLEOD,U AT 5:40AM ON 09/19/14 BY FESTERMAN,C    Glucose, Bld 183 (H) 70 - 99 mg/dL   BUN 38 (H) 6 - 23 mg/dL   Creatinine, Ser 0.95 0.50 - 1.35 mg/dL   Calcium 9.2 8.4 - 10.5 mg/dL   GFR calc non Af Amer 85 (L) >90 mL/min   GFR calc Af Amer >90 >90 mL/min    Comment: (NOTE) The  eGFR has been calculated using the CKD EPI equation. This calculation has not been validated in all clinical situations. eGFR's persistently <90 mL/min signify possible Chronic Kidney Disease.    Anion gap 9 5 - 15  Magnesium     Status: None   Collection Time: 09/19/14  4:31 AM  Result Value Ref Range   Magnesium 2.4 1.5 - 2.5 mg/dL  Basic metabolic panel     Status: Abnormal   Collection Time: 09/20/14  4:40 AM  Result Value Ref Range   Sodium 147 (H) 135 - 145 mmol/L   Potassium 4.4 3.5 - 5.1 mmol/L   Chloride 91 (L) 96 - 112 mmol/L   CO2 >50 (HH) 19 - 32 mmol/L    Comment: CRITICAL RESULT CALLED TO, READ BACK BY AND VERIFIED WITH: DANIEL,J AT 6:05AM ON 09/20/14 BY FESTERMAN,C    Glucose, Bld 186 (H) 70 - 99 mg/dL   BUN 45 (H) 6 - 23 mg/dL   Creatinine, Ser 1.03 0.50 - 1.35 mg/dL   Calcium 9.4 8.4 - 10.5 mg/dL   GFR calc non Af Amer 74 (L) >90 mL/min   GFR calc Af Amer 86 (L) >90 mL/min    Comment: (NOTE) The eGFR has been calculated using the CKD EPI equation. This calculation has not been validated in all clinical situations. eGFR's persistently <90 mL/min signify possible Chronic Kidney Disease.    Anion gap NOT CALCULATED 5 - 15  Magnesium     Status: Abnormal   Collection Time: 09/20/14  4:40 AM  Result Value Ref Range   Magnesium 2.8 (H) 1.5 - 2.5 mg/dL  CBC     Status: Abnormal   Collection Time: 09/20/14  4:40 AM  Result Value Ref Range   WBC 13.2 (H) 4.0 - 10.5 K/uL   RBC 4.85 4.22 - 5.81 MIL/uL   Hemoglobin 15.2 13.0 - 17.0 g/dL   HCT 50.8 39.0 - 52.0 %   MCV 104.7 (H) 78.0 - 100.0 fL   MCH 31.3 26.0 - 34.0 pg   MCHC 29.9 (L) 30.0 - 36.0 g/dL   RDW 13.4 11.5 - 15.5 %   Platelets 185 150 - 400 K/uL    ABGS  Recent Labs  09/18/14 0756  PHART 7.349*  PO2ART 81.6  TCO2 40.9  HCO3 46.1*   CULTURES Recent Results (from the past 240 hour(s))  Blood culture (routine x 2)     Status: None   Collection Time: 09/14/14  3:13 PM  Result Value Ref Range  Status   Specimen Description BLOOD LEFT ARM DRAWN BY RN  Final   Special Requests BOTTLES DRAWN AEROBIC AND ANAEROBIC Carmen  Final   Culture NO GROWTH 5 DAYS  Final   Report Status 09/19/2014 FINAL  Final  Blood culture (routine x 2)     Status: None   Collection Time: 09/14/14  5:40 PM  Result Value Ref Range Status   Specimen Description BLOOD RIGHT HAND  Final   Special Requests BOTTLES DRAWN AEROBIC AND ANAEROBIC 6CC  Final   Culture NO GROWTH 5 DAYS  Final   Report Status 09/19/2014 FINAL  Final  MRSA PCR Screening     Status: None   Collection Time: 09/16/14  9:55 AM  Result Value Ref Range Status   MRSA by PCR NEGATIVE NEGATIVE Final    Comment:        The GeneXpert MRSA Assay (FDA approved for NASAL specimens only), is one component of a comprehensive MRSA colonization surveillance program. It is not intended to diagnose MRSA infection nor to guide or monitor treatment for MRSA infections.    Studies/Results: Dg Chest Port 1 View  09/19/2014   CLINICAL DATA:  Chronic CHF, respiratory failure, history of COPD  EXAM: PORTABLE CHEST - 1 VIEW  COMPARISON:  Portable chest x-ray September 18, 2014  FINDINGS: The lungs are reasonably well expanded. The interstitial markings remain diffusely increased. The retrocardiac density on the left persists. There is a small to moderate size right pleural effusion. The cardiac silhouette remains enlarged. The pulmonary vascularity remains engorged. The AICD is unchanged in position.  IMPRESSION: Stable appearance of the chest consistent with CHF, pulmonary interstitial edema, and right pleural effusion. Retrocardiac atelectasis or pneumonia is stable.   Electronically Signed   By: David  Martinique   On: 09/19/2014 08:08   Dg Chest Port 1 View  09/18/2014   CLINICAL DATA:  64 year old male with respiratory failure.  EXAM: PORTABLE CHEST - 1 VIEW  COMPARISON:  Chest x-ray 09/17/2014.  FINDINGS: Lung volumes are low. There is marked cephalization  of the pulmonary vasculature, severe indistinctness of the interstitial markings, and extensive multifocal airspace disease throughout the lungs bilaterally suggestive of severe pulmonary edema. Moderate right and small left pleural effusions. Mild cardiomegaly. Upper mediastinal contours are within normal limits. Atherosclerosis in the thoracic aorta. Right-sided pacemaker/AICD with lead tips projecting over the expected location of the right atrium and right ventricular apex.  IMPRESSION: 1. The appearance the chest suggests worsening congestive heart failure, as above.   Electronically Signed   By: Vinnie Langton M.D.   On: 09/18/2014 08:16    Medications:  Prior to Admission:  Prescriptions prior to admission  Medication Sig Dispense Refill Last Dose  . atorvastatin (LIPITOR) 40 MG tablet Take 1 tablet (40 mg total) by mouth daily. 30 tablet 3 09/14/2014 at Unknown time  . B Complex-C (B-COMPLEX WITH VITAMIN C) tablet Take 1 tablet by mouth daily.   09/14/2014 at Unknown time  . budesonide-formoterol (SYMBICORT) 160-4.5 MCG/ACT inhaler Inhale 2 puffs into the lungs 2 (two) times daily.   09/14/2014 at Unknown time  . colchicine 0.6 MG tablet Take 0.6 mg by mouth 2 (two) times daily as needed (for gout).  09/13/2014 at Unknown time  . digoxin (LANOXIN) 0.125 MG tablet Take 1 tablet (0.125 mg total) by mouth daily. 90 tablet 3 09/14/2014 at Unknown time  . gabapentin (NEURONTIN) 300 MG capsule Take 300 mg by mouth at bedtime.   09/13/2014 at Unknown time  . HYDROcodone-acetaminophen (NORCO/VICODIN) 5-325 MG per tablet Take 1 tablet by mouth 2 (two) times daily as needed for moderate pain.   09/13/2014 at Unknown time  . levalbuterol (XOPENEX HFA) 45 MCG/ACT inhaler Inhale 1-2 puffs into the lungs every 6 (six) hours as needed for wheezing. (Patient taking differently: Inhale 2 puffs into the lungs every 4 (four) hours as needed for wheezing. ) 1 Inhaler 12 09/14/2014 at Unknown time  . losartan (COZAAR)  25 MG tablet Take 25 mg by mouth daily.   09/14/2014 at Unknown time  . losartan (COZAAR) 50 MG tablet Take 1 tablet (50 mg total) by mouth daily. 90 tablet 3 09/14/2014 at Unknown time  . metoprolol succinate (TOPROL-XL) 25 MG 24 hr tablet Take 1 tablet (25 mg total) by mouth daily. 30 tablet 0 09/14/2014 at 800  . omeprazole (PRILOSEC) 20 MG capsule Take 20 mg by mouth daily.   09/14/2014 at Unknown time  . rivaroxaban (XARELTO) 20 MG TABS tablet Take 1 tablet (20 mg total) by mouth daily. 30 tablet 6 09/14/2014 at Unknown time  . torsemide (DEMADEX) 20 MG tablet Take 1 tablet (20 mg total) by mouth 2 (two) times daily. 120 tablet 3 09/14/2014 at Unknown time  . nitroGLYCERIN (NITROSTAT) 0.4 MG SL tablet Place 1 tablet (0.4 mg total) under the tongue every 5 (five) minutes as needed for chest pain. 25 tablet 2 unknown   Scheduled: . antiseptic oral rinse  7 mL Mouth Rinse BID  . atorvastatin  40 mg Oral q1800  . B-complex with vitamin C  1 tablet Oral Daily  . budesonide-formoterol  2 puff Inhalation BID  . digoxin  0.125 mg Oral Daily  . feeding supplement (ENSURE COMPLETE)  237 mL Oral BID BM  . gabapentin  300 mg Oral QHS  . ipratropium  0.5 mg Nebulization Q6H  . levalbuterol  0.63 mg Nebulization Q6H  . levofloxacin (LEVAQUIN) IV  750 mg Intravenous Q24H  . methylPREDNISolone (SOLU-MEDROL) injection  125 mg Intravenous Q6H  . metoprolol succinate  12.5 mg Oral QPM  . metoprolol succinate  25 mg Oral Daily  . nicotine  14 mg Transdermal Daily  . pantoprazole  40 mg Oral Daily  . rivaroxaban  20 mg Oral Daily  . 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 He has acute on chronic combined systolic and diastolic heart failure. He has  ischemic cardiomyopathy, chronic atrial fibrillation and has had syncope. He has what appears to be community-acquired pneumonia. He was initially admitted after the syncopal episode and although he was short of breath he did not appear to be in acute distress. Later he developed increasing problems with shortness of breath and appeared to be related more to his heart failure. Since then he's developed respiratory failure with PCO2 markedly elevated and he's required BiPAP. He's become increasingly confused and agitated. He has bilateral pleural effusions related to his heart failure. Active Problems:   Ischemic cardiomyopathy   Chronic atrial fibrillation   Chronic systolic heart failure   Pleural effusion   Acute  on chronic combined systolic and diastolic CHF (congestive heart failure)   AICD (automatic cardioverter/defibrillator) present   Syncope   CAP (community acquired pneumonia)   Faintness    Plan: Continue current treatments. Check chest x-ray and blood gas. He is on IV steroids and IV antibiotics and inhaled bronchodilators.    LOS: 6 days   [name] L 09/20/2014, 7:35 AM

## 2014-09-20 NOTE — Progress Notes (Signed)
Progress Note   ARON INGE ERX:[id number] DOB: 1950/10/03 DOA: 09/14/2014 PCP: Monico Blitz, MD   Brief Narrative:   [name] is an 64 y.o. male with a PMH of chronic systolic heart failure, status post AICD, last 2 D ECHO in 08/16/13 with EF of 25%, chronic atrial fibrillation on anticoagulation with xarelto, hypertension who was admitted 09/14/14 for evaluation of syncope. Patient reported feeling lightheaded prior to syncopal event. There was no report of other prodromal symptoms prior to syncope such as chest pain or shortness of breath or palpitations.  On admission, blood pressure was 93/70, HR 41 - 109, RR 20, oxygen saturation 94% on room air. Blood work revealed mild leukocytosis of 10.7, potassium 2.8, troponin level 0.04, lactic acid 2.6, BNP 335. Chest x-ray showed CHF, right basilar opacity, consistent with possible pneumonia, left upper lobe pulmonary nodule. CT chest with contrast showed bilateral pleural effusion right greater than left, increased in size of posterior left upper lobe nodule measuring 16 x 13 mm and previously 13 x 11 mm, malignancy not entirely excluded. Patient was admitted for further evaluation of syncopal event. Cardiology was consulted. Pulmonary was consulted for possible biopsy of the pulmonary nodule. Recommended PET scan for evaluation prior to biopsy.  Patient went into respiratory distress on 09/16/2014, more rhonchorous on clinical exam and in more pain. CXR showed worsening of interstitial edema and CHF, bilateral pleural effusions right greater than left. He was hypotensive and could not get IV lasix. He was on PO torsemide however. He was transferred to SDU 09/16/2014 since he required BiPAP for respiratory support. Patient has improved in past 24 hours. He had some overnight confusion episodes.    Assessment/Plan:   Principal Problem: Acute respiratory failure with hypoxia and hypercarbia / COPD exacerbation / Bilateral pleural  effusion right greater than left - Patient went into respiratory distress 09/16/2014 and required Bipap and transfer to stepdown unit. Chest x-ray showed worsening CHF. Patient could not get IV Lasix because he was hypotensive but he was on by mouth torsemide. - Chest x-ray on 09/19/2014 showed stable appearance of the chest consistent with CHF and pulmonary interstitial edema, retrocardiac atelectasis or pneumonia is stable. - Appreciate pulmonary following.  ABG shows hypercarbia with a PC02 of 97.  Resume Bipap. - Current nebulizer treatment includes Xopenex and Atrovent every 6 hours scheduled. Patient is also on Solu-Medrol 40 mg IV every 6 hours per pulmonary recommendations.  Active Problems: Syncope - Likely secondary to metabolic abnormalities on the admission including lactic acidosis and hypokalemia. - ICD was interrogated and it showed 3 discharges in past 3 weeks for VT; per cardiology hypokalemia may have triggered a VT causing ICD discharge and syncope.  - Potassium supplemented and it remains within normal limits. - Appreciate cardiology following.  Sepsis secondary to community acquired pneumonia / lactic acidosis / leukocytosis  - Sepsis criteria met on the admission with initial vitals signs that included hypotension, tachycardia, tachypnea, hypoxia (please note that oxygen saturation is 91% while patient is on a nasal cannula oxygen support). In addition patient had mild leukocytosis, 10.7, lactic acid elevated at 2.6. Evidence of pneumonia seen on chest x-ray. - Blood cultures to date show no growth.  - Continue Levaquin, day #7 today. Stop after today's dose given.  Ischemic cardiomyopathy / mild troponin elevation  - Mild troponin elevation seen on the admission 0.04 - 0.07. Likely demand ischemia from possible sepsis. - Appreciate cardiology following.   Chronic atrial fibrillation - CHADS2/vasc  score 4. - Continue anticoagulation with xarelto. - Continue digoxin  0.125 mg daily and metoprolol.  Chronic combined systolic and diastolic heart failure / AICD (automatic cardioverter/defibrillator) present - 2-D ECHO in 08/2014 with EF 20-25%, restrictive diastolic function. - Continue daily weight and strict intake and output. Negative 1.2 L in past 24 hours. Weight down in past 96 hours: 185 lbs --> 186 lbs --> 184 lbs --> 182 lbs--->179 lbs. - Continue torsemide 20 mg twice daily.  Hypokalemia - Due to torsemide. - Being supplemented.  Pulmonary nodule - Left upper lobe nodule measuring 16 x 13 mm and previously 13 x 11 mm, malignancy not entirely excluded. - Will need PET scan once discharge. For now acutely ill and we are doing daily CXR.  DVT Prophylaxis  - On anticoagulation with xarelto    Code Status: Partial code (no intubation but ok chest compressions, medications)  Family Communication: No family at the bedside. Disposition: Continue SDU level of care.   From home.  ? Higher level of care needed at D/C.  IV Access:    Peripheral IV   Procedures and diagnostic studies:   Ct Chest W Contrast 09/14/2014 BILATERAL pleural effusions RIGHT greater than LEFT little changed with compressive atelectasis of the lower lobes. Subsegmental atelectasis at anterior RIGHT middle lobe base. Increase in size of a posterior LEFT upper lobe nodule measuring 16 x 13 mm previously 13 x 11 mm; malignancy is not excluded and tissue diagnosis is recommended.   Dg Chest Portable 1 View 09/14/2014 1. CHF. 2. Right basilar opacity which could reflect pneumonia or pleural fluid with atelectasis. Suggest two-view imaging when feasible. 3. Left upper lobe pulmonary nodule, reference chest CT 05/24/2014.   Dg Chest Port 1 View 09/17/2014 Persistent CHF with pulmonary edema and small RIGHT pleural effusion, little changed.   Dg Chest Port 1 View 09/18/2014 1. The appearance the chest suggests worsening congestive heart failure.   Dg Chest Port 1  View 09/19/2014 Stable appearance of the chest consistent with CHF, pulmonary interstitial edema, and right pleural effusion. Retrocardiac atelectasis or pneumonia is stable.  Dg Chest Port 1 View 09/20/2014: Bilateral airspace opacities, worsening on the right. Favor worsening asymmetric edema. Small to moderate right pleural effusion.    Medical Consultants:    Alonza Bogus, MD, Pulmonary  Herminio Commons, MD, Cardiology  Anti-Infectives:    Levaquin 09/14/14--->09/20/14  Subjective:   Ernesto Rutherford is restless and confused.  Non-verbal.   Objective:    Filed Vitals:   09/20/14 0500 09/20/14 0700 09/20/14 0800 09/20/14 0833  BP:  100/70 89/53   Pulse:  36 50   Temp:      TempSrc:      Resp:  22 24   Height:      Weight: 81.3 kg (179 lb 3.7 oz)     SpO2:  94% 96% 96%    Intake/Output Summary (Last 24 hours) at 09/20/14 0836 Last data filed at 09/20/14 1610  Gross per 24 hour  Intake    150 ml  Output   1400 ml  Net  -1250 ml    Exam: Gen:  Restless Cardiovascular:  Tachy/reg, No M/R/G, +JVD Respiratory:  Lungs diminished with poor air movement. Gastrointestinal:  Abdomen soft, NT/ND, + BS Extremities:  No C/E/C   Data Reviewed:    Labs: Basic Metabolic Panel:  Recent Labs Lab 09/15/14 0616  09/16/14 0013 09/16/14 0449 09/18/14 1036 09/19/14 0431 09/20/14 0440  NA  --   < >  139 140 139 143 147*  K  --   < > 4.5 4.4 4.5 4.1 4.4  CL  --   < > 94* 94* 91* 88* 91*  CO2  --   < > 41* 39* 44* 46* >50*  GLUCOSE  --   < > 123* 127* 184* 183* 186*  BUN  --   < > 16 16 34* 38* 45*  CREATININE  --   < > 1.16 1.03 1.01 0.95 1.03  CALCIUM  --   < > 9.0 9.3 9.1 9.2 9.4  MG 1.8  --   --   --  2.3 2.4 2.8*  < > = values in this interval not displayed. GFR Estimated Creatinine Clearance: 73 mL/min (by C-G formula based on Cr of 1.03). Liver Function Tests:  Recent Labs Lab 09/15/14 0620  AST 23  ALT 20  ALKPHOS 79  BILITOT 0.9  PROT 7.2    ALBUMIN 3.8   CBC:  Recent Labs Lab 09/14/14 1513 09/15/14 0620 09/16/14 0449 09/18/14 1036 09/20/14 0440  WBC 10.7* 10.5 14.5* 18.4* 13.2*  NEUTROABS 7.7  --   --   --   --   HGB 14.7 14.5 14.1 14.3 15.2  HCT 44.3 45.5 45.1 45.4 50.8  MCV 96.9 99.8 101.3* 101.1* 104.7*  PLT 204 200 185 177 185   Cardiac Enzymes:  Recent Labs Lab 09/14/14 1513 09/15/14 0057 09/15/14 0620  TROPONINI 0.04* 0.07* 0.06*   BNP (last 3 results)  Recent Labs  05/24/14 1001 05/30/14 0407 06/28/14 1202  PROBNP 2785.0* 4076.0* 392.0*   Sepsis Labs:  Recent Labs Lab 09/14/14 1723 09/15/14 0620 09/16/14 0449 09/18/14 1036 09/20/14 0440  WBC  --  10.5 14.5* 18.4* 13.2*  LATICACIDVEN 2.66*  --   --   --   --    Microbiology Recent Results (from the past 240 hour(s))  Blood culture (routine x 2)     Status: None   Collection Time: 09/14/14  3:13 PM  Result Value Ref Range Status   Specimen Description BLOOD LEFT ARM DRAWN BY RN  Final   Special Requests BOTTLES DRAWN AEROBIC AND ANAEROBIC 6CC EACH  Final   Culture NO GROWTH 5 DAYS  Final   Report Status 09/19/2014 FINAL  Final  Blood culture (routine x 2)     Status: None   Collection Time: 09/14/14  5:40 PM  Result Value Ref Range Status   Specimen Description BLOOD RIGHT HAND  Final   Special Requests BOTTLES DRAWN AEROBIC AND ANAEROBIC 6CC  Final   Culture NO GROWTH 5 DAYS  Final   Report Status 09/19/2014 FINAL  Final  MRSA PCR Screening     Status: None   Collection Time: 09/16/14  9:55 AM  Result Value Ref Range Status   MRSA by PCR NEGATIVE NEGATIVE Final    Comment:        The GeneXpert MRSA Assay (FDA approved for NASAL specimens only), is one component of a comprehensive MRSA colonization surveillance program. It is not intended to diagnose MRSA infection nor to guide or monitor treatment for MRSA infections.      Medications:   . antiseptic oral rinse  7 mL Mouth Rinse BID  . atorvastatin  40 mg Oral  q1800  . B-complex with vitamin C  1 tablet Oral Daily  . budesonide-formoterol  2 puff Inhalation BID  . digoxin  0.125 mg Oral Daily  . feeding supplement (ENSURE COMPLETE)  237 mL Oral BID  BM  . gabapentin  300 mg Oral QHS  . ipratropium  0.5 mg Nebulization Q6H  . levalbuterol  0.63 mg Nebulization Q6H  . levofloxacin (LEVAQUIN) IV  750 mg Intravenous Q24H  . methylPREDNISolone (SOLU-MEDROL) injection  40 mg Intravenous Q6H  . metoprolol succinate  12.5 mg Oral QPM  . metoprolol succinate  25 mg Oral Daily  . nicotine  14 mg Transdermal Daily  . pantoprazole  40 mg Oral Daily  . rivaroxaban  20 mg Oral Daily  . torsemide  20 mg Oral BID   Continuous Infusions:   Time spent: 35 minutes.  The patient is medically complex and requires high complexity decision making.     LOS: 6 days   [facility] Pager [phone]. If unable to reach me by pager, please call my cell phone at [phone].  *Please refer to [email or web address], password [email or web address] to get updated schedule on who will round on this patient, as hospitalists switch teams weekly. If 7PM-7AM, please contact night-coverage at [email or web address], password [email or web address] for any overnight needs.  09/20/2014, 8:36 AM

## 2014-09-20 NOTE — Progress Notes (Addendum)
CRITICAL VALUE ALERT  Critical value received: PCO2 97.8  Date of notification: 09/20/14  Time of notification:  0843  Critical value read back:yes  Nurse who received alert: E.Tonnia Bardin RN  MD notified (1st page):  Dr. Darnelle Catalanama  Time of first page:  [phone]  MD notified (2nd page):  Time of second page:  Responding MD: Dr. Darnelle Catalanama   Time MD responded: MD in room to see patient

## 2014-09-20 NOTE — Progress Notes (Signed)
Pt is yelling out, when entering into the room , patient oxygen was off and laying next to him in the bed. Oxygen sats 77% room air. 4L/[place] reapplied and oxygen sats came up to 93%. Patient is now calm and resting.

## 2014-09-20 NOTE — Progress Notes (Signed)
Patient ID: [name] LemonsReuben S [name], male   DOB: 1951-02-21, 64 y.o.   MRN: [id number]     Subjective:    Combative this morning, does not answer questions.   Objective:   Temp:  [97.5 F (36.4 C)-98.2 F (36.8 C)] 97.6 F (36.4 C) (02/24 0400) Pulse Rate:  [35-129] 120 (02/24 0900) Resp:  [16-42] 23 (02/24 0900) BP: (80-121)/(50-96) 89/53 mmHg (02/24 0800) SpO2:  [91 %-99 %] 94 % (02/24 0900) Weight:  [179 lb 3.7 oz (81.3 kg)] 179 lb 3.7 oz (81.3 kg) (02/24 0500) Last BM Date: 09/15/14  Filed Weights   09/18/14 0500 09/19/14 0500 09/20/14 0500  Weight: 184 lb 8.4 oz (83.7 kg) 182 lb 12.2 oz (82.9 kg) 179 lb 3.7 oz (81.3 kg)    Intake/Output Summary (Last 24 hours) at 09/20/14 0923 Last data filed at 09/20/14 [id number]  Gross per 24 hour  Intake    150 ml  Output   1400 ml  Net  -1250 ml    Telemetry: SR  Exam:  General: NAD  Resp: CTAB  Cardiac: RRR, no m/r/g, no JVD  GI: abdomen soft, NT, ND  MSK: no LE edema  Psych: appropriate affect  Lab Results:  Basic Metabolic Panel:  Recent Labs Lab 09/18/14 1036 09/19/14 0431 09/20/14 0440  NA 139 143 147*  K 4.5 4.1 4.4  CL 91* 88* 91*  CO2 44* 46* >50*  GLUCOSE 184* 183* 186*  BUN 34* 38* 45*  CREATININE 1.01 0.95 1.03  CALCIUM 9.1 9.2 9.4  MG 2.3 2.4 2.8*    Liver Function Tests:  Recent Labs Lab 09/15/14 0620  AST 23  ALT 20  ALKPHOS 79  BILITOT 0.9  PROT 7.2  ALBUMIN 3.8    CBC:  Recent Labs Lab 09/16/14 0449 09/18/14 1036 09/20/14 0440  WBC 14.5* 18.4* 13.2*  HGB 14.1 14.3 15.2  HCT 45.1 45.4 50.8  MCV 101.3* 101.1* 104.7*  PLT 185 177 185    Cardiac Enzymes:  Recent Labs Lab 09/14/14 1513 09/15/14 0057 09/15/14 0620  TROPONINI 0.04* 0.07* 0.06*    BNP:  Recent Labs  05/24/14 1001 05/30/14 0407 06/28/14 1202  PROBNP 2785.0* 4076.0* 392.0*    Coagulation: No results for input(s): INR in the last 168 hours.  ECG:   Medications:   Scheduled Medications: .  antiseptic oral rinse  7 mL Mouth Rinse BID  . atorvastatin  40 mg Oral q1800  . B-complex with vitamin C  1 tablet Oral Daily  . budesonide-formoterol  2 puff Inhalation BID  . digoxin  0.125 mg Oral Daily  . feeding supplement (ENSURE COMPLETE)  237 mL Oral BID BM  . gabapentin  300 mg Oral QHS  . ipratropium  0.5 mg Nebulization Q6H  . levalbuterol  0.63 mg Nebulization Q6H  . methylPREDNISolone (SOLU-MEDROL) injection  40 mg Intravenous Q6H  . metoprolol succinate  12.5 mg Oral QPM  . metoprolol succinate  25 mg Oral Daily  . nicotine  14 mg Transdermal Daily  . pantoprazole  40 mg Oral Daily  . rivaroxaban  20 mg Oral Daily  . torsemide  20 mg Oral BID     Infusions:     PRN Medications:  colchicine, haloperidol lactate, HYDROcodone-acetaminophen, HYDROmorphone (DILAUDID) injection, ipratropium-albuterol, LORazepam, nitroGLYCERIN, ondansetron **OR** ondansetron (ZOFRAN) IV     Assessment/Plan   1. Acute on chronic systolic HF - 08/2014 echo LVEF 20-25%, restrictive diastolic function - ICM, hx of prior coronary stenting. Has [employer] Jude ICD -  negative 1.3 liters yesterday, negative 6.0 liters since admission. He is on oral torsemide  bid. Stable Cr but BUN and CO2 trending up, will change toresmide to once daily dosing.    2. Paroxysmal afib - from notes has failed multiple DCCVs despite treatment with amio. Rate control with dig and metoprolol. - on xarelto  3. Syncope - multiple metabolic abnormalities on admit including lactic acid 2.66, K 2.8.  - ICD interrogation showed 3 discharges over the last 3 weeks for VT, of note significant hypokalemia on admission. K has been replaced, beta blocker uptitrated.  - no recurrent events.    4. Lung mass - per pulmonary and primary team   5. Hypercapneic respiratory failure - Management per pulmonary, disproportionate ventilation vs oxygenation defect suggests other process than CHF. He is on bipap, from pulm note  patient does not want intubation.   6. Sepsis - due to pneumonia, abx per primary team.         Dina Rich, M.D.

## 2014-09-20 NOTE — Progress Notes (Deleted)
Clozapine REMS  Patient Registered MD registered Labs acceptable to continue per REMS program  [facility] LPBPittmanRPH 09/20/14 930 AM

## 2014-09-21 ENCOUNTER — Inpatient Hospital Stay (HOSPITAL_COMMUNITY): Payer: Medicare Other

## 2014-09-21 ENCOUNTER — Encounter (HOSPITAL_COMMUNITY): Payer: Self-pay | Admitting: Internal Medicine

## 2014-09-21 DIAGNOSIS — R778 Other specified abnormalities of plasma proteins: Secondary | ICD-10-CM | POA: Diagnosis present

## 2014-09-21 DIAGNOSIS — A419 Sepsis, unspecified organism: Secondary | ICD-10-CM | POA: Diagnosis present

## 2014-09-21 DIAGNOSIS — E872 Acidosis, unspecified: Secondary | ICD-10-CM | POA: Diagnosis present

## 2014-09-21 DIAGNOSIS — D72829 Elevated white blood cell count, unspecified: Secondary | ICD-10-CM | POA: Diagnosis present

## 2014-09-21 DIAGNOSIS — J9602 Acute respiratory failure with hypercapnia: Secondary | ICD-10-CM

## 2014-09-21 DIAGNOSIS — Z720 Tobacco use: Secondary | ICD-10-CM

## 2014-09-21 DIAGNOSIS — R7989 Other specified abnormal findings of blood chemistry: Secondary | ICD-10-CM | POA: Diagnosis present

## 2014-09-21 DIAGNOSIS — E876 Hypokalemia: Secondary | ICD-10-CM | POA: Diagnosis not present

## 2014-09-21 LAB — CBC
HEMATOCRIT: 50.8 % (ref 39.0–52.0)
Hemoglobin: 15.4 g/dL (ref 13.0–17.0)
MCH: 31.8 pg (ref 26.0–34.0)
MCHC: 30.3 g/dL (ref 30.0–36.0)
MCV: 105 fL — AB (ref 78.0–100.0)
Platelets: 183 10*3/uL (ref 150–400)
RBC: 4.84 MIL/uL (ref 4.22–5.81)
RDW: 13.3 % (ref 11.5–15.5)
WBC: 11.4 10*3/uL — AB (ref 4.0–10.5)

## 2014-09-21 LAB — BASIC METABOLIC PANEL
ANION GAP: 10 (ref 5–15)
BUN: 43 mg/dL — ABNORMAL HIGH (ref 6–23)
CO2: 49 mmol/L (ref 19–32)
CREATININE: 0.99 mg/dL (ref 0.50–1.35)
Calcium: 9.5 mg/dL (ref 8.4–10.5)
Chloride: 95 mmol/L — ABNORMAL LOW (ref 96–112)
GFR calc non Af Amer: 84 mL/min — ABNORMAL LOW (ref >90)
Glucose, Bld: 194 mg/dL — ABNORMAL HIGH (ref 70–99)
POTASSIUM: 4.5 mmol/L (ref 3.5–5.1)
SODIUM: 154 mmol/L — AB (ref 135–145)

## 2014-09-21 LAB — BLOOD GAS, ARTERIAL
Acid-Base Excess: 22.8 mmol/L — ABNORMAL HIGH (ref 0.0–2.0)
Bicarbonate: 49.1 mEq/L — ABNORMAL HIGH (ref 20.0–24.0)
Delivery systems: POSITIVE
Drawn by: 21310
EXPIRATORY PAP: 6
FIO2: 35 %
Inspiratory PAP: 14
O2 SAT: 91.6 %
PATIENT TEMPERATURE: 37
PH ART: 7.411 (ref 7.350–7.450)
PO2 ART: 62.9 mmHg — AB (ref 80.0–100.0)
TCO2: 42.2 mmol/L (ref 0–100)
pCO2 arterial: 78.8 mmHg (ref 35.0–45.0)

## 2014-09-21 LAB — MAGNESIUM: Magnesium: 3 mg/dL — ABNORMAL HIGH (ref 1.5–2.5)

## 2014-09-21 MED ORDER — ENSURE COMPLETE PO LIQD: 237.0000 mL | Freq: Two times a day (BID) | ORAL | Status: AC

## 2014-09-21 MED ORDER — HALOPERIDOL LACTATE 5 MG/ML IJ SOLN: 2.0000 mg | Freq: Four times a day (QID) | INTRAMUSCULAR | Status: AC | PRN

## 2014-09-21 MED ORDER — MORPHINE SULFATE 25 MG/ML IV SOLN
2.0000 mg/h | INTRAVENOUS | Status: DC
  Administered 2014-09-21 (×2): 2 mg/h via INTRAVENOUS
  Filled 2014-09-21 (×2): qty 10

## 2014-09-21 MED ORDER — CETYLPYRIDINIUM CHLORIDE 0.05 % MT LIQD: 7.0000 mL | Freq: Two times a day (BID) | OROMUCOSAL | Status: AC

## 2014-09-21 MED ORDER — MORPHINE SULFATE 25 MG/ML IV SOLN: INTRAVENOUS | Status: AC

## 2014-09-21 MED ORDER — IPRATROPIUM-ALBUTEROL 0.5-2.5 (3) MG/3ML IN SOLN: 3.0000 mL | RESPIRATORY_TRACT | Status: AC | PRN

## 2014-09-21 MED ORDER — LORAZEPAM 2 MG/ML IJ SOLN: 1.0000 mg | INTRAMUSCULAR | Status: AC | PRN

## 2014-09-21 MED ORDER — NICOTINE 14 MG/24HR TD PT24: 14.0000 mg | MEDICATED_PATCH | Freq: Every day | TRANSDERMAL | Status: DC

## 2014-09-21 MED ORDER — METHYLPREDNISOLONE SODIUM SUCC 40 MG IJ SOLR: 40.0000 mg | Freq: Four times a day (QID) | INTRAMUSCULAR | Status: DC

## 2014-09-21 MED ORDER — TORSEMIDE 20 MG PO TABS: 20.0000 mg | ORAL_TABLET | Freq: Every day | ORAL | Status: AC

## 2014-09-21 MED ORDER — LEVALBUTEROL HCL 0.63 MG/3ML IN NEBU: 0.6300 mg | INHALATION_SOLUTION | Freq: Four times a day (QID) | RESPIRATORY_TRACT | Status: AC

## 2014-09-21 MED ORDER — METOPROLOL SUCCINATE ER 25 MG PO TB24: 12.5000 mg | ORAL_TABLET | Freq: Every evening | ORAL | Status: DC

## 2014-09-21 MED ORDER — IPRATROPIUM BROMIDE 0.02 % IN SOLN: 0.5000 mg | Freq: Four times a day (QID) | RESPIRATORY_TRACT | Status: AC

## 2014-09-21 NOTE — Progress Notes (Signed)
Patient extremely agitated, flipping around in bed. Family at bedside, unable to console.  Dilaudid and Ativan given as ordered. Morphine drip increased per standing order. Will continue to monitor. [name], [name] 09/21/2014

## 2014-09-21 NOTE — Progress Notes (Signed)
When I came in to see [name] this morning his daughter and sister were present. There concern was with transfer to LTAC that even though [name] might improve he still had significant issues with COPD, CHF, episodes of cardiac arrhythmia with defibrillation and what is presumed to be lung cancer although this has not been fully defined. He has continued to have episodes of increased carbon dioxide level. I discussed all this with them at length. They feel that rather than continue with plans for LTAC that they would prefer hospice care. He has 2 sons who were not present but family is going to discuss with them. I told him that our responsibility in this situation is to make the decision that [name] would make if he could tell us what he wanted and they unanimously agreed that he would not want to remain in his current situation and would prefer to be comfortable. I told him they should discuss this further as a family including the 2 sons who were not present but that we could do inpatient hospice care or home hospice care. I'm not sure they can manage him at home. I told him that I was not sure that he can be made as comfortable at home as he could in an inpatient facility. I have a call into hospitalist attending to discuss all this

## 2014-09-21 NOTE — Progress Notes (Signed)
Patient very restless in bed, trying to pull bipap mask off face.  Family at bedside trying to help comfort patient to no avail.  Increased morphine drip to 3mg  per standing order. Will continue to monitor. [name], [name] 09/21/2014

## 2014-09-21 NOTE — Clinical Social Work Psychosocial (Signed)
Clinical Social Work Department BRIEF PSYCHOSOCIAL ASSESSMENT 09/21/2014  Patient:  [name],[name]     Account Number:  [id number]     Admit date:  09/14/2014  Clinical Social Worker:  [name]  Date/Time:  09/21/2014 01:47 PM  Referred by:  CSW  Date Referred:  09/21/2014 Referred for  Other - See comment   Other Referral:   Hospice Referral   Interview type:  Family Other interview type:   [name], daughter  [name], son  [name], son    PSYCHOSOCIAL DATA Living Status:  FAMILY Admitted from facility:   Level of care:   Primary support name:  Children Primary support relationship to patient:  CHILD, ADULT Degree of support available:   Patient has a very supportive family    CURRENT CONCERNS Current Concerns  Post-Acute Placement   Other Concerns:    SOCIAL WORK ASSESSMENT / PLAN CSW spoke with [name] at [facility]. [name] indicated that she had spoken with the family and advised that patient would need to be off the Bi-PAP for four hours before being admitted. She also indicated that patient would have to have his defibrillator turned off. [name] also indicated that patient code status would need to be DNR. She advised that there was one bed available.  CSW met with patient'[name] children as patient was on Bi-PAP and could not be participate.  CSW provided supportive counseling to family.  [name] indicated that patient had been living at home with his adopted son when this current onset of illness occurred. They indicated that the adopted son is now living with his biological mother.  CSW discussed with family their thoughts regarding patient going to [name].  [name] indicated "we want him to be a peace." She went on to say that they did not want him to suffer.  Her brother'[name] agreed.  Patient'[name] children agreed that patient patient'[name] code status could change to DNR as a requirement for Hospice.  They indicated that  they wanted to wait to turn off his defibrillator until after patient was off the Bi- pap and had been started on the new pain medication.  The indicated that they would notify CSW when there were ready to turn of defibrillator.  Family was tearful as they accepted that patient would be going to the Hospice Home. CSW provided supportive counseling.   Assessment/plan status:  Information/Referral to [employer] Other assessment/ plan:   Information/referral to community resources:    PATIENT'[name]/FAMILY'[name] RESPONSE TO PLAN OF CARE: Family is agreeable to patient going to Hospice. They desire for him to be comfortable and at peace.    [name], [name]

## 2014-09-21 NOTE — Clinical Social Work Note (Signed)
CS[facility] RN [facility] Facility is willing to accept pt later this evening once off bipap and defibrillator is turned off, as long as morphine has been ordered and available. D/C summary faxed and out of facility DNR is on chart for transport.   Derenda FennelKara Owen Pagnotta, Kentucky[place]CSW [phone]

## 2014-09-21 NOTE — Progress Notes (Signed)
Patient's family insistent that bipap be left on at this time.  Waiting for other family members coming from out of town to see patient prior to removing bipap. Will continue to monitor. [name], [name] 09/21/2014

## 2014-09-21 NOTE — Progress Notes (Signed)
Patient ID: [name] LemonsReuben S Bowermaster, male   DOB: July 18, 1951, 64 y.o.   MRN: [id number]  Notes reviewed, patient for discharge to home hospice. We will signoff from inpatient care.      Dina RichJonathan Parag Dorton, M.D

## 2014-09-21 NOTE — Progress Notes (Signed)
[employer] representative here to disable ICD. Family insistent that patient's ICD not be turned off at this time. Daughter stated that they had family that had not arrived yet and she "did not want something to happen until they had seen him". [employer] rep stated that charge nurse and I that in the event that the patient did arrest, we were to place a magnet over the ICD in order to suspend it. Family made aware that we were aware of what to do in the event that the patient arrested. Patient's family hesistant about turning off bipap as well.  MD notified.  [name], [name] 09/21/2014

## 2014-09-21 NOTE — Discharge Summary (Signed)
Physician Discharge Summary  [name] FXT:[id number] DOB: 18-Apr-1951 DOA: 09/14/2014  PCP: Monico Blitz, MD  Admit date: 09/14/2014 Discharge date: 09/21/2014   Recommendations for Outpatient Follow-Up:   1. The patient will discharged to a residential hospice facility when a bed is available.   Discharge Diagnosis:   Principal Problem:    Acute respiratory failure with hypercapnia Active Problems:    Ischemic cardiomyopathy    Chronic atrial fibrillation    Chronic systolic heart failure    Bilateral pleural effusions    Acute on chronic combined systolic and diastolic CHF (congestive heart failure)    AICD (automatic cardioverter/defibrillator) present    Cigarette smoker    Syncope    CAP (community acquired pneumonia)    Faintness    Congestive heart disease    COPD exacerbation    Sepsis     Lactic acidosis    Troponin elevation    Leukocytosis    Hypokalemia    Pulmonary nodule, solitary    Hyperlipidemia    Coronary artery disease   Discharge Condition: Guarded.  Diet recommendation: Low sodium, heart healthy.    History of Present Illness:   [name] is an 64 y.o. male with a PMH of chronic systolic heart failure, status post AICD, last 2 D ECHO in 08/16/13 with EF of 25%, chronic atrial fibrillation on anticoagulation with xarelto, hypertension who was admitted 09/14/14 for evaluation of syncope. Patient reported feeling lightheaded prior to syncopal event. There was no report of other prodromal symptoms prior to syncope such as chest pain or shortness of breath or palpitations.  On admission, blood pressure was 93/70, HR 41 - 109, RR 20, oxygen saturation 94% on room air. Blood work revealed mild leukocytosis of 10.7, potassium 2.8, troponin level 0.04, lactic acid 2.6, BNP 335. Chest x-ray showed CHF, right basilar opacity, consistent with possible pneumonia, left upper lobe pulmonary nodule. CT chest with contrast showed  bilateral pleural effusion right greater than left, increased in size of posterior left upper lobe nodule measuring 16 x 13 mm and previously 13 x 11 mm, malignancy not entirely excluded. Patient was admitted for further evaluation of syncopal event. Cardiology was consulted. Pulmonary was consulted for possible biopsy of the pulmonary nodule. Recommended PET scan for evaluation prior to biopsy.  Patient went into respiratory distress on 09/16/2014, more rhonchorous on clinical exam and in more pain. CXR showed worsening of interstitial edema and CHF, bilateral pleural effusions right greater than left. He was hypotensive and could not get IV lasix. He was on PO torsemide however. He was transferred to SDU 09/16/2014 since he required BiPAP for respiratory support. Patient has improved in past 24 hours. He had some overnight confusion episodes.    Hospital Course by Problem:    Principal Problem: Acute respiratory failure with hypoxia and hypercarbia / COPD exacerbation / Bilateral pleural effusion right greater than left - Patient went into respiratory distress 09/16/2014 and required Bipap and transfer to stepdown unit. Chest x-ray showed worsening CHF. Patient could not get IV Lasix because he was hypotensive but he was on by mouth torsemide. - Chest x-ray on 09/19/2014 showed stable appearance of the chest consistent with CHF and pulmonary interstitial edema, retrocardiac atelectasis or pneumonia is stable. - Pulmonologist followed patient and assisted with management, and the patient has required bipap for treatment of significant hypercarbia. - Current nebulizer treatment includes Xopenex and Atrovent every 6 hours scheduled. Patient is also on Solu-Medrol 40 mg IV every 6 hours  per pulmonary recommendations.  Steroids will be stopped on discharge. - Family meeting held with pulmonologist, with plans to discharge to residential hospice facility for end-of-life care.  Active  Problems: Syncope/Faintness - Likely secondary to metabolic abnormalities on the admission including lactic acidosis and hypokalemia. - ICD was interrogated and showed 3 discharges in past 3 weeks for VT; per cardiology hypokalemia may have triggered a VT causing ICD discharge and syncope.  - Potassium supplemented and it remains within normal limits. - Followed by cardiology while in the hospital.  Sepsis secondary to community acquired pneumonia / lactic acidosis / leukocytosis  - Sepsis criteria met on the admission with initial vitals signs that included hypotension, tachycardia, tachypnea, hypoxia.  In addition patient had mild leukocytosis, 10.7, lactic acid elevated at 2.6. Evidence of pneumonia seen on chest x-ray. - Blood cultures to date show no growth.  - Completed a 7 day course of Levaquin 09/10/14.  Coronary artery disease / Ischemic cardiomyopathy / mild troponin elevation  - Mild troponin elevation seen on the admission 0.04 - 0.07. Felt to be demand ischemia from sepsis, CHF. - Cardiologist followed patient while in the hospital.  Chronic atrial fibrillation - CHADS2/vasc score 4. - Continue anticoagulation with xarelto.  Would d/c at discharge to hospice. - Continue digoxin 0.125 mg daily and metoprolol.  Acute on chronic combined systolic and diastolic heart failure / AICD (automatic cardioverter/defibrillator) present - 2-D ECHO in 08/2014 with EF 20-25%, restrictive diastolic function. - Continue daily weight and strict intake and output. Weight down in past 96 hours: 185 lbs --> 186 lbs --> 184 lbs --> 182 lbs--->179 lbs--->173 lbs. - Continue torsemide 20 mg daily (dose reduced 09/20/14 due to rising creatinine/bicarb).  Hyperlipidemia -D/C statin at discharge.  Hypokalemia - Due to torsemide. - Being supplemented.  Pulmonary nodule - Left upper lobe nodule measuring 16 x 13 mm and previously 13 x 11 mm, malignancy not entirely excluded. - Transitioning to  comfort care.  DVT Prophylaxis  - On anticoagulation with xarelto.  Discontinue.     Medical Consultants:    Alonza Bogus, MD, Pulmonary  Herminio Commons, MD, Cardiology   Discharge Exam:   Filed Vitals:   09/21/14 0720  BP:   Pulse: 107  Temp:   Resp: 16   Filed Vitals:   09/21/14 0532 09/21/14 0600 09/21/14 0718 09/21/14 0720  BP:  103/71    Pulse: 131 105  107  Temp:      TempSrc:      Resp: $Remo'27 15  16  'LtTqQ$ Height:      Weight:  78.7 kg (173 lb 8 oz)    SpO2: 95% 90% 90% 91%    Gen: Restless Cardiovascular: Tachy/reg, No M/R/G, +JVD Respiratory: Lungs diminished with poor air movement. Gastrointestinal: Abdomen soft, NT/ND, + BS Extremities: No C/E/C   The results of significant diagnostics from this hospitalization (including imaging, microbiology, ancillary and laboratory) are listed below for reference.     Procedures and Diagnostic Studies:   Ct Chest W Contrast 09/14/2014 BILATERAL pleural effusions RIGHT greater than LEFT little changed with compressive atelectasis of the lower lobes. Subsegmental atelectasis at anterior RIGHT middle lobe base. Increase in size of a posterior LEFT upper lobe nodule measuring 16 x 13 mm previously 13 x 11 mm; malignancy is not excluded and tissue diagnosis is recommended.   Dg Chest Portable 1 View 09/14/2014 1. CHF. 2. Right basilar opacity which could reflect pneumonia or pleural fluid with atelectasis. Suggest two-view imaging  when feasible. 3. Left upper lobe pulmonary nodule, reference chest CT 05/24/2014.   Dg Chest Port 1 View 09/17/2014 Persistent CHF with pulmonary edema and small RIGHT pleural effusion, little changed.   Dg Chest Port 1 View 09/18/2014 1. The appearance the chest suggests worsening congestive heart failure.   Dg Chest Port 1 View 09/19/2014 Stable appearance of the chest consistent with CHF, pulmonary interstitial edema, and right pleural effusion. Retrocardiac atelectasis  or pneumonia is stable.  Dg Chest Port 1 View 09/20/2014: Bilateral airspace opacities, worsening on the right. Favor worsening asymmetric edema. Small to moderate right pleural effusion.    Labs:   Basic Metabolic Panel:  Recent Labs Lab 09/15/14 0616  09/16/14 0449 09/18/14 1036 09/19/14 0431 09/20/14 0440 09/21/14 0503  NA  --   < > 140 139 143 147* 154*  K  --   < > 4.4 4.5 4.1 4.4 4.5  CL  --   < > 94* 91* 88* 91* 95*  CO2  --   < > 39* 44* 46* >50* 49*  GLUCOSE  --   < > 127* 184* 183* 186* 194*  BUN  --   < > 16 34* 38* 45* 43*  CREATININE  --   < > 1.03 1.01 0.95 1.03 0.99  CALCIUM  --   < > 9.3 9.1 9.2 9.4 9.5  MG 1.8  --   --  2.3 2.4 2.8* 3.0*  < > = values in this interval not displayed. GFR Estimated Creatinine Clearance: 69.5 mL/min (by C-G formula based on Cr of 0.99). Liver Function Tests:  Recent Labs Lab 09/15/14 0620  AST 23  ALT 20  ALKPHOS 79  BILITOT 0.9  PROT 7.2  ALBUMIN 3.8    CBC:  Recent Labs Lab 09/14/14 1513 09/15/14 0620 09/16/14 0449 09/18/14 1036 09/20/14 0440 09/21/14 0503  WBC 10.7* 10.5 14.5* 18.4* 13.2* 11.4*  NEUTROABS 7.7  --   --   --   --   --   HGB 14.7 14.5 14.1 14.3 15.2 15.4  HCT 44.3 45.5 45.1 45.4 50.8 50.8  MCV 96.9 99.8 101.3* 101.1* 104.7* 105.0*  PLT 204 200 185 177 185 183   Cardiac Enzymes:  Recent Labs Lab 09/14/14 1513 09/15/14 0057 09/15/14 0620  TROPONINI 0.04* 0.07* 0.06*   Microbiology Recent Results (from the past 240 hour(s))  Blood culture (routine x 2)     Status: None   Collection Time: 09/14/14  3:13 PM  Result Value Ref Range Status   Specimen Description BLOOD LEFT ARM DRAWN BY RN  Final   Special Requests BOTTLES DRAWN AEROBIC AND ANAEROBIC 6CC EACH  Final   Culture NO GROWTH 5 DAYS  Final   Report Status 09/19/2014 FINAL  Final  Blood culture (routine x 2)     Status: None   Collection Time: 09/14/14  5:40 PM  Result Value Ref Range Status   Specimen Description BLOOD  RIGHT HAND  Final   Special Requests BOTTLES DRAWN AEROBIC AND ANAEROBIC 6CC  Final   Culture NO GROWTH 5 DAYS  Final   Report Status 09/19/2014 FINAL  Final  MRSA PCR Screening     Status: None   Collection Time: 09/16/14  9:55 AM  Result Value Ref Range Status   MRSA by PCR NEGATIVE NEGATIVE Final    Comment:        The GeneXpert MRSA Assay (FDA approved for NASAL specimens only), is one component of a comprehensive MRSA colonization surveillance  program. It is not intended to diagnose MRSA infection nor to guide or monitor treatment for MRSA infections.      Discharge Instructions:       Discharge Instructions    (HEART FAILURE PATIENTS) Call MD:  Anytime you have any of the following symptoms: 1) 3 pound weight gain in 24 hours or 5 pounds in 1 week 2) shortness of breath, with or without a dry hacking cough 3) swelling in the hands, feet or stomach 4) if you have to sleep on extra pillows at night in order to breathe.    Complete by:  As directed      Call MD for:  extreme fatigue    Complete by:  As directed      Call MD for:  severe uncontrolled pain    Complete by:  As directed      Call MD for:  temperature >100.4    Complete by:  As directed      Diet - low sodium heart healthy    Complete by:  As directed      Increase activity slowly    Complete by:  As directed             Medication List    STOP taking these medications        levalbuterol 45 MCG/ACT inhaler  Commonly known as:  XOPENEX HFA     losartan 25 MG tablet  Commonly known as:  COZAAR     losartan 50 MG tablet  Commonly known as:  COZAAR      TAKE these medications        antiseptic oral rinse 0.05 % Liqd solution  Commonly known as:  CPC / CETYLPYRIDINIUM CHLORIDE 0.05%  7 mLs by Mouth Rinse route 2 (two) times daily.     atorvastatin 40 MG tablet  Commonly known as:  LIPITOR  Take 1 tablet (40 mg total) by mouth daily.     B-complex with vitamin C tablet  Take 1 tablet by  mouth daily.     budesonide-formoterol 160-4.5 MCG/ACT inhaler  Commonly known as:  SYMBICORT  Inhale 2 puffs into the lungs 2 (two) times daily.     colchicine 0.6 MG tablet  Take 0.6 mg by mouth 2 (two) times daily as needed (for gout).     digoxin 0.125 MG tablet  Commonly known as:  LANOXIN  Take 1 tablet (0.125 mg total) by mouth daily.     feeding supplement (ENSURE COMPLETE) Liqd  Take 237 mLs by mouth 2 (two) times daily between meals.     gabapentin 300 MG capsule  Commonly known as:  NEURONTIN  Take 300 mg by mouth at bedtime.     haloperidol lactate 5 MG/ML injection  Commonly known as:  HALDOL  Inject 0.4 mLs (2 mg total) into the vein every 6 (six) hours as needed.     HYDROcodone-acetaminophen 5-325 MG per tablet  Commonly known as:  NORCO/VICODIN  Take 1 tablet by mouth 2 (two) times daily as needed for moderate pain.     ipratropium 0.02 % nebulizer solution  Commonly known as:  ATROVENT  Take 2.5 mLs (0.5 mg total) by nebulization every 6 (six) hours.     ipratropium-albuterol 0.5-2.5 (3) MG/3ML Soln  Commonly known as:  DUONEB  Take 3 mLs by nebulization every 2 (two) hours as needed.     levalbuterol 0.63 MG/3ML nebulizer solution  Commonly known as:  XOPENEX  Take 3 mLs (0.63  mg total) by nebulization every 6 (six) hours.     LORazepam 2 MG/ML injection  Commonly known as:  ATIVAN  Inject 0.5-1 mLs (1-2 mg total) into the vein every 4 (four) hours as needed for anxiety.     methylPREDNISolone sodium succinate 40 mg/mL injection  Commonly known as:  SOLU-MEDROL  Inject 1 mL (40 mg total) into the vein every 6 (six) hours.     metoprolol succinate 25 MG 24 hr tablet  Commonly known as:  TOPROL-XL  Take 0.5 tablets (12.5 mg total) by mouth every evening.     nicotine 14 mg/24hr patch  Commonly known as:  NICODERM CQ - dosed in mg/24 hours  Place 1 patch (14 mg total) onto the skin daily.     nitroGLYCERIN 0.4 MG SL tablet  Commonly known as:   NITROSTAT  Place 1 tablet (0.4 mg total) under the tongue every 5 (five) minutes as needed for chest pain.     omeprazole 20 MG capsule  Commonly known as:  PRILOSEC  Take 20 mg by mouth daily.     rivaroxaban 20 MG Tabs tablet  Commonly known as:  XARELTO  Take 1 tablet (20 mg total) by mouth daily.     torsemide 20 MG tablet  Commonly known as:  DEMADEX  Take 1 tablet (20 mg total) by mouth daily.          Time coordinating discharge: 35 minutes.  Signed:  Layne Lebon  Pager [phone] [facility] 09/21/2014, 7:30 AM

## 2014-09-21 NOTE — Progress Notes (Addendum)
Respiratory therapist and nurse questioned family about removing bipap at this time.  Informed family that Hospice Home requires patient to be off of bipap for four hours prior to transporting to hospice. Family still not ready to remove bipap. Hospice Home notified and stated that patient should probably stay the night as it was getting too late to move patient due to inability to obtain morphine drip late at night since pharmacy would be closed. Notified MD. Notified family. Will continue to monitor. [name], [name] 09/21/2014

## 2014-09-22 ENCOUNTER — Inpatient Hospital Stay (HOSPITAL_COMMUNITY)
Admit: 2014-09-22 | Discharge: 2014-09-26 | DRG: 871 | Disposition: E | Source: Hospice | Attending: Internal Medicine | Admitting: Internal Medicine

## 2014-09-22 DIAGNOSIS — A419 Sepsis, unspecified organism: Principal | ICD-10-CM | POA: Diagnosis present

## 2014-09-22 DIAGNOSIS — I509 Heart failure, unspecified: Secondary | ICD-10-CM

## 2014-09-22 DIAGNOSIS — Z515 Encounter for palliative care: Secondary | ICD-10-CM

## 2014-09-22 DIAGNOSIS — J189 Pneumonia, unspecified organism: Secondary | ICD-10-CM | POA: Diagnosis present

## 2014-09-22 DIAGNOSIS — J441 Chronic obstructive pulmonary disease with (acute) exacerbation: Secondary | ICD-10-CM | POA: Diagnosis present

## 2014-09-22 DIAGNOSIS — I482 Chronic atrial fibrillation: Secondary | ICD-10-CM | POA: Diagnosis present

## 2014-09-22 DIAGNOSIS — J9601 Acute respiratory failure with hypoxia: Secondary | ICD-10-CM | POA: Diagnosis present

## 2014-09-22 DIAGNOSIS — E872 Acidosis: Secondary | ICD-10-CM | POA: Diagnosis present

## 2014-09-22 DIAGNOSIS — I255 Ischemic cardiomyopathy: Secondary | ICD-10-CM | POA: Diagnosis present

## 2014-09-22 DIAGNOSIS — I5043 Acute on chronic combined systolic (congestive) and diastolic (congestive) heart failure: Secondary | ICD-10-CM | POA: Diagnosis present

## 2014-09-22 LAB — BASIC METABOLIC PANEL
BUN: 49 mg/dL — AB (ref 6–23)
Calcium: 9.4 mg/dL (ref 8.4–10.5)
Chloride: 100 mmol/L (ref 96–112)
Creatinine, Ser: 1.03 mg/dL (ref 0.50–1.35)
GFR calc Af Amer: 86 mL/min — ABNORMAL LOW (ref >90)
GFR calc non Af Amer: 74 mL/min — ABNORMAL LOW (ref >90)
GLUCOSE: 178 mg/dL — AB (ref 70–99)
POTASSIUM: 5.1 mmol/L (ref 3.5–5.1)
Sodium: 158 mmol/L — ABNORMAL HIGH (ref 135–145)

## 2014-09-22 LAB — MAGNESIUM: Magnesium: 3.2 mg/dL — ABNORMAL HIGH (ref 1.5–2.5)

## 2014-09-22 MED ORDER — LEVALBUTEROL HCL 0.63 MG/3ML IN NEBU: 0.6300 mg | INHALATION_SOLUTION | Freq: Four times a day (QID) | RESPIRATORY_TRACT | Status: DC

## 2014-09-22 MED ORDER — LORAZEPAM 2 MG/ML IJ SOLN: 1.0000 mg | INTRAMUSCULAR | Status: DC | PRN

## 2014-09-22 MED ORDER — ACETAMINOPHEN 650 MG RE SUPP: 650.0000 mg | Freq: Four times a day (QID) | RECTAL | Status: DC | PRN

## 2014-09-22 MED ORDER — IPRATROPIUM BROMIDE 0.02 % IN SOLN: 0.5000 mg | Freq: Four times a day (QID) | RESPIRATORY_TRACT | Status: DC

## 2014-09-22 MED ORDER — METHYLPREDNISOLONE SODIUM SUCC 40 MG IJ SOLR
40.0000 mg | Freq: Four times a day (QID) | INTRAMUSCULAR | Status: DC
  Administered 2014-09-22 – 2014-09-23 (×2): 40 mg via INTRAVENOUS
  Filled 2014-09-22 (×2): qty 1

## 2014-09-22 MED ORDER — SODIUM CHLORIDE 0.9 % IV SOLN: 250.0000 mL | INTRAVENOUS | Status: DC | PRN

## 2014-09-22 MED ORDER — SODIUM CHLORIDE 0.9 % IV SOLN
2.0000 mg/h | INTRAVENOUS | Status: DC
  Filled 2014-09-22: qty 10

## 2014-09-22 MED ORDER — HYDROMORPHONE HCL 1 MG/ML IJ SOLN: 1.0000 mg | INTRAMUSCULAR | Status: DC | PRN

## 2014-09-22 MED ORDER — CETYLPYRIDINIUM CHLORIDE 0.05 % MT LIQD
7.0000 mL | Freq: Two times a day (BID) | OROMUCOSAL | Status: DC
  Administered 2014-09-22: 7 mL via OROMUCOSAL

## 2014-09-22 MED ORDER — TORSEMIDE 20 MG PO TABS: 20.0000 mg | ORAL_TABLET | Freq: Every day | ORAL | Status: DC

## 2014-09-22 MED ORDER — IPRATROPIUM-ALBUTEROL 0.5-2.5 (3) MG/3ML IN SOLN: 3.0000 mL | RESPIRATORY_TRACT | Status: DC | PRN

## 2014-09-22 MED ORDER — ONDANSETRON HCL 4 MG PO TABS: 4.0000 mg | ORAL_TABLET | Freq: Four times a day (QID) | ORAL | Status: DC | PRN

## 2014-09-22 MED ORDER — SODIUM CHLORIDE 0.9 % IJ SOLN
3.0000 mL | Freq: Two times a day (BID) | INTRAMUSCULAR | Status: DC
  Administered 2014-09-22: 3 mL via INTRAVENOUS

## 2014-09-22 MED ORDER — HALOPERIDOL LACTATE 5 MG/ML IJ SOLN: 2.0000 mg | Freq: Four times a day (QID) | INTRAMUSCULAR | Status: DC | PRN

## 2014-09-22 MED ORDER — ONDANSETRON HCL 4 MG/2ML IJ SOLN: 4.0000 mg | Freq: Four times a day (QID) | INTRAMUSCULAR | Status: DC | PRN

## 2014-09-22 MED ORDER — SODIUM CHLORIDE 0.9 % IJ SOLN: 3.0000 mL | INTRAMUSCULAR | Status: DC | PRN

## 2014-09-22 MED ORDER — ACETAMINOPHEN 325 MG PO TABS: 650.0000 mg | ORAL_TABLET | Freq: Four times a day (QID) | ORAL | Status: DC | PRN

## 2014-09-22 NOTE — Progress Notes (Signed)
08/28/2014 [place] nurse and MSW made a visit to the patient's room in ICU at [facility]. Nurse spoke with the patient's family. Nurse, MSW and family met in the family waiting room to discuss Hospice and hospice services. [place] policies, purposes and services were discussed. Nurse went to the patient's room to assess the patient. He is unresponsive lying in bed. He has a Bipap mask on. The Patient has an IV to his left upper arm. Family states that they hospital had to hold the patient for someone to start the IV. The patient is receiving Morphine 1mg /ml at 6 mg per hour via this IV. The patient's heart is strong and irregular at 100 bpm. Respirations are swallow. Lung sounds are very diminished. The patient's abdomen is soft and flat. No bowel sounds were noted. Slight puffiness noted to the patient's hands and feet. The family states that the patient does have edema to his feet and legs. Hands and feet are warm to the touch. No mottling noted. The patient has a Foley catheter that is draining clear amber urine. The family states that the patient is not putting out a lot of urine. The family states that the patient has not eaten anything in two days. The family states that they have decided to have the pacemaker and defibrillator turned off and the Bipap removed. The nurse and family spoke with the ICU nurse Purcell Nails to voice the family's request. He stated that he would contact the doctor. Nurse spoke with Purcell Nails concerning the patient's current medications. Purcell Nails states that the doctors have not discontinued the oral medications but the patient is not able to take any oral medications. Purcell Nails states that the patient continues to the Morphine IV, Methylprednisolone IVP and Xopenex nebs. Nurse discussed with Purcell Nails that [facility] staff will make daily visits and the patient will be transferred to [facility] when a bed is available and the patient is able to make to transfer via EMS. Nurse spoke again with  the patient's family and encouraged them to call [facility] with any questions or concerns. They verbalized understanding. Davonna Belling RN 09/19/2014 1245

## 2014-09-22 NOTE — Progress Notes (Signed)
Gave report to 300 RN who will be taking care of patient. Did tell RN that patient will be a hospice patient and a DNR comfort care. No questions asked all information that I had was given.

## 2014-09-22 NOTE — Progress Notes (Signed)
He is more comfortable now than yesterday. He did have episodes of low heart rate. Plans are for him to be transferred to hospice inpatient facility

## 2014-09-22 NOTE — Progress Notes (Signed)
Rhythm change on monitor. HR dipped to 30s. Family notified and are on their way in to be with patient.

## 2014-09-22 NOTE — Progress Notes (Signed)
This MSW met with Pt's children [name] and [name] to review Hospice philosophy and services and to assess needs. Pt has a DNR in place due to rapidly declining condition but no other advanced directives. 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 Family's goal is for Pt's comfort and safety.    MSW reviewed options with family. They report that Pt would not want to be cremated. Pt owns two tracts of land; one he "owes more than it's worth," but the other, if sold, could help pay for a service. Family had already planned to speak with [facility]; MSW encouraged them to do so and explained that the [facility] Dept. Of Social Services might be able to provide additional funds. 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    Family report that Pt's health has been in decline for a while. In November, Pt was found to have a lung mass, but did not tell his family. He had been very private about all of his affairs, particularly his health. Pt's son said, "If nobody knew about it, he didn't have to talk about it." They also suspect Pt didn't want his children to worry. Pt has been in hospital for 8 days and declined significantly over course of hospitalization. He is now bipap dependent, and on a Morphine drip for comfort. Family verbalize understanding that Pt's time is very short. Staff came last night to turn off Pt's AICD/ pacemaker, but family refused this intervention because they were awaiting further family to come say their goodbyes. They are now ready for this to occur. They verbalized feeling that Pt is "tired," and has "given up."      Pt's family's primary concern is making funeral arrangements, and for their children. Pt has  multiple grandchildren and great-grandchildren ranging from adults to infants. SW discussed children and grief and available resources. Will send family information about [facility].      SW discussed signs and symptoms of approaching death with family and provided a booklet for them to review. [facility] 24hr Social worker service. Discussed Hospice Home services and financial policy; family would have to complete a sliding scale if admitted to [facility]. Pt's family hope that Pt will be able to become stable enough to come to the [facility]; they do not want him to die in hospital if possible. Extensive emotional support and reassurance provided to family. Encouraged them to continue talking with Pt, holding his hand, telling stories, etc to support him and each other. MSW will plan to visit again on Monday. Family verbalized appreciation of services and support.  Donia Ast, MSW [facility] [phone] 09/21/2014, 12:30pm.

## 2014-09-22 NOTE — Progress Notes (Deleted)
This MSW met with Pt's children [name] and [name] to review Hospice philosophy and services and to assess needs. Pt has a DNR in place due to rapidly declining condition but no other advanced directives. 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 Family's goal is for Pt's comfort and safety. °   MSW reviewed options with family. They report that Pt would not want to be cremated. Pt owns two tracts of land; one he "owes more than it's worth," but the other, if sold, could help pay for a service. Family had already planned to speak with [employer]; MSW encouraged them to do so and explained that the [place] Dept. [employer] Social [employer] might be able to provide additional funds. Daughter [name] has talked to her church, [employer], about holding the service there, as Pt was a Christian but not a regular church attendee.    Family report that Pt's health has been in decline for a while. In November, Pt was found to have a lung mass, but did not tell his family. He had been very private about all of his affairs, particularly his health. Pt's son said, "If nobody knew about it, he didn't have to talk about it." They also suspect Pt didn't want his children to worry. Pt has been in hospital for 8 days and declined significantly over course of hospitalization. He is now bipap dependent, and on a Morphine drip for comfort. Family verbalize understanding that Pt's time is very short. Staff came last night to turn off Pt's AICD/ pacemaker, but family refused this intervention because they were awaiting further family to come say their goodbyes. They are now ready for this to occur. They verbalized feeling that Pt is "tired," and has "given up."  °    Pt's family's primary concern is making funeral arrangements, and for their children. Pt has  multiple grandchildren and great-grandchildren ranging from adults to infants. SW discussed children and grief and available resources. Will send family information about [facility].  °    SW discussed signs and symptoms of approaching death with family and provided a booklet for them to review. [facility] 24hr RN On-Call service. Discussed Hospice Home services and financial policy; family would have to complete a sliding scale if admitted to Hospice Home. Pt's family hope that Pt will be able to become stable enough to come to the Hospice Home; they do not want him to die in hospital if possible. Extensive emotional support and reassurance provided to family. Encouraged them to continue talking with Pt, holding his hand, telling stories, etc to support him and each other. MSW will plan to visit again on Monday. Family verbalized appreciation of services and support. ° °Megan Cole, MSW °[facility] °[phone] °09/19/2014, 12:30pm. °

## 2014-09-22 NOTE — Progress Notes (Signed)
Placed patient in room 308 and left in care of staff. Patient had a bag of morphine that pharmacy left in our refrigerator, this was transported with patient I place in 300 refrigerator and told Jessica on 300 bag was in refrigerator.

## 2014-09-22 NOTE — H&P (Signed)
[facility] History and Physical  [name] LemonsReuben S [name] ZOX:[id number] DOB: [birth year] DOA: 09/14/2014  PCP: [name],ASHISH, MD   Chief Complaint: Hospice admission  HPI: [name] is a 64 y.o. male who was admitted to the hospital with acute respiratory failure which is felt to be secondary to COPD as well as congestive heart failure. The patient was treated aggressively with intravenous steroids, bronchodilators as well as diuretics. He was followed by pulmonology and cardiology during his hospital stay. He completed 7 days of antibiotics for presumed sepsis related to community-acquired pneumonia. Despite aggressive measures, the patient overall clinical status deteriorated. He was unable to wean from BiPAP therapy and he progressively hypoxic and lethargic. After discussion with the family, decision was made to start the patient on a morphine infusion for dyspnea and transition towards comfort care. The patient is being followed by hospice services and will now be under GIP status.   Review of Systems:  Cannot assess due to mental status  Past Medical History  Diagnosis Date  . Ischemic cardiomyopathy     cardiomyopathy ischemic  . Paroxysmal atrial fibrillation   . Renal calculi   . 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 Chronic systolic dysfunction of left ventricle   . Coronary artery disease   . Chronic pain     chronic left abdominal pain  . 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 Automatic implantable cardioverter-defibrillator in situ 04/19/2009  . Chronic atrial fibrillation     On Xarelto anticoagulation    . Chronic systolic heart failure   . Hyperlipidemia 08/21/2010  . Cough syncope 06/16/2011    Possibly related to ACE inhibitors    Past Surgical History  Procedure Laterality Date  . Difibrillator insertion  12/31/06    S[facility] Current DR RF ICD implanted by Dr Graciela HusbandsKlein  . Cholecystectomy    .  Cardioversion N/A 12/10/2012    Procedure: CARDIOVERSION;  Surgeon: Marinus MawGregg W Taylor, MD;  [facility];  Service: Cardiovascular;  Laterality: N/A;  . Implantable cardioverter defibrillator (icd) generator change N/A 07/03/2014    Procedure: ICD GENERATOR CHANGE;  Surgeon: Marinus MawGregg W Taylor, MD;  [facility];  Service: Cardiovascular;  Laterality: N/A;   Social History:  reports that he has been smoking Cigarettes.  He has a 20 pack-year smoking history. He uses smokeless tobacco. He reports that he does not drink alcohol or use illicit drugs.  No Known Allergies  Family History  Problem Relation Age of Onset  . Coronary artery disease Other     family history of CAD  . Cancer Mother     Prior to Admission medications   Medication Sig Start Date End Date Taking? Authorizing Provider  atorvastatin (LIPITOR) 40 MG tablet Take 1 tablet (40 mg total) by mouth daily. 06/08/14  Yes Aundria RudAli B Cosgrove, NP  B Complex-C (B-COMPLEX WITH VITAMIN C) tablet Take 1 tablet by mouth daily.   Yes Historical Provider, MD  budesonide-formoterol (SYMBICORT) 160-4.5 MCG/ACT inhaler Inhale 2 puffs into the lungs 2 (two) times daily.   Yes Historical Provider, MD  colchicine 0.6 MG tablet Take 0.6 mg by mouth 2 (two) times daily as needed (for gout).    Yes Historical Provider, MD  digoxin (LANOXIN) 0.125 MG tablet Take 1 tablet (0.125 mg total) by mouth daily. 11/11/13  Yes Marinus MawGregg W Taylor, MD  gabapentin (NEURONTIN) 300 MG capsule Take 300 mg by mouth at bedtime.   Yes Historical  Provider, MD  HYDROcodone-acetaminophen (NORCO/VICODIN) 5-325 MG per tablet Take 1 tablet by mouth 2 (two) times daily as needed for moderate pain.   Yes Historical Provider, MD  levalbuterol [facility] HFA) 45 MCG/ACT inhaler Inhale 1-2 puffs into the lungs every 6 (six) hours as needed for wheezing. Patient taking differently: Inhale 2 puffs into the lungs every 4 (four) hours as needed for wheezing.  06/02/14  Yes Calvert Cantor,  MD  losartan (COZAAR) 25 MG tablet Take 25 mg by mouth daily.   Yes Historical Provider, MD  losartan (COZAAR) 50 MG tablet Take 1 tablet (50 mg total) by mouth daily. 06/08/14  Yes Aundria Rud, NP  metoprolol succinate (TOPROL-XL) 25 MG 24 hr tablet Take 1 tablet (25 mg total) by mouth daily. 06/02/14  Yes Calvert Cantor, MD  omeprazole (PRILOSEC) 20 MG capsule Take 20 mg by mouth daily.   Yes Historical Provider, MD  rivaroxaban (XARELTO) 20 MG TABS tablet Take 1 tablet (20 mg total) by mouth daily. 03/31/14  Yes Marinus Maw, MD  torsemide (DEMADEX) 20 MG tablet Take 1 tablet (20 mg total) by mouth 2 (two) times daily. 08/28/14  Yes Marinus Maw, MD  antiseptic oral rinse (CPC / CETYLPYRIDINIUM CHLORIDE 0.05%) 0.05 % LIQD solution 7 mLs by Mouth Rinse route 2 (two) times daily. 09/21/14   Maryruth Bun Rama, MD  feeding supplement, ENSURE COMPLETE, (ENSURE COMPLETE) LIQD Take 237 mLs by mouth 2 (two) times daily between meals. 09/21/14   Maryruth Bun Rama, MD  haloperidol lactate (HALDOL) 5 MG/ML injection Inject 0.4 mLs (2 mg total) into the vein every 6 (six) hours as needed. 09/21/14   Maryruth Bun Rama, MD  ipratropium (ATROVENT) 0.02 % nebulizer solution Take 2.5 mLs (0.5 mg total) by nebulization every 6 (six) hours. 09/21/14   Christina P Rama, MD  ipratropium-albuterol (DUONEB) 0.5-2.5 (3) MG/3ML SOLN Take 3 mLs by nebulization every 2 (two) hours as needed. 09/21/14   Maryruth Bun Rama, MD  levalbuterol (XOPENEX) 0.63 MG/3ML nebulizer solution Take 3 mLs (0.63 mg total) by nebulization every 6 (six) hours. 09/21/14   Maryruth Bun Rama, MD  LORazepam (ATIVAN) 2 MG/ML injection Inject 0.5-1 mLs (1-2 mg total) into the vein every 4 (four) hours as needed for anxiety. 09/21/14   Maryruth Bun Rama, MD  morphine 250 mg in sodium chloride 0.9 % 240 mL For comfort care. 09/21/14   Maryruth Bun Rama, MD  nitroGLYCERIN (NITROSTAT) 0.4 MG SL tablet Place 1 tablet (0.4 mg total) under the tongue every 5 (five)  minutes as needed for chest pain. 08/02/13   Marinus Maw, MD  torsemide (DEMADEX) 20 MG tablet Take 1 tablet (20 mg total) by mouth daily. 09/21/14   Maryruth Bun Rama, MD   Physical Exam: Filed Vitals:   09/10/2014 1300 09/21/2014 1400 09/18/2014 1417 09/10/2014 1419  BP: 114/66 119/71    Pulse: 119 55  125  Temp:      TempSrc:      Resp:    12  Height:      Weight:      SpO2: 92% 93% 93% 90%    Wt Readings from Last 3 Encounters:  09/21/14 78.7 kg (173 lb 8 oz)  07/03/14 83.915 kg (185 lb)  06/28/14 84.823 kg (187 lb)    General: Patient is unresponsive, on BiPAP Eyes: PERRL, normal lids, irises & conjunctiva ENT: grossly normal hearing, lips & tongue Cardiovascular: S1, S2, irregular, no m/r/g. 1+ LE edema. Respiratory: Diminished  breath sounds bilaterally, some crackles at the bases Abdomen: soft, ntnd Skin: no rash or induration seen on limited exam Musculoskeletal: grossly normal tone BUE/BLE Psychiatric: Cannot assess Neurologic: Cannot assess           Labs on Admission:  Basic Metabolic Panel:  Recent Labs Lab 09/18/14 1036 09/19/14 0431 09/20/14 0440 09/21/14 0503 09/06/2014 0451  NA 139 143 147* 154* 158*  K 4.5 4.1 4.4 4.5 5.1  CL 91* 88* 91* 95* 100  CO2 44* 46* >50* 49* >50*  GLUCOSE 184* 183* 186* 194* 178*  BUN 34* 38* 45* 43* 49*  CREATININE 1.01 0.95 1.03 0.99 1.03  CALCIUM 9.1 9.2 9.4 9.5 9.4  MG 2.3 2.4 2.8* 3.0* 3.2*   Liver Function Tests: No results for input(s): AST, ALT, ALKPHOS, BILITOT, PROT, ALBUMIN in the last 168 hours. No results for input(s): LIPASE, AMYLASE in the last 168 hours. No results for input(s): AMMONIA in the last 168 hours. CBC:  Recent Labs Lab 09/16/14 0449 09/18/14 1036 09/20/14 0440 09/21/14 0503  WBC 14.5* 18.4* 13.2* 11.4*  HGB 14.1 14.3 15.2 15.4  HCT 45.1 45.4 50.8 50.8  MCV 101.3* 101.1* 104.7* 105.0*  PLT 185 177 185 183   Cardiac Enzymes: No results for input(s): CKTOTAL, CKMB, CKMBINDEX, TROPONINI  in the last 168 hours.  BNP (last 3 results)  Recent Labs  09/14/14 1513  BNP 335.0*    ProBNP (last 3 results)  Recent Labs  05/24/14 1001 05/30/14 0407 06/28/14 1202  PROBNP 2785.0* 4076.0* 392.0*    CBG: No results for input(s): GLUCAP in the last 168 hours.  Radiological Exams on Admission: Dg Chest Port 1 View  09/21/2014   CLINICAL DATA:  Respiratory failure, history of COPD, CHF, and community acquired pneumonia.  EXAM: PORTABLE CHEST - 1 VIEW  COMPARISON:  Portable chest x-ray of September 20, 2014.  FINDINGS: The left lung is adequately inflated. There are persistently increased interstitial markings. There is subtle nodular density in the left upper lobe which is stable. There is a small right pleural effusion layering posteriorly. There are confluent alveolar densities in the right mid and lower lung. The interstitial markings are increased. The cardiac silhouette is enlarged and the pulmonary vascularity is engorged. The permanent pacemaker defibrillator is unchanged in position.  IMPRESSION: CHF and pulmonary edema with small right pleural effusion. There has been mild interval deterioration since yesterday's study.   Electronically Signed   By: David  [place]   On: 09/21/2014 08:00    Assessment/Plan Principal Problem:   Acute respiratory failure with hypercapnia Active Problems:   Chronic atrial fibrillation   Hyperlipidemia   Coronary artery disease   Pleural effusion   Acute on chronic combined systolic and diastolic CHF (congestive heart failure)   COPD exacerbation   AICD (automatic cardioverter/defibrillator) present   Lung nodule, solitary   Cigarette smoker   Syncope   CAP (community acquired pneumonia)   Faintness   Congestive heart disease   Sepsis   Lactic acidosis   Elevated troponin   Leukocytosis   Hypokalemia   1. Patient is terminal due to his severe respiratory illness. His family has elected to pursue comfort care. His medications will  be simplified. He will continue with supportive care under GIP status. If the patient stabilizes, we can consider transitioning him to residential hospice. Plans are to discontinue BiPAP today and turn the patient's defibrillator off. It is quite possible that the patient may have an in-hospital death.   Code  Status: DO NOT RESUSCITATE, comfort measures only DVT Prophylaxis: None Family Communication: Discussed with patient's family Disposition Plan: possible transfer to hospice home if patient stabilizes  Time spent:  [facility] Pager [phone]

## 2014-09-22 NOTE — Care Management Utilization Note (Signed)
UR completed 

## 2014-09-23 MED ORDER — CHLORHEXIDINE GLUCONATE 0.12 % MT SOLN
15.0000 mL | Freq: Two times a day (BID) | OROMUCOSAL | Status: DC
  Administered 2014-09-23: 15 mL via OROMUCOSAL
  Filled 2014-09-23: qty 15

## 2014-09-23 MED ORDER — CETYLPYRIDINIUM CHLORIDE 0.05 % MT LIQD: 7.0000 mL | Freq: Two times a day (BID) | OROMUCOSAL | Status: DC

## 2014-09-24 NOTE — Discharge Summary (Addendum)
Death Summary  [name] Driggers ZOX:[id number] DOB: 1950-11-21 DOA: 09/13/2014  PCP: [name],ASHISH, MD   Admit date: 08/30/2014 Date of Death: 07-21-15  Final Diagnoses:  Active Problems:   CHF (congestive heart failure)  acute respiratory failure with hypoxia Acute on chronic combined systolic and diastolic congestive heart failure COPD exacerbation Community acquired pneumonia Bilateral pleural effusions Chronic atrial fibrillation Sepsis Lactic acidosis Ischemic cardiomyopathy    History of present illness:  [name] Verno is a 64 y.o. male who was admitted to the hospital with acute respiratory failure which is felt to be secondary to COPD as well as congestive heart failure. The patient was treated aggressively with intravenous steroids, bronchodilators as well as diuretics. He was followed by pulmonology and cardiology during his hospital stay. He completed 7 days of antibiotics for presumed sepsis related to community-acquired pneumonia. Despite aggressive measures, the patient overall clinical status deteriorated. He was unable to wean from BiPAP therapy and he progressively hypoxic and lethargic. After discussion with the family, decision was made to start the patient on a morphine infusion for dyspnea and transition towards comfort care. The patient is being followed by hospice services and will now be under GIP status.  Hospital Course:  Patient was admitted to the hospital under hospice care (GIP status) for end-of-life care for the above-stated conditions. He was continued on morphine infusion for comfort and received other supportive treatments.. Plans were to discharge the patient to residential hospice if his condition stabilized. On 2/27, patient passed away in the hospital.   Time: 15mins  Signed:  Nicholes Hibler  [facility] 09/24/2014, 7:25 PM

## 2014-09-26 NOTE — Progress Notes (Signed)
250 mg Morphine drip beg wasted as patient is deceased.  Wasted in sink  - verified by Engelhard CorporationMA Dickeron, RN

## 2014-09-26 NOTE — Progress Notes (Signed)
Wasted the remaining morphine drip (100mL) and Lenice LlamasJennifer Lavinder, RN witnessed.

## 2014-09-26 DEATH — deceased

## 2014-11-12 IMAGING — CR DG CHEST 2V
2 series · 2 of 2 positions shown · non-contrast
Comparison: Chest x-ray 09/15/2012.

CLINICAL DATA: Shortness of breath.  Atrial fibrillation.

CHEST - 2 VIEW

[view not recorded (1 of 2)]
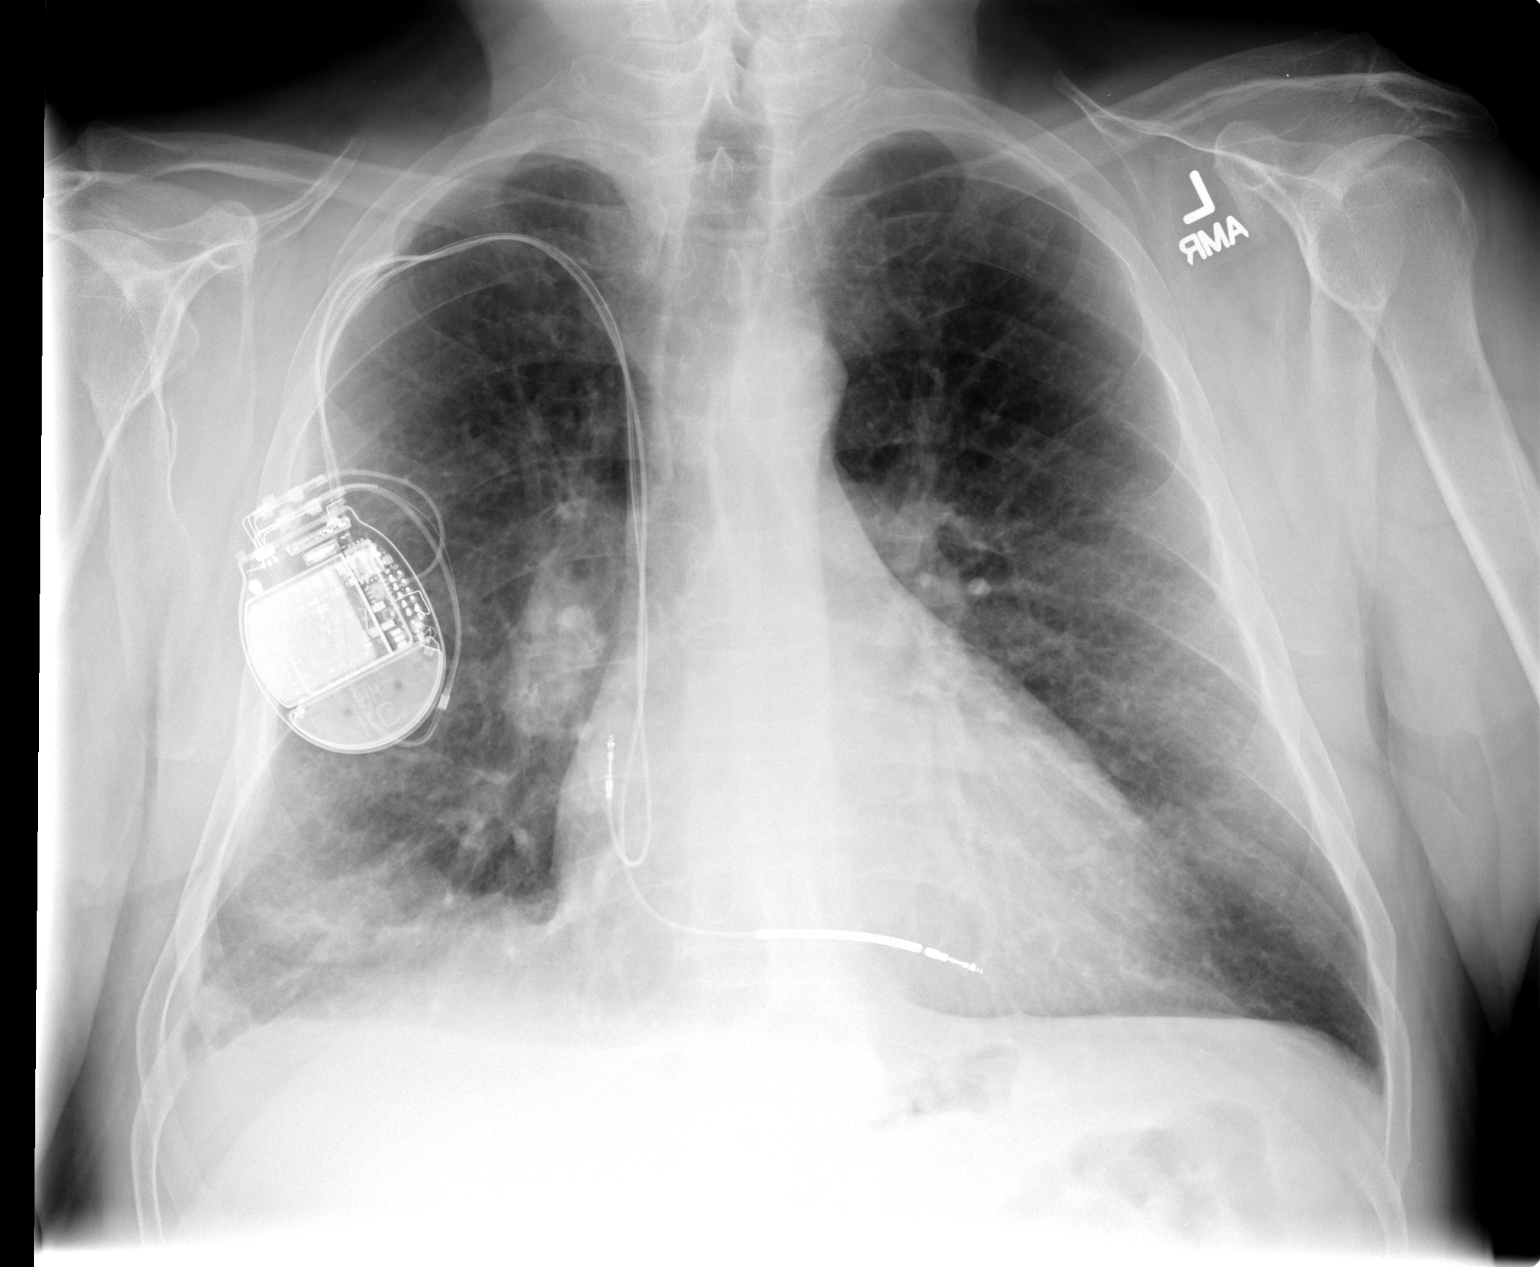

[view not recorded (2 of 2)]
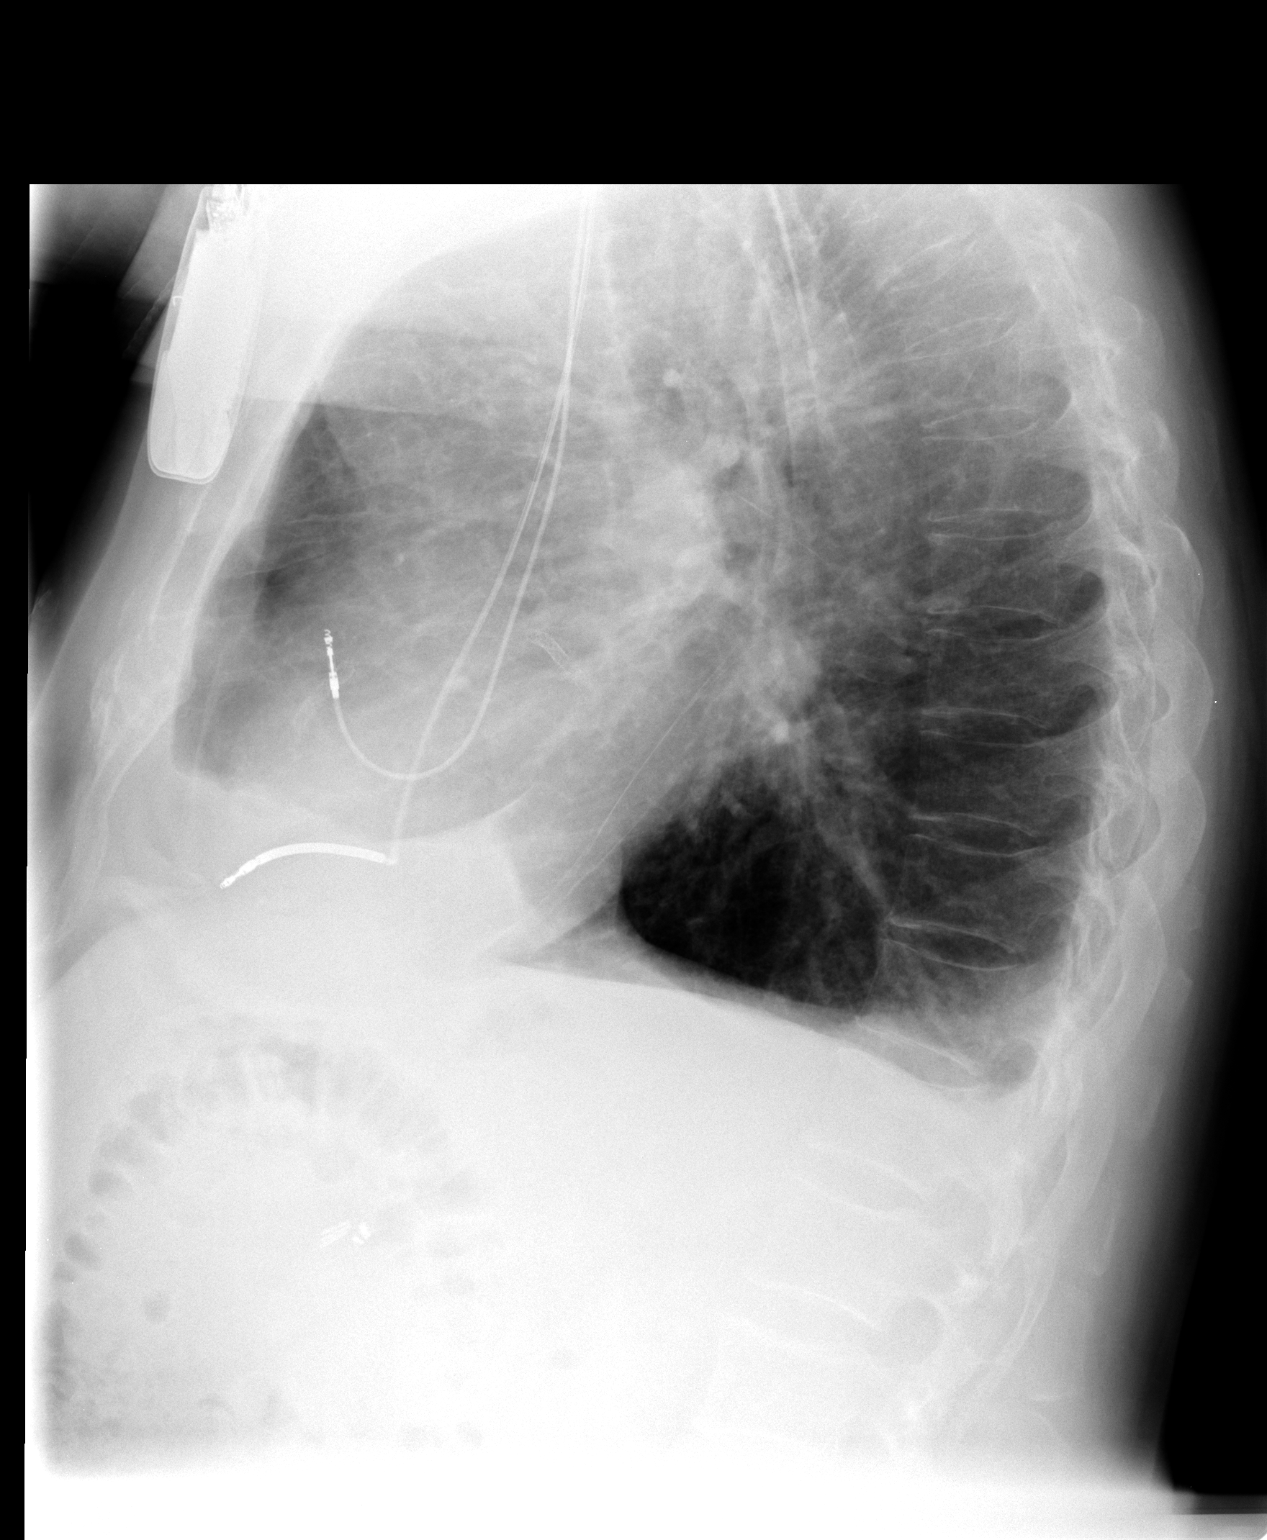

[2 of 2 positions shown; findings below may reference images not displayed]

FINDINGS: Irregular opacities in the right base may reflect areas
of atelectasis and/or consolidation in the right middle and lower
lobe.  Left lung appears clear.  No evidence of pulmonary edema.
Mild blunting of both costophrenic sulci may reflect chronic
pleural parenchymal scarring or trace bilateral pleural effusions.
Heart size appears mildly enlarged (unchanged).  Upper mediastinal
contours are unremarkable.  Right-sided pacemaker / AICD with lead
tips projecting over the expected location of the right atrium and
right ventricular apex.  At least two coronary artery stents are
noted.
IMPRESSION: 1.  Irregular opacities at the right base may reflect areas of
atelectasis and/or consolidation in the right middle and lower
lobe.
2.  Possible trace bilateral pleural effusions versus chronic
pleural parenchymal scarring.
3.  Mild cardiomegaly is unchanged.

## 2016-06-28 IMAGING — CR DG CHEST 1V PORT
1 series · 1 of 1 positions shown · non-contrast
Comparison: August 20, 2013.

CLINICAL DATA: Shortness of breath.

EXAM:
PORTABLE CHEST - 1 VIEW

[portable]
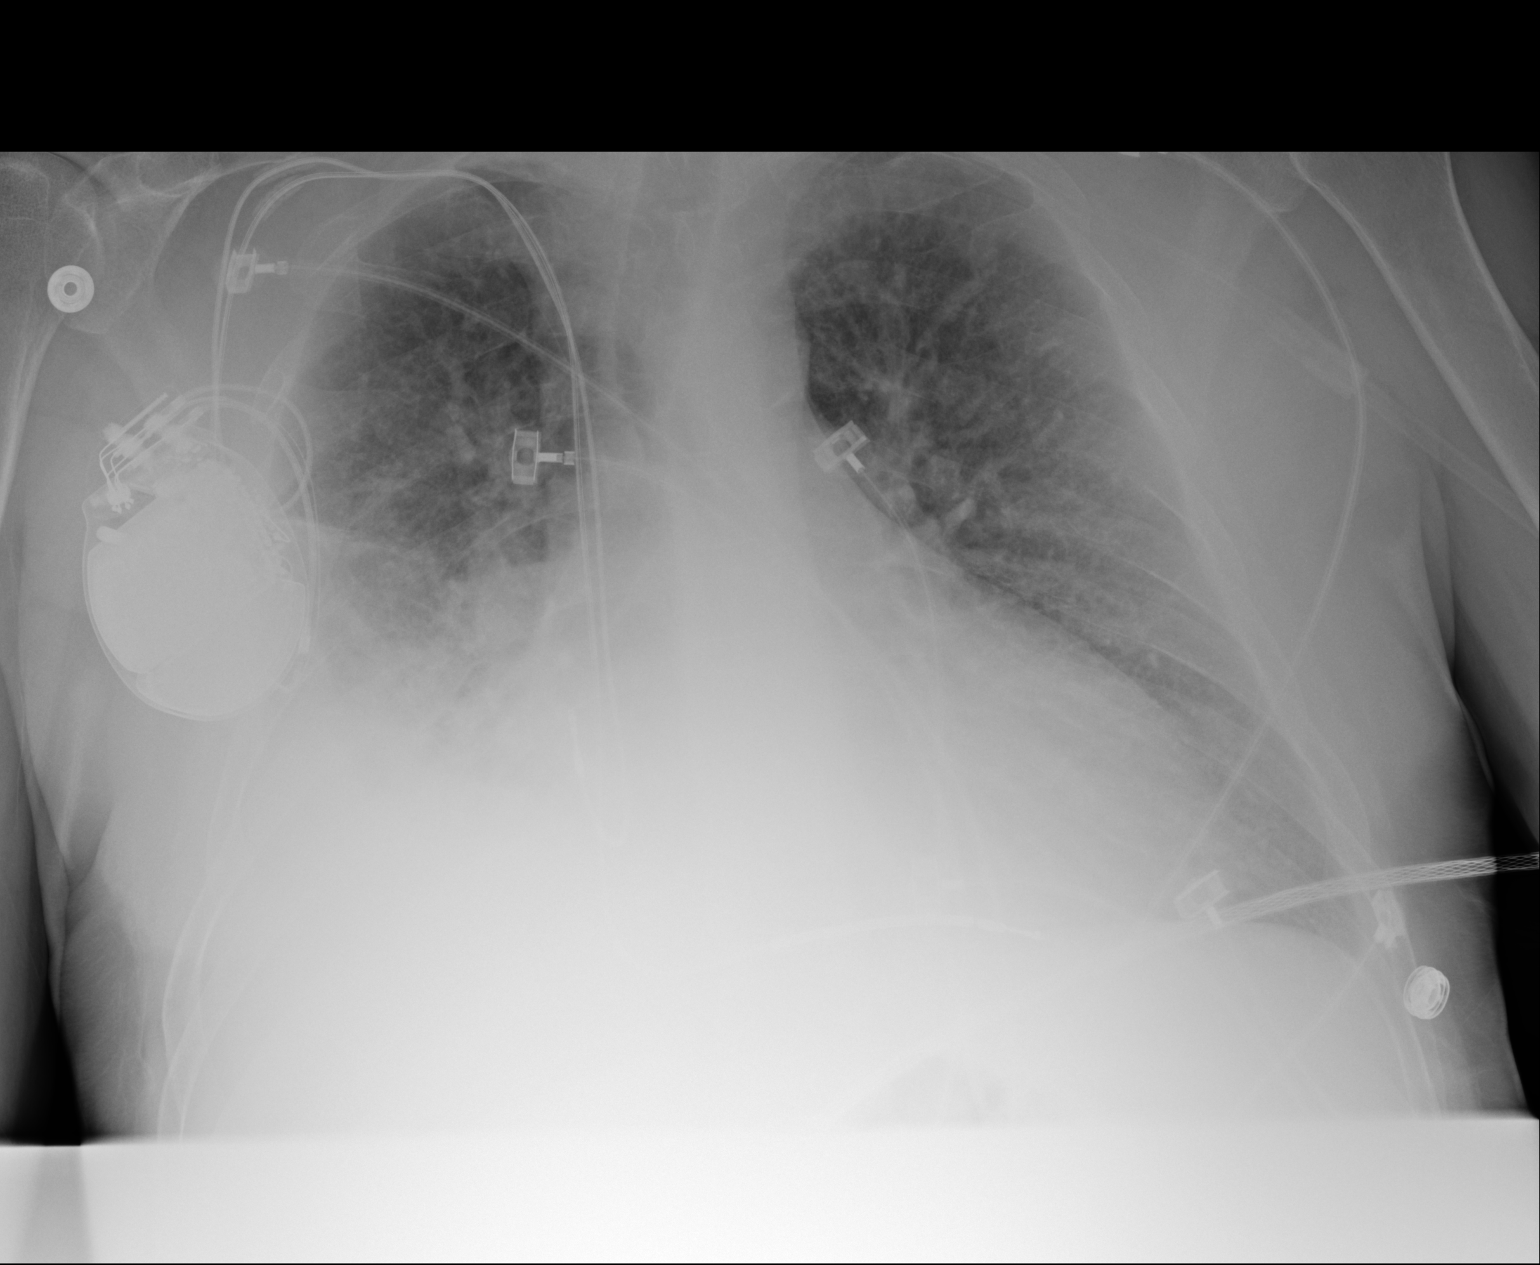

[1 of 1 positions shown; findings below may reference images not displayed]

FINDINGS: Stable cardiomegaly. Right-sided PICC line is unchanged in position.
Stable interstitial densities are noted throughout both lungs
consistent with scarring or possibly superimposed pulmonary edema.
No pneumothorax is noted. Increased right basilar opacity is noted
concerning for pneumonia or atelectasis with associated pleural
effusion.
IMPRESSION: Stable interstitial densities are noted throughout both lungs which
may simply represent chronic scarring, but superimposed edema cannot
be excluded. Increased right basilar opacity is noted concerning for
worsening pneumonia or atelectasis with associated pleural effusion.

## 2016-07-05 IMAGING — CR DG CHEST 1V PORT
1 series · 1 of 1 positions shown · non-contrast
Comparison: 05/28/2014 and 05/27/2014

CLINICAL DATA: Shortness of breath, pleural effusion.

EXAM:
PORTABLE CHEST - 1 VIEW

[AP]
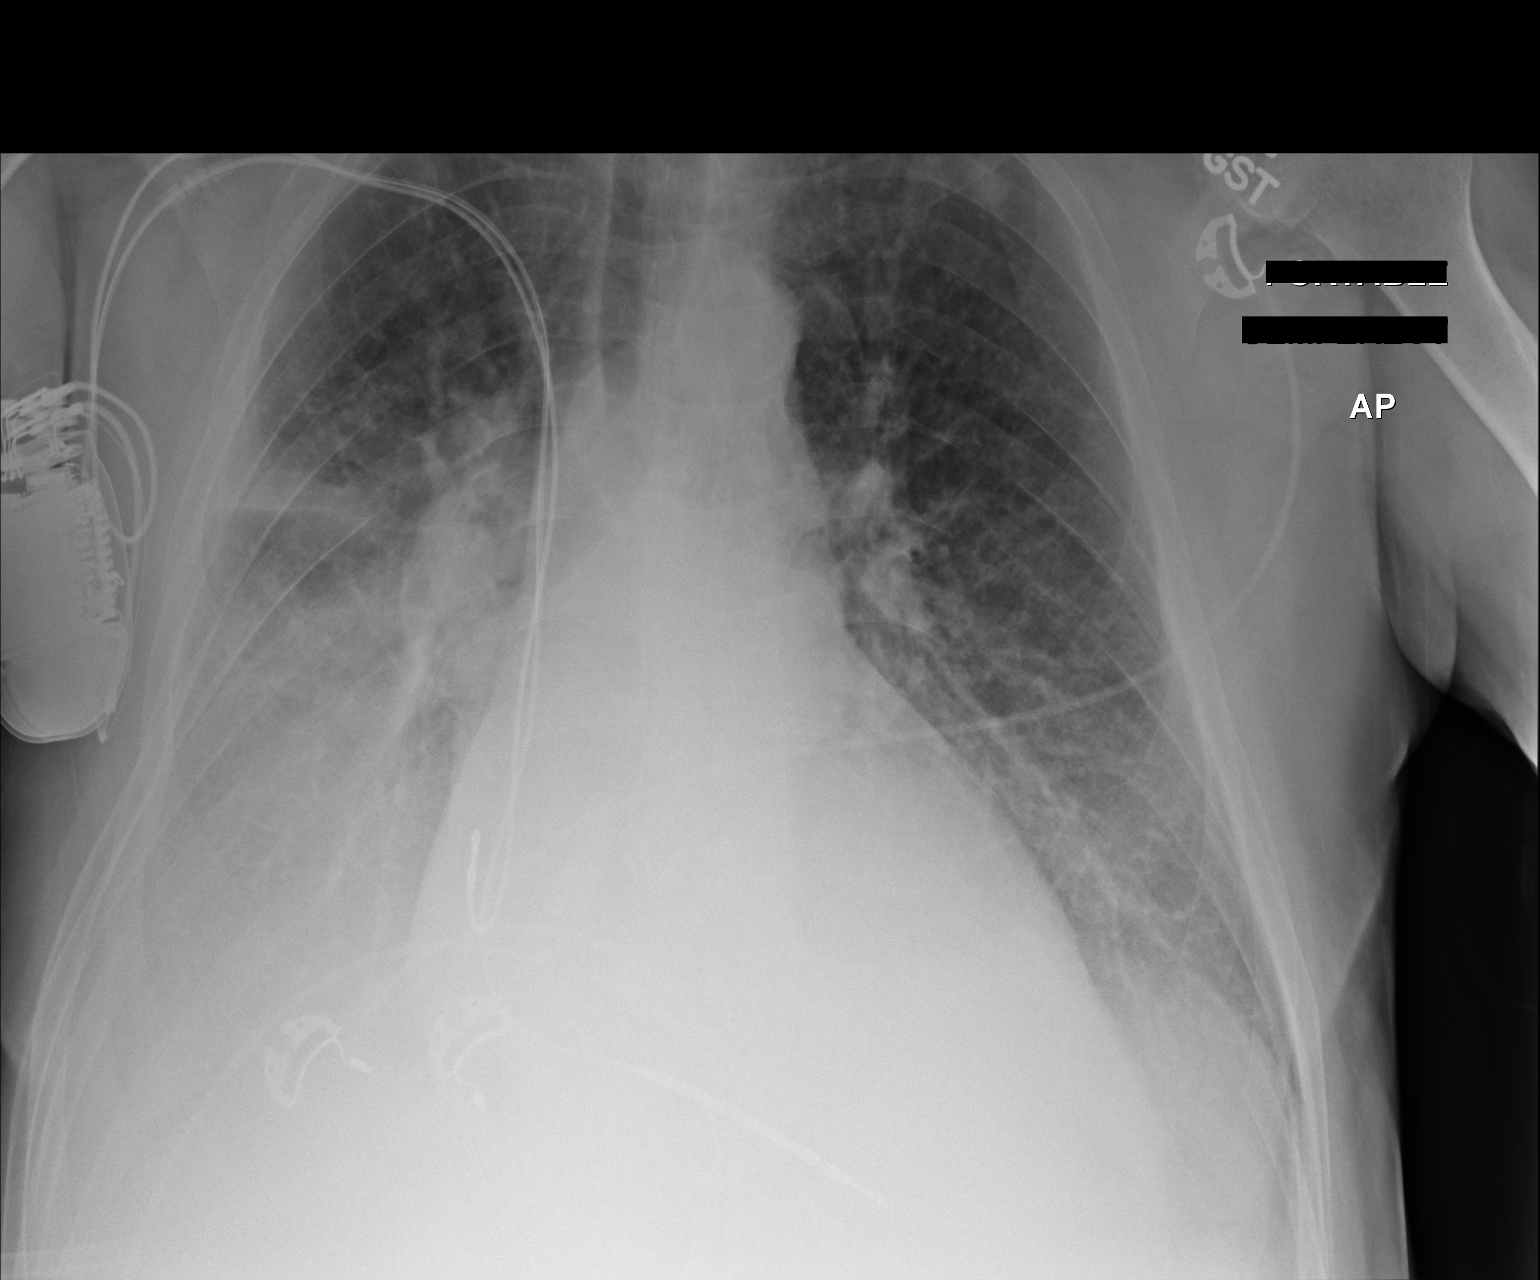

[1 of 1 positions shown; findings below may reference images not displayed]

FINDINGS: Two lead right-sided pacemaker is unchanged. Lungs are adequately
inflated and demonstrate persistent opacification over the right mid
to lower lung compatible with known moderate effusion likely with
associated atelectasis. Cannot completely exclude infection in the
right mid to lower lung. There is slightly better aeration in the
right mid to lower lung. Stable cardiomegaly. Remainder the exam is
unchanged.
IMPRESSION: Persistent opacification with slight improvement over the right mid
to lower lung compatible with known moderate size effusion and
associated atelectasis. Cannot completely exclude infection.
